# Patient Record
Sex: Male | Born: 1950 | Race: Black or African American | Hispanic: No | Marital: Married | Smoking: Never smoker
Health system: Southern US, Community
[De-identification: ages and names within clinical notes are randomized; demographics above are authoritative.]

## PROBLEM LIST (undated history)

## (undated) DIAGNOSIS — M7130 Other bursal cyst, unspecified site: Secondary | ICD-10-CM

## (undated) DIAGNOSIS — R61 Generalized hyperhidrosis: Secondary | ICD-10-CM

## (undated) DIAGNOSIS — R972 Elevated prostate specific antigen [PSA]: Secondary | ICD-10-CM

## (undated) DIAGNOSIS — N4231 Prostatic intraepithelial neoplasia: Secondary | ICD-10-CM

## (undated) DIAGNOSIS — N400 Enlarged prostate without lower urinary tract symptoms: Secondary | ICD-10-CM

## (undated) HISTORY — DX: Prostatic intraepithelial neoplasia: N42.31

## (undated) HISTORY — DX: Other bursal cyst, unspecified site: M71.30

## (undated) HISTORY — DX: Generalized hyperhidrosis: R61

## (undated) HISTORY — DX: Benign prostatic hyperplasia without lower urinary tract symptoms: N40.0

## (undated) HISTORY — DX: Elevated prostate specific antigen (PSA): R97.20

---

## 2014-06-18 ENCOUNTER — Ambulatory Visit: Payer: Self-pay | Admitting: Family Medicine

## 2014-06-18 IMAGING — CR DG CHEST 2V
1 series · 2 of 2 positions shown · non-contrast
Comparison: None.

CLINICAL DATA: Positive tuberculin test.

EXAM:
CHEST  2 VIEW

[Series 1: w chest pa · 0.14mm/px · 2 of 2 slices shown]
[im 1/2]
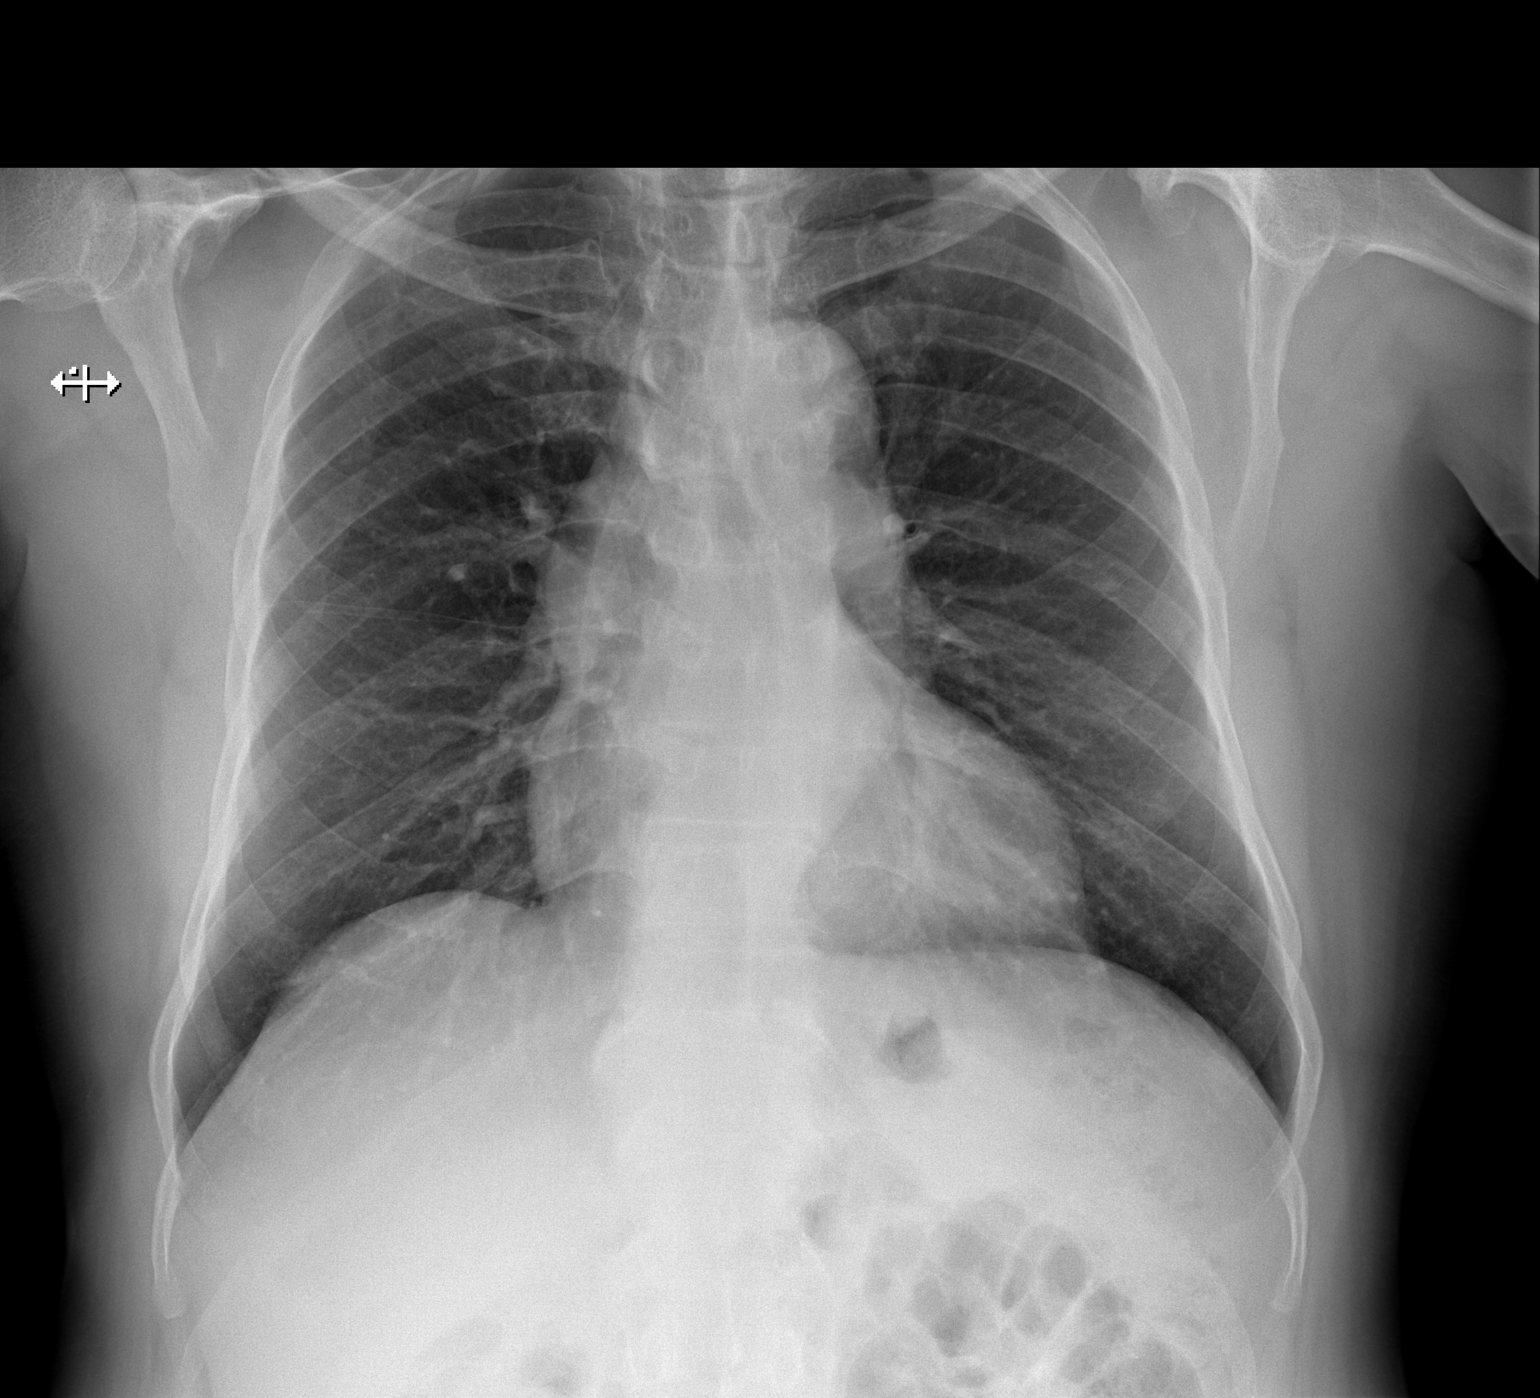
[im 2/2]
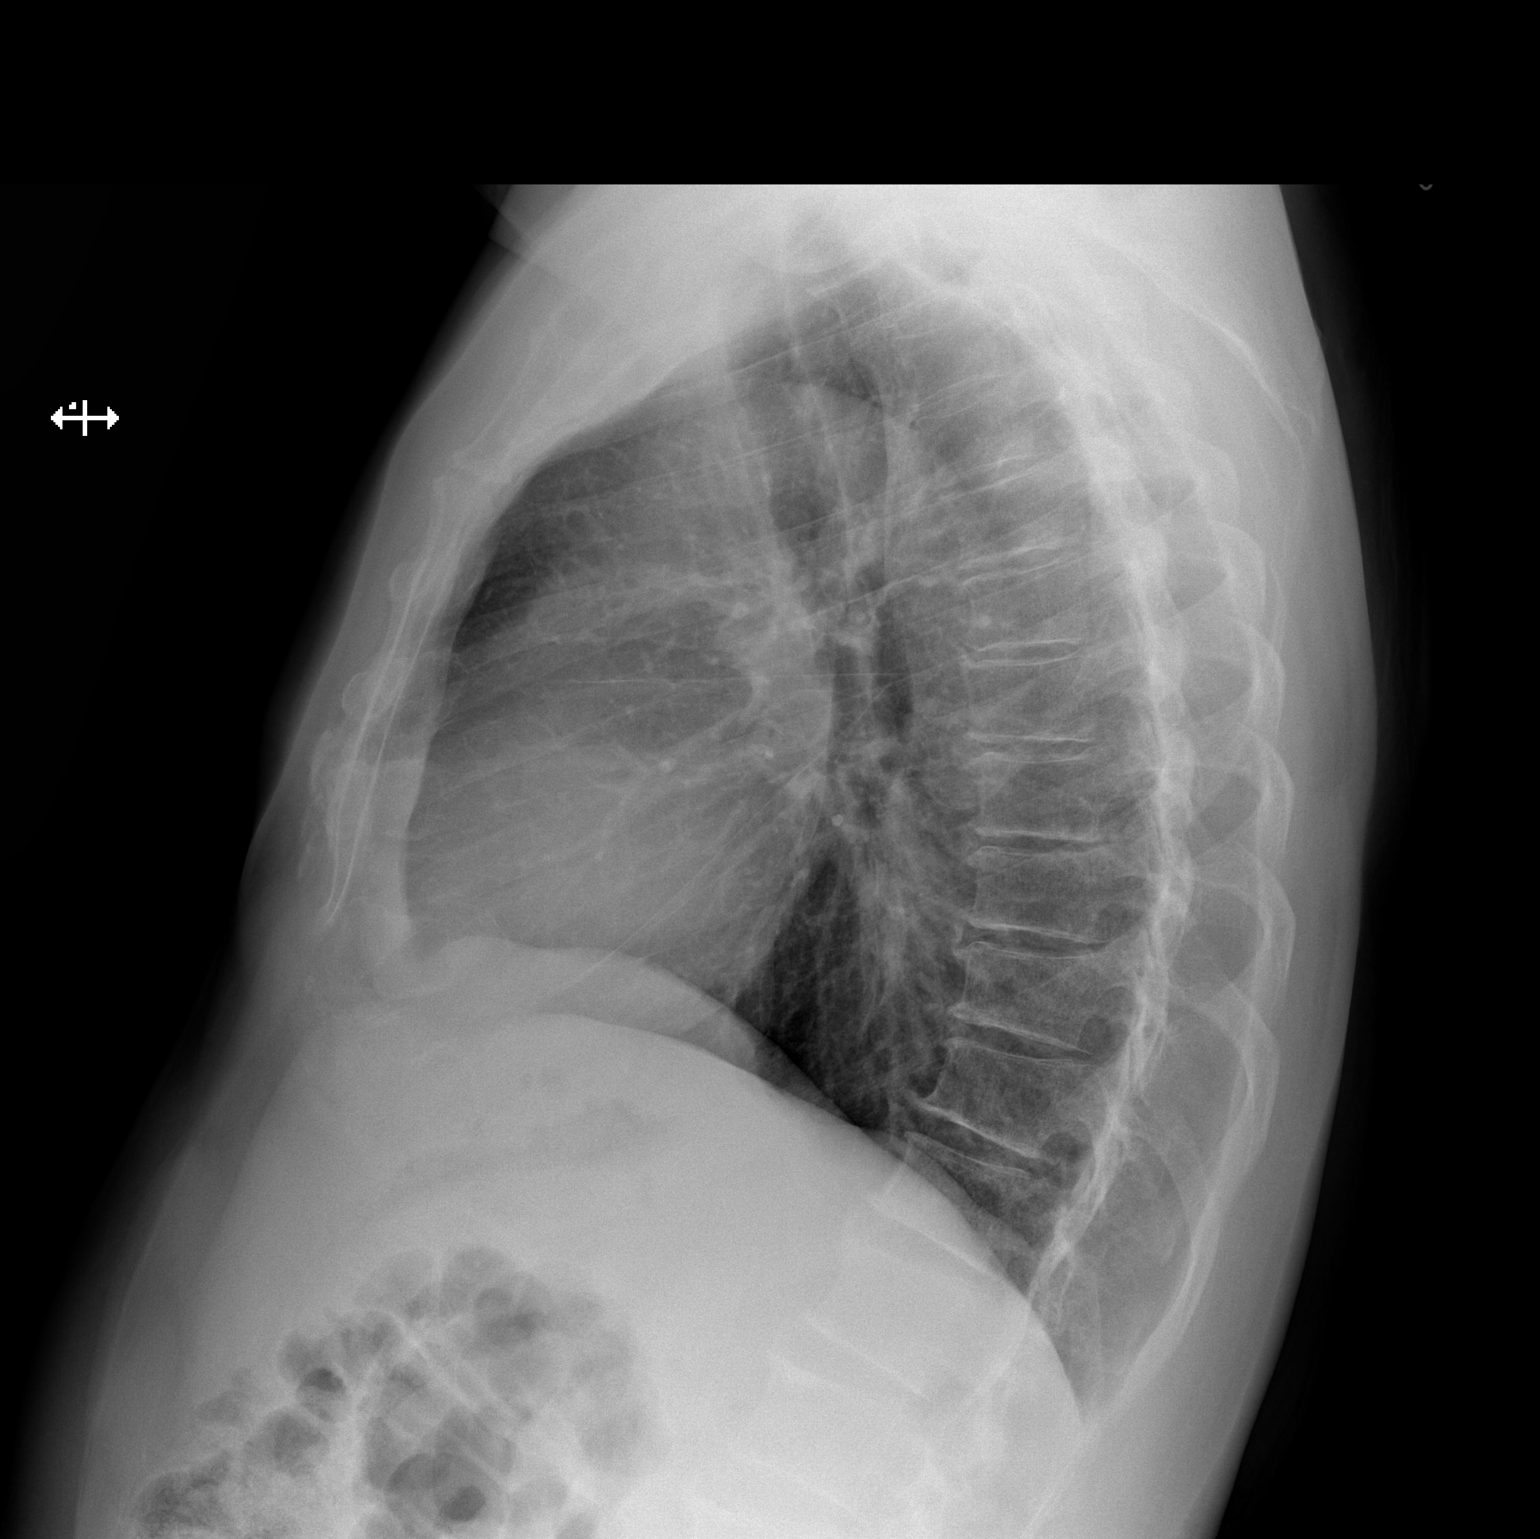

[2 of 2 positions shown; findings below may reference images not displayed]

FINDINGS: The heart size and mediastinal contours are within normal limits.
Both lungs are clear. No pneumothorax or pleural effusion is noted.
The visualized skeletal structures are unremarkable.
IMPRESSION: No acute cardiopulmonary abnormality seen.

## 2015-03-20 DIAGNOSIS — E663 Overweight: Secondary | ICD-10-CM

## 2015-03-20 DIAGNOSIS — N4231 Prostatic intraepithelial neoplasia: Secondary | ICD-10-CM | POA: Insufficient documentation

## 2015-03-20 DIAGNOSIS — R972 Elevated prostate specific antigen [PSA]: Secondary | ICD-10-CM | POA: Insufficient documentation

## 2015-05-03 ENCOUNTER — Encounter: Payer: Self-pay | Admitting: Urology

## 2015-05-03 DIAGNOSIS — M7130 Other bursal cyst, unspecified site: Secondary | ICD-10-CM | POA: Insufficient documentation

## 2015-05-03 DIAGNOSIS — A159 Respiratory tuberculosis unspecified: Secondary | ICD-10-CM | POA: Insufficient documentation

## 2015-05-03 DIAGNOSIS — E669 Obesity, unspecified: Secondary | ICD-10-CM | POA: Insufficient documentation

## 2015-05-03 DIAGNOSIS — E663 Overweight: Secondary | ICD-10-CM

## 2015-05-18 ENCOUNTER — Ambulatory Visit: Payer: 59 | Admitting: Anesthesiology

## 2015-05-18 ENCOUNTER — Encounter
Admission: RE | Disposition: A | Discharge status: Home / Self Care | Payer: Self-pay | Source: Ambulatory Visit | Attending: Gastroenterology

## 2015-05-18 ENCOUNTER — Encounter: Payer: Self-pay | Admitting: Anesthesiology

## 2015-05-18 ENCOUNTER — Ambulatory Visit
Admission: RE | Admit: 2015-05-18 | Discharge: 2015-05-18 | Disposition: A | Discharge status: Home / Self Care | Payer: 59 | Source: Ambulatory Visit | Attending: Gastroenterology | Admitting: Gastroenterology

## 2015-05-18 DIAGNOSIS — Z8611 Personal history of tuberculosis: Secondary | ICD-10-CM | POA: Insufficient documentation

## 2015-05-18 DIAGNOSIS — R600 Localized edema: Secondary | ICD-10-CM | POA: Insufficient documentation

## 2015-05-18 DIAGNOSIS — D124 Benign neoplasm of descending colon: Secondary | ICD-10-CM | POA: Diagnosis not present

## 2015-05-18 DIAGNOSIS — K640 First degree hemorrhoids: Secondary | ICD-10-CM | POA: Insufficient documentation

## 2015-05-18 DIAGNOSIS — Z1211 Encounter for screening for malignant neoplasm of colon: Secondary | ICD-10-CM | POA: Diagnosis not present

## 2015-05-18 DIAGNOSIS — K635 Polyp of colon: Secondary | ICD-10-CM

## 2015-05-18 HISTORY — PX: COLONOSCOPY: SHX5424

## 2015-05-18 SURGERY — COLONOSCOPY
Anesthesia: General

## 2015-05-18 MED ORDER — SODIUM CHLORIDE 0.9 % IV SOLN
INTRAVENOUS | Status: DC
  Administered 2015-05-18: 1000 mL via INTRAVENOUS

## 2015-05-18 MED ORDER — FENTANYL CITRATE (PF) 100 MCG/2ML IJ SOLN
INTRAMUSCULAR | Status: DC | PRN
  Administered 2015-05-18: 50 ug via INTRAVENOUS

## 2015-05-18 MED ORDER — MIDAZOLAM HCL 2 MG/2ML IJ SOLN
INTRAMUSCULAR | Status: DC | PRN
  Administered 2015-05-18: 2 mg via INTRAVENOUS

## 2015-05-18 MED ORDER — PROPOFOL INFUSION 10 MG/ML OPTIME
INTRAVENOUS | Status: DC | PRN
  Administered 2015-05-18: 140 ug/kg/min via INTRAVENOUS

## 2015-05-18 NOTE — H&P (Signed)
  [facility]  [address] [place], [place] [address] Phone: [phone] Fax : [phone]  Primary Care Physician:  Ashok Norris, MD Primary Gastroenterologist:  Dr. Allen Norris  Pre-Procedure History & Physical: HPI:  [name] Hebert a 64 y.o. male Hebert here for a screening colonoscopy.   Past Medical History  Diagnosis Date  . Tuberculosis     NONACTIVE  . Synovial cyst     History reviewed. No pertinent past surgical history.  Prior to Admission medications   Medication Sig Start Date End Date Taking? Authorizing Provider  Multiple Vitamin (MULTIVITAMIN WITH MINERALS) TABS tablet Take 1 tablet by mouth daily.   Yes Historical Provider, MD  Multiple Vitamin (MULTIVITAMIN) capsule Take 1 capsule by mouth daily.    Historical Provider, MD  Multiple Vitamins-Minerals (MENS 50+ MULTI VITAMIN/MIN) TABS Take 1 tablet by mouth daily.    Historical Provider, MD  Multiple Vitamins-Minerals (OCUVITE ADULT 50+ PO) Take by mouth.    Historical Provider, MD  Multiple Vitamins-Minerals (OCUVITE ADULT 50+) CAPS Take 1 tablet by mouth daily.    Historical Provider, MD    Allergies as of 03/29/2015 - never reviewed  Allergen Reaction Noted  . Hydroxychloroquine  03/20/2015    Family History  Problem Relation Age of Onset  . Hyperlipidemia    . Prostate cancer Brother     DECEASED    History   Social History  . Marital Status: Married    Spouse Name: N/A  . Number of Children: N/A  . Years of Education: N/A   Occupational History  . Not on file.   Social History Main Topics  . Smoking status: Never Smoker   . Smokeless tobacco: Not on file  . Alcohol Use: No  . Drug Use: Not on file  . Sexual Activity: Not on file   Other Topics Concern  . Not on file   Social History Narrative    Review of Systems: See HPI, otherwise negative ROS  Physical Exam: BP 154/72 mmHg  Pulse 51  Temp(Src) 98.3 F (36.8 C) (Tympanic)  Resp 16  Ht 5\' 10"  (1.778 m)   Wt 200 lb (90.719 kg)  BMI 28.70 kg/m2  SpO2 100% General:   Alert,  pleasant and cooperative in NAD Head:  Normocephalic and atraumatic. Neck:  Supple; no masses or thyromegaly. Lungs:  Clear throughout to auscultation.    Heart:  Regular rate and rhythm. Abdomen:  Soft, nontender and nondistended. Normal bowel sounds, without guarding, and without rebound.   Neurologic:  Alert and  oriented x4;  grossly normal neurologically.  Impression/Plan: Walter Hebert Hebert now here to undergo a screening colonoscopy.  Risks, benefits, and alternatives regarding colonoscopy have been reviewed with the patient.  Questions have been answered.  All parties agreeable.

## 2015-05-18 NOTE — Anesthesia Postprocedure Evaluation (Signed)
  Anesthesia Post-op Note  Patient: Walter Hebert  Procedure(s) Performed: Procedure(s): COLONOSCOPY (N/A)  Anesthesia type:General  Patient location: PACU  Post pain: Pain level controlled  Post assessment: Post-op Vital signs reviewed, Patient's Cardiovascular Status Stable, Respiratory Function Stable, Patent Airway and No signs of Nausea or vomiting  Post vital signs: Reviewed and stable  Last Vitals:  Filed Vitals:   05/18/15 0912  BP: 90/61  Pulse:   Temp: 35.9 C  Resp:     Level of consciousness: awake, alert  and patient cooperative  Complications: No apparent anesthesia complications

## 2015-05-18 NOTE — Transfer of Care (Signed)
Immediate Anesthesia Transfer of Care Note  Patient: Walter Hebert  Procedure(s) Performed: Procedure(s): COLONOSCOPY (N/A)  Patient Location: PACU  Anesthesia Type:General  Level of Consciousness: awake and sedated  Airway & Oxygen Therapy: Patient Spontanous Breathing  Post-op Assessment: Report given to RN and Post -op Vital signs reviewed and stable  Post vital signs: Reviewed and stable  Last Vitals:  Filed Vitals:   05/18/15 0805  BP: 154/72  Pulse: 51  Temp: 36.8 C  Resp: 16    Complications: No apparent anesthesia complications

## 2015-05-18 NOTE — Anesthesia Procedure Notes (Signed)
Performed by: Vaughan Sine Pre-anesthesia Checklist: Patient identified, Emergency Drugs available, Suction available, Patient being monitored and Timeout performed Patient Re-evaluated:Patient Re-evaluated prior to inductionOxygen Delivery Method: Nasal cannula Intubation Type: IV induction Airway Equipment and Method: Bite block

## 2015-05-18 NOTE — Anesthesia Preprocedure Evaluation (Signed)
Anesthesia Evaluation  Patient identified by MRN, date of birth, ID band Patient awake    Reviewed: Allergy & Precautions, NPO status , Patient's Chart, lab work & pertinent test results, reviewed documented beta blocker date and time   Airway Mallampati: II  TM Distance: >3 FB     Dental  (+) Partial Upper, Chipped   Pulmonary          Cardiovascular     Neuro/Psych    GI/Hepatic   Endo/Other    Renal/GU      Musculoskeletal   Abdominal   Peds  Hematology   Anesthesia Other Findings   Reproductive/Obstetrics                             Anesthesia Physical Anesthesia Plan  ASA: II  Anesthesia Plan: General   Post-op Pain Management:    Induction: Intravenous  Airway Management Planned: Nasal Cannula  Additional Equipment:   Intra-op Plan:   Post-operative Plan:   Informed Consent: I have reviewed the patients History and Physical, chart, labs and discussed the procedure including the risks, benefits and alternatives for the proposed anesthesia with the patient or authorized representative who has indicated his/her understanding and acceptance.     Plan Discussed with: CRNA  Anesthesia Plan Comments:         Anesthesia Quick Evaluation

## 2015-05-18 NOTE — Op Note (Signed)
[facility] Patient Name: Walter Hebert Procedure Date: 05/18/2015 8:52 AM MRN: [id number] Account #: [id number] Date of Birth: August 13, 1951 Admit Type: Outpatient Age: 64 Room: [facility] ENDO ROOM 4 Gender: Male Note Status: Finalized Procedure:         Colonoscopy Indications:       Screening for colorectal malignant neoplasm Providers:         Lucilla Lame, MD Referring MD:      Ashok Norris, MD (Referring MD) Medicines:         Propofol per Anesthesia Complications:     No immediate complications. Procedure:         Pre-Anesthesia Assessment:                    - Prior to the procedure, a History and Physical was                     performed, and patient medications and allergies were                     reviewed. The patient's tolerance of previous anesthesia                     was also reviewed. The risks and benefits of the procedure                     and the sedation options and risks were discussed with the                     patient. All questions were answered, and informed consent                     was obtained. Prior Anticoagulants: The patient has taken                     no previous anticoagulant or antiplatelet agents. ASA                     Grade Assessment: II - A patient with mild systemic                     disease. After reviewing the risks and benefits, the                     patient was deemed in satisfactory condition to undergo                     the procedure.                    After obtaining informed consent, the colonoscope was                     passed under direct vision. Throughout the procedure, the                     patient's blood pressure, pulse, and oxygen saturations                     were monitored continuously. The Colonoscope was                     introduced through the anus and advanced to the the cecum,  identified by appendiceal orifice and ileocecal valve. The          colonoscopy was performed without difficulty. The patient                     tolerated the procedure well. The quality of the bowel                     preparation was excellent. Findings:      The perianal and digital rectal examinations were normal.      A 4 mm polyp was found in the descending colon. The polyp was sessile.       The polyp was removed with a cold biopsy forceps. Resection and       retrieval were complete.      Non-bleeding internal hemorrhoids were found during retroflexion. The       hemorrhoids were Grade I (internal hemorrhoids that do not prolapse). Impression:        - One 4 mm polyp in the descending colon. Resected and                     retrieved.                    - Non-bleeding internal hemorrhoids. Recommendation:    - Repeat colonoscopy in 5 years if polyp adenoma and 10                     years if hyperplastic Procedure Code(s): --- Professional ---                    [phone], Colonoscopy, flexible; with biopsy, single or                     multiple Diagnosis Code(s): --- Professional ---                    Z12.11, Encounter for screening for malignant neoplasm of                     colon                    D12.4, Benign neoplasm of descending colon CPT copyright 2014 American Medical Association. All rights reserved. The codes documented in this report are preliminary and upon coder review may  be revised to meet current compliance requirements. Lucilla Lame, MD 05/18/2015 9:10:02 AM This report has been signed electronically. Number of Addenda: 0 Note Initiated On: 05/18/2015 8:52 AM Scope Withdrawal Time: 0 hours 7 minutes 16 seconds  Total Procedure Duration: 0 hours 10 minutes 9 seconds       [facility]

## 2015-05-19 ENCOUNTER — Ambulatory Visit: Payer: Self-pay | Admitting: Urology

## 2015-05-20 ENCOUNTER — Encounter: Payer: Self-pay | Admitting: Gastroenterology

## 2015-05-20 LAB — SURGICAL PATHOLOGY

## 2015-05-21 DIAGNOSIS — K635 Polyp of colon: Secondary | ICD-10-CM

## 2015-05-26 ENCOUNTER — Telehealth: Payer: Self-pay

## 2015-05-26 NOTE — Telephone Encounter (Signed)
-----   Message from Lucilla Lame, MD sent at 05/21/2015 11:27 AM EDT ----- The patient of the polyp was not precancerous and he will need a repeat colonoscopy in 10 years or sooner if having any rectal bleeding or any other GI problems.

## 2015-05-26 NOTE — Telephone Encounter (Signed)
Pt notified of results

## 2015-06-23 ENCOUNTER — Encounter: Payer: Self-pay | Admitting: Urology

## 2015-06-23 ENCOUNTER — Ambulatory Visit (INDEPENDENT_AMBULATORY_CARE_PROVIDER_SITE_OTHER): Payer: Self-pay | Admitting: Urology

## 2015-06-23 VITALS — BP 118/74 | HR 62 | Ht 70.0 in | Wt 204.8 lb

## 2015-06-23 DIAGNOSIS — R972 Elevated prostate specific antigen [PSA]: Secondary | ICD-10-CM

## 2015-06-23 DIAGNOSIS — N4231 Prostatic intraepithelial neoplasia: Secondary | ICD-10-CM

## 2015-06-23 DIAGNOSIS — N423 Dysplasia of prostate: Secondary | ICD-10-CM

## 2015-06-23 LAB — BLADDER SCAN AMB NON-IMAGING: SCAN RESULT: 0

## 2015-06-23 NOTE — Progress Notes (Signed)
06/23/2015 9:08 AM   Byrant A Hebert 07/28/1951 [id number]  Referring provider: Ashok Norris, MD [address] [place], [place] [address]  Chief Complaint  Patient presents with  . Elevated PSA    follow up    HPI: Mr. Walter Hebert is a 64 year old African male who has a history of elevated PSA and HGPIN.  He presents today for a 6 month follow up.  Patient underwent prostate biopsy on 04/27/2014 for a PSA of 8.3 ng/mL and was found to have a 39 gram prostate and two cores positive for HGPIN.   Today, his IPSS score is 2/1, which is mild symptomatology. His PVR is 0 mL.  He specifically denies any dysuria, suprapubic pain or gross hematuria. He does not have any recent episodes of fevers, chills, nausea or vomiting.  PSA History:    8.3 ng/mL on 12/25/2013    5.2 ng/mL on 11/02/2014      IPSS      06/23/15 0800       International Prostate Symptom Score   How often have you had the sensation of not emptying your bladder? Not at All     How often have you had to urinate less than every two hours? Not at All     How often have you found you stopped and started again several times when you urinated? Not at All     How often have you found it difficult to postpone urination? Less than 1 in 5 times     How often have you had a weak urinary stream? Not at All     How often have you had to strain to start urination? Not at All     How many times did you typically get up at night to urinate? 1 Time     Total IPSS Score 2     Quality of Life due to urinary symptoms   If you were to spend the rest of your life with your urinary condition just the way it is now how would you feel about that? Pleased        Score:  1-7 Mild 8-19 Moderate 20-35 Severe  PMH: Past Medical History  Diagnosis Date  . Tuberculosis     NONACTIVE  . Synovial cyst     Surgical History: Past Surgical History  Procedure Laterality Date  . Colonoscopy N/A 05/18/2015    Procedure:  COLONOSCOPY;  Surgeon: Lucilla Lame, MD;  Location: [facility] ENDOSCOPY;  Service: Endoscopy;  Laterality: N/A;    Home Medications:    Medication List       This list is accurate as of: 06/23/15  9:08 AM.  Always use your most recent med list.               Biotin 1 MG Caps  Take by mouth.     multivitamin capsule  Take 1 capsule by mouth daily.     multivitamin with minerals Tabs tablet  Take 1 tablet by mouth daily.     OCUVITE ADULT 50+ PO  Take by mouth.     MENS 50+ MULTI VITAMIN/MIN Tabs  Take 1 tablet by mouth daily.     OCUVITE ADULT 50+ Caps  Take 1 tablet by mouth daily.     omeprazole 40 MG capsule  Commonly known as:  PRILOSEC  Take by mouth.     triamcinolone cream 0.1 %  Commonly known as:  KENALOG  Apply topically.  Allergies:  Allergies  Allergen Reactions  . Chloroquine Itching  . Hydroxychloroquine Sulfate Hives    Family History: Family History  Problem Relation Age of Onset  . Hyperlipidemia    . Prostate cancer Brother     DECEASED  . Kidney disease Neg Hx     Social History:  reports that he has quit smoking. He does not have any smokeless tobacco history on file. He reports that he does not drink alcohol or use illicit drugs.  ROS: Urological Symptom Review  Patient is experiencing the following symptoms: Hard to postpone urination Get up at night to urinate   Review of Systems  Gastrointestinal (upper)  : Negative for upper GI symptoms  Gastrointestinal (lower) : Negative for lower GI symptoms  Constitutional : Negative for symptoms  Skin: Negative for skin symptoms  Eyes: Negative for eye symptoms  Ear/Nose/Throat : Sinus problems  Hematologic/Lymphatic: Negative for Hematologic/Lymphatic symptoms  Cardiovascular : Negative for cardiovascular symptoms  Respiratory : Negative for respiratory symptoms  Endocrine: Negative for endocrine symptoms  Musculoskeletal: Negative for musculoskeletal  symptoms  Neurological: Negative for neurological symptoms  Psychologic: Negative for psychiatric symptoms   Physical Exam: BP 118/74 mmHg  Pulse 62  Ht 5\' 10"  (1.778 m)  Wt 204 lb 12.8 oz (92.897 kg)  BMI 29.39 kg/m2  GU: Patient with circumcised phallus.  Urethral meatus is patent.  No penile discharge. No penile lesions or rashes. Scrotum without lesions, cysts, rashes and/or edema.  Testicles are located scrotally bilaterally. No masses are appreciated in the testicles. Left and right epididymis are normal. Rectal: Patient with  normal sphincter tone. Perineum without scarring or rashes. No rectal masses are appreciated. Prostate is approximately 60 grams, no nodules are appreciated. Seminal vesicles are normal.   Laboratory Data: Results for orders placed or performed in visit on 06/23/15  BLADDER SCAN AMB NON-IMAGING  Result Value Ref Range   Scan Result 0    No results found for: WBC, HGB, HCT, MCV, PLT  No results found for: CREATININE  No results found for: PSA  No results found for: TESTOSTERONE  No results found for: HGBA1C  Urinalysis No results found for: COLORURINE, APPEARANCEUR, LABSPEC, PHURINE, GLUCOSEU, HGBUR, BILIRUBINUR, KETONESUR, PROTEINUR, UROBILINOGEN, NITRITE, LEUKOCYTESUR  Pertinent Imaging:   Assessment & Plan:    1. Elevated PSA:   Patient with elevated PSA's.  We will continue to monitor closely. He'll return in 6 months for IPS S score, DRE, PVR and PSA.  - PSA   - BLADDER SCAN AMB NON-IMAGING   2. 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  PSA at time of biopsy was 8.3 ng/mL. We will continue to monitor closely. He'll return in 6 months for PSA and DRE.  No Follow-up on file.  Zara Council, [facility] [address] [place], [place] [address] [phone]

## 2015-06-24 ENCOUNTER — Telehealth: Payer: Self-pay

## 2015-06-24 DIAGNOSIS — R972 Elevated prostate specific antigen [PSA]: Secondary | ICD-10-CM

## 2015-06-24 LAB — PSA: PROSTATE SPECIFIC AG, SERUM: 6.3 ng/mL — AB (ref 0.0–4.0)

## 2015-06-24 NOTE — Telephone Encounter (Signed)
Spoke with pt in reference to PSA. Pt voiced understanding. Pt is coming 09/22/15 @8 :30 for blood draw. Cw,lpn

## 2015-06-24 NOTE — Telephone Encounter (Signed)
-----   Message from Nori Riis, PA-C sent at 06/24/2015  8:29 AM EDT ----- [name] is elevated.  I would like to repeat it again in 3 months.

## 2015-09-22 ENCOUNTER — Other Ambulatory Visit: Payer: 59

## 2015-09-22 DIAGNOSIS — R972 Elevated prostate specific antigen [PSA]: Secondary | ICD-10-CM

## 2015-09-23 ENCOUNTER — Telehealth: Payer: Self-pay

## 2015-09-23 LAB — PSA: PROSTATE SPECIFIC AG, SERUM: 4.5 ng/mL — AB (ref 0.0–4.0)

## 2015-09-23 NOTE — Telephone Encounter (Signed)
-----   Message from Nori Riis, PA-C sent at 09/23/2015  8:22 AM EDT ----- [name] has decreased.  He has an appointment in January and will recheck again at that time.

## 2015-09-23 NOTE — Telephone Encounter (Signed)
Spoke with pt in reference to PSA results. Pt voiced understanding.  

## 2015-12-17 ENCOUNTER — Other Ambulatory Visit: Payer: 59

## 2015-12-17 DIAGNOSIS — R972 Elevated prostate specific antigen [PSA]: Secondary | ICD-10-CM

## 2015-12-18 LAB — PSA: Prostate Specific Ag, Serum: 4.5 ng/mL — ABNORMAL HIGH (ref 0.0–4.0)

## 2015-12-24 ENCOUNTER — Encounter: Payer: Self-pay | Admitting: Urology

## 2015-12-24 ENCOUNTER — Ambulatory Visit (INDEPENDENT_AMBULATORY_CARE_PROVIDER_SITE_OTHER): Payer: BLUE CROSS/BLUE SHIELD | Admitting: Urology

## 2015-12-24 VITALS — BP 138/81 | HR 62 | Ht 70.0 in | Wt 203.3 lb

## 2015-12-24 DIAGNOSIS — N4231 Prostatic intraepithelial neoplasia: Secondary | ICD-10-CM

## 2015-12-24 DIAGNOSIS — N138 Other obstructive and reflux uropathy: Secondary | ICD-10-CM | POA: Insufficient documentation

## 2015-12-24 DIAGNOSIS — N401 Enlarged prostate with lower urinary tract symptoms: Secondary | ICD-10-CM

## 2015-12-24 DIAGNOSIS — R972 Elevated prostate specific antigen [PSA]: Secondary | ICD-10-CM

## 2015-12-24 MED ORDER — FINASTERIDE 5 MG PO TABS: 5.0000 mg | ORAL_TABLET | Freq: Every day | ORAL | Status: DC

## 2015-12-24 MED ORDER — TAMSULOSIN HCL 0.4 MG PO CAPS: 0.4000 mg | ORAL_CAPSULE | Freq: Every day | ORAL | Status: DC

## 2015-12-24 NOTE — Progress Notes (Signed)
8:51 AM   Walter Hebert 04/25/1951 [id number]  Referring provider: Ashok Norris, MD [address] [place], [place] [address]  Chief Complaint  Patient presents with  . Elevated PSA    6 month follow up    HPI: Patient is a 65 year old [place] male  with a history of elevated PSA, HGPIN and BPH with LUTS who presents today for his 6 month follow up.    Elevated PSA [name] was found to be 8.3 on 12/25/2013.  His most current PSA is 4.5 on 12/17/2015.  His PSA has reduced without intervention or medications.  His brother was diagnosed with fatal prostate cancer.    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   BPH WITH LUTS His IPSS score today is 5, which is mild lower urinary tract symptomatology. He is mixed with his quality life due to his urinary symptoms.   His previous IPSS score was 2/1 in 06/2015.   His major complaint today urgency of urination.  He has had this symptom for one year.  He denies any dysuria, hematuria or suprapubic pain.   He also denies any recent fevers, chills, nausea or vomiting.  He has a family history of PCa, with his brother with fatal prostate cancer.         IPSS      12/24/15 0800       International Prostate Symptom Score   How often have you had the sensation of not emptying your bladder? Not at All     How often have you had to urinate less than every two hours? Less than 1 in 5 times     How often have you found you stopped and started again several times when you urinated? Not at All     How often have you found it difficult to postpone urination? About half the time     How often have you had a weak urinary stream? Not at All     How often have you had to strain to start urination? Not at All     How many times did you typically get up at night to urinate? 1 Time     Total IPSS Score 5     Quality of Life  due to urinary symptoms   If you were to spend the rest of your life with your urinary condition just the way it is now how would you feel about that? Mixed        Score:  1-7 Mild 8-19 Moderate 20-35 Severe       PMH: Past Medical History  Diagnosis Date  . Tuberculosis     NONACTIVE  . Synovial cyst   . High grade prostatic intraepithelial neoplasia     found in two cores on biopsy may 2015  . Overweight   . Elevated PSA   . Enlarged prostate     Surgical History: Past Surgical History  Procedure Laterality Date  . Colonoscopy N/A 05/18/2015    Procedure: COLONOSCOPY;  Surgeon: Lucilla Lame, MD;  Location: [facility] ENDOSCOPY;  Service: Endoscopy;  Laterality: N/A;    Home Medications:    Medication List       This list is accurate as of: 12/24/15  8:51 AM.  Always use your most recent med list.  Biotin 1 MG Caps  Take by mouth. Reported on 12/24/2015     finasteride 5 MG tablet  Commonly known as:  PROSCAR  Take 1 tablet (5 mg total) by mouth daily.     multivitamin capsule  Take 1 capsule by mouth daily. Reported on 12/24/2015     omeprazole 40 MG capsule  Commonly known as:  PRILOSEC  Take by mouth. Reported on 12/24/2015     tamsulosin 0.4 MG Caps capsule  Commonly known as:  FLOMAX  Take 1 capsule (0.4 mg total) by mouth daily.     triamcinolone cream 0.1 %  Commonly known as:  KENALOG  Apply topically. Reported on 12/24/2015     VITAMIN C PO  Take by mouth.     VITAMIN E PO  Take by mouth.        Allergies:  Allergies  Allergen Reactions  . Chloroquine Itching  . Hydroxychloroquine Sulfate Hives    Family History: Family History  Problem Relation Age of Onset  . Hyperlipidemia    . Prostate cancer Brother     DECEASED  . Kidney disease Neg Hx     Social History:  reports that he has quit smoking. He does not have any smokeless tobacco history on file. He reports that he does not drink alcohol or use illicit  drugs.  ROS: Urological Symptom Review  Patient is experiencing the following symptoms: Hard to postpone urination Get up at night to urinate   Review of Systems  Gastrointestinal (upper)  : Negative for upper GI symptoms  Gastrointestinal (lower) : Negative for lower GI symptoms  Constitutional : Negative for symptoms  Skin: Negative for skin symptoms  Eyes: Negative for eye symptoms  Ear/Nose/Throat : Sinus problems  Hematologic/Lymphatic: Negative for Hematologic/Lymphatic symptoms  Cardiovascular : Negative for cardiovascular symptoms  Respiratory : Negative for respiratory symptoms  Endocrine: Negative for endocrine symptoms  Musculoskeletal: Negative for musculoskeletal symptoms  Neurological: Negative for neurological symptoms  Psychologic: Negative for psychiatric symptoms   Physical Exam: BP 138/81 mmHg  Pulse 62  Ht 5\' 10"  (1.778 m)  Wt 203 lb 4.8 oz (92.216 kg)  BMI 29.17 kg/m2  GU: Patient with circumcised phallus.  Urethral meatus is patent.  No penile discharge. No penile lesions or rashes. Scrotum without lesions, cysts, rashes and/or edema.  Testicles are located scrotally bilaterally. No masses are appreciated in the testicles. Left and right epididymis are normal. Rectal: Patient with  normal sphincter tone. Perineum without scarring or rashes. No rectal masses are appreciated. Prostate is approximately 60 grams, no nodules are appreciated. Seminal vesicles are normal.   Laboratory Data: PSA History:    8.3 ng/mL on 12/25/2013    5.2 ng/mL on 11/02/2014   Results for orders placed or performed in visit on 12/17/15  PSA  Result Value Ref Range   Prostate Specific Ag, Serum 4.5 (H) 0.0 - 4.0 ng/mL   Lab Results  Component Value Date   PSA 4.5* 12/17/2015   PSA 4.5* 09/22/2015   PSA 6.3* 06/23/2015    Assessment & Plan:    1. Elevated PSA:   Patient with elevated PSA's.  We will continue to monitor closely. He'll return  in 3 months for a PSA.  2. 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  PSA at time of biopsy was 8.3 ng/mL. We will continue to monitor closely. He'll return in 3 months for PSA.  3. BPH with LUTS:  Patient's IPSS score is 5/3.   I discussed the treatment options for BPH, such as: observation over time with prn avoidance of alcohol/caffeine; medical treatment or TURP.  Patient like to pursue medical treatment.  I will start tamsulosin and finasteride.  He is advised to take tamsulosin  30 minutes after a meal.  I advised him of the side effects, such as: retrograde ejaculation, sinus congestion, nasal congestion, rhinorrhea, rhinitis, dizziness, and seasonal allergic rhinitis.  The side effects of finasteride are also discussed with the patient, such as: impotence, loss of interest in sex, or trouble having an orgasm; abnormal ejaculation; swelling in his hands and/or feet; swelling and/or tenderness in his breasts.  He will follow up in 3 months for a PSA, PVR and an IPSS.    Return in about 3 months (around 03/23/2016) for IPSS and PVR.  Zara Council, [facility] [address] [place], [place] [address] [phone]

## 2016-01-12 ENCOUNTER — Ambulatory Visit (INDEPENDENT_AMBULATORY_CARE_PROVIDER_SITE_OTHER): Payer: BLUE CROSS/BLUE SHIELD | Admitting: Urology

## 2016-01-12 ENCOUNTER — Encounter: Payer: Self-pay | Admitting: Urology

## 2016-01-12 VITALS — BP 111/72 | HR 64 | Ht 70.0 in | Wt 204.5 lb

## 2016-01-12 DIAGNOSIS — G479 Sleep disorder, unspecified: Secondary | ICD-10-CM | POA: Diagnosis not present

## 2016-01-12 LAB — URINALYSIS, COMPLETE
Bilirubin, UA: NEGATIVE
Glucose, UA: NEGATIVE
Ketones, UA: NEGATIVE
Nitrite, UA: NEGATIVE
PH UA: 5.5 (ref 5.0–7.5)
Specific Gravity, UA: 1.025 (ref 1.005–1.030)
UUROB: 0.2 mg/dL (ref 0.2–1.0)

## 2016-01-12 LAB — MICROSCOPIC EXAMINATION: Epithelial Cells (non renal): NONE SEEN /hpf (ref 0–10)

## 2016-01-15 DIAGNOSIS — G4700 Insomnia, unspecified: Secondary | ICD-10-CM | POA: Insufficient documentation

## 2016-01-15 NOTE — Progress Notes (Signed)
10:22 PM   Walter Hebert Mar 08, 1951 [id number]  Referring provider: Ashok Norris, MD [address] [place], [place] [address]  Chief Complaint  Patient presents with  . Medication Reaction    Patient states can't sleep thinks its the Finasteride    HPI: Patient is a 65 year old [place] male  who was started on finasteride at his last visit approximately 2 weeks ago and is finding it difficult to sleep. He is wondering if it could be the finasteride.  Patient states that initially when he started the finasteride he was being awakened by the urge to urinate. That has since passed and now he is finding it difficult to fall asleep.  He lays in bed for 2 hours before drifting off to sleep. He then sleeps for 2-3 hours and wakes up.  The urge to urinate is no longer causing him to wake up.  His UA is unremarkable on today's exam.  Elevated PSA [name] was found to be 8.3 on 12/25/2013.  His most current PSA is 4.5 on 12/17/2015.  His PSA has reduced without intervention or medications.  His brother was diagnosed with fatal prostate cancer.    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   BPH WITH LUTS His IPSS score today is 5, which is mild lower urinary tract symptomatology. He is mixed with his quality life due to his urinary symptoms.   His previous IPSS score was 2/1 in 06/2015.   His major complaint today urgency of urination.  He has had this symptom for one year.  He denies any dysuria, hematuria or suprapubic pain.   He also denies any recent fevers, chills, nausea or vomiting.  He has a family history of PCa, with his brother with fatal prostate cancer.         IPSS      12/24/15 0800       International Prostate Symptom Score   How often have you had the sensation of not emptying your bladder? Not at All     How often have you had to urinate  less than every two hours? Less than 1 in 5 times     How often have you found you stopped and started again several times when you urinated? Not at All     How often have you found it difficult to postpone urination? About half the time     How often have you had a weak urinary stream? Not at All     How often have you had to strain to start urination? Not at All     How many times did you typically get up at night to urinate? 1 Time     Total IPSS Score 5     Quality of Life due to urinary symptoms   If you were to spend the rest of your life with your urinary condition just the way it is now how would you feel about that? Mixed        Score:  1-7 Mild 8-19 Moderate 20-35 Severe       PMH: Past Medical History  Diagnosis Date  . Tuberculosis     NONACTIVE  . Synovial cyst   . High grade prostatic intraepithelial neoplasia     found in two cores on biopsy may 2015  . Overweight   .  Elevated PSA   . Enlarged prostate     Surgical History: Past Surgical History  Procedure Laterality Date  . Colonoscopy N/A 05/18/2015    Procedure: COLONOSCOPY;  Surgeon: Lucilla Lame, MD;  Location: [facility] ENDOSCOPY;  Service: Endoscopy;  Laterality: N/A;    Home Medications:    Medication List       This list is accurate as of: 01/12/16 11:59 PM.  Always use your most recent med list.               Biotin 1 MG Caps  Take by mouth. Reported on 01/12/2016     finasteride 5 MG tablet  Commonly known as:  PROSCAR  Take 1 tablet (5 mg total) by mouth daily.     multivitamin capsule  Take 1 capsule by mouth daily. Reported on 12/24/2015     omeprazole 40 MG capsule  Commonly known as:  PRILOSEC  Take by mouth. Reported on 01/12/2016     tamsulosin 0.4 MG Caps capsule  Commonly known as:  FLOMAX  Take 1 capsule (0.4 mg total) by mouth daily.     triamcinolone cream 0.1 %  Commonly known as:  KENALOG  Apply topically. Reported on 01/12/2016     VITAMIN C PO  Take by mouth.       VITAMIN E PO  Take by mouth.        Allergies:  Allergies  Allergen Reactions  . Chloroquine Itching  . Hydroxychloroquine Sulfate Hives    Family History: Family History  Problem Relation Age of Onset  . Hyperlipidemia    . Prostate cancer Brother     DECEASED  . Kidney disease Neg Hx     Social History:  reports that he has quit smoking. He does not have any smokeless tobacco history on file. He reports that he does not drink alcohol or use illicit drugs.  ROS: Urological Symptom Review  Patient is experiencing the following symptoms: Hard to postpone urination Get up at night to urinate   Review of Systems  Gastrointestinal (upper)  : Negative for upper GI symptoms  Gastrointestinal (lower) : Negative for lower GI symptoms  Constitutional : Negative for symptoms  Skin: Negative for skin symptoms  Eyes: Negative for eye symptoms  Ear/Nose/Throat : Sinus problems  Hematologic/Lymphatic: Negative for Hematologic/Lymphatic symptoms  Cardiovascular : Negative for cardiovascular symptoms  Respiratory : Negative for respiratory symptoms  Endocrine: Negative for endocrine symptoms  Musculoskeletal: Negative for musculoskeletal symptoms  Neurological: Negative for neurological symptoms  Psychologic: Negative for psychiatric symptoms   Physical Exam: BP 111/72 mmHg  Pulse 64  Ht 5\' 10"  (1.778 m)  Wt 204 lb 8 oz (92.761 kg)  BMI 29.34 kg/m2  GU: Patient with circumcised phallus.  Urethral meatus is patent.  No penile discharge. No penile lesions or rashes. Scrotum without lesions, cysts, rashes and/or edema.  Testicles are located scrotally bilaterally. No masses are appreciated in the testicles. Left and right epididymis are normal. Rectal: Patient with  normal sphincter tone. Perineum without scarring or rashes. No rectal masses are appreciated. Prostate is approximately 60 grams, no nodules are appreciated. Seminal vesicles are  normal.   Laboratory Data: PSA History:    8.3 ng/mL on 12/25/2013    5.2 ng/mL on 11/02/2014    6.3 ng/mL on 06/23/2015    4.5 ng/mL on 09/22/2015    4.5 ng/mL on 12/17/2015   Results for orders placed or performed in visit on 01/12/16  Microscopic Examination  Result  Value Ref Range   WBC, UA 6-10 (A) 0 -  5 /hpf   RBC, UA 0-2 0 -  2 /hpf   Epithelial Cells (non renal) None seen 0 - 10 /hpf   Mucus, UA Present (A) Not Estab.   Bacteria, UA Few (A) None seen/Few  Urinalysis, Complete  Result Value Ref Range   Specific Gravity, UA 1.025 1.005 - 1.030   pH, UA 5.5 5.0 - 7.5   Color, UA Yellow Yellow   Appearance Ur Clear Clear   Leukocytes, UA Trace (A) Negative   Protein, UA 1+ (A) Negative/Trace   Glucose, UA Negative Negative   Ketones, UA Negative Negative   RBC, UA Trace (A) Negative   Bilirubin, UA Negative Negative   Urobilinogen, Ur 0.2 0.2 - 1.0 mg/dL   Nitrite, UA Negative Negative   Microscopic Examination See below:      Assessment & Plan:    1. Sleep difficulty:   Patient is having trouble initiating sleep and staying asleep. He is taking his finasteride in the evening. He will have a trial taking the finasteride in the morning and he will contact me in one week with the results.  2. Elevated PSA:   Patient with elevated PSA's.  We will continue to monitor closely. He'll return in 3 months for a PSA.  3. 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  PSA at time of biopsy was 8.3 ng/mL. We will continue to monitor closely. He'll return in 3 months for PSA.  4. BPH with LUTS:    Patient's IPSS score is 5/3.   I discussed the treatment options for BPH, such as: observation over time with prn avoidance of alcohol/caffeine; medical treatment or TURP.  Patient like to pursue medical treatment.  I will start tamsulosin and finasteride.  He is advised to take tamsulosin  30 minutes after a meal.  I advised him of the side effects, such as: retrograde  ejaculation, sinus congestion, nasal congestion, rhinorrhea, rhinitis, dizziness, and seasonal allergic rhinitis.  The side effects of finasteride are also discussed with the patient, such as: impotence, loss of interest in sex, or trouble having an orgasm; abnormal ejaculation; swelling in his hands and/or feet; swelling and/or tenderness in his breasts.  He will follow up in 3 months for a PSA, PVR and an IPSS.    Return for patient to call in one week .  Zara Council, [facility] [address] [place],  [address] [phone]

## 2016-01-20 ENCOUNTER — Telehealth: Payer: Self-pay | Admitting: Urology

## 2016-01-20 NOTE — Telephone Encounter (Signed)
Patient wanted you to know that he is able to sleep now  michelle

## 2016-01-20 NOTE — Telephone Encounter (Signed)
That is great news

## 2016-03-24 ENCOUNTER — Ambulatory Visit: Payer: BLUE CROSS/BLUE SHIELD | Admitting: Urology

## 2016-04-20 ENCOUNTER — Encounter: Payer: Self-pay | Admitting: Urology

## 2016-04-20 ENCOUNTER — Ambulatory Visit (INDEPENDENT_AMBULATORY_CARE_PROVIDER_SITE_OTHER): Payer: BLUE CROSS/BLUE SHIELD | Admitting: Urology

## 2016-04-20 VITALS — BP 149/77 | HR 51 | Ht 70.0 in | Wt 211.1 lb

## 2016-04-20 DIAGNOSIS — R972 Elevated prostate specific antigen [PSA]: Secondary | ICD-10-CM | POA: Diagnosis not present

## 2016-04-20 DIAGNOSIS — N4231 Prostatic intraepithelial neoplasia: Secondary | ICD-10-CM

## 2016-04-20 DIAGNOSIS — N401 Enlarged prostate with lower urinary tract symptoms: Secondary | ICD-10-CM | POA: Diagnosis not present

## 2016-04-20 DIAGNOSIS — N138 Other obstructive and reflux uropathy: Secondary | ICD-10-CM

## 2016-04-20 MED ORDER — FINASTERIDE 5 MG PO TABS: 5.0000 mg | ORAL_TABLET | Freq: Every day | ORAL | Status: DC

## 2016-04-20 MED ORDER — TAMSULOSIN HCL 0.4 MG PO CAPS: 0.4000 mg | ORAL_CAPSULE | Freq: Every day | ORAL | Status: DC

## 2016-04-20 NOTE — Progress Notes (Signed)
8:51 AM   Walter Hebert [birth year] [id number]  Referring provider: Ashok Norris, MD [address] [place], [place] [address]  Chief Complaint  Patient presents with  . Elevated PSA    3 month follow up  . Benign Prostatic Hypertrophy    HPI: Patient is a 65 year old [place] male  who presents today for a three month follow up after starting finasteride in addition to his tamsulosin.        Elevated PSA [name] was found to be 8.3 on 12/25/2013.  His most current PSA is 4.5 on 12/17/2015.  His PSA has reduced without intervention or medications.  His brother was diagnosed with fatal prostate cancer.  A PSA is repeated today.  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 He is being monitored with biannual exams and PSA's.  BPH WITH LUTS His IPSS score today is 3, which is mild lower urinary tract symptomatology. He is pleased with his quality life due to his urinary symptoms.   His previous IPSS score was 5/3 on 12/2015.   His major complaint today urgency of urination.  He has had this symptom for one year.  He is currently on tamsulosin and finasteride.   He takes the finasteride in the morning so that it does not disrupt his sleep. He denies any dysuria, hematuria or suprapubic pain.   He also denies any recent fevers, chills, nausea or vomiting.  He has a family history of PCa, with his brother with fatal prostate cancer.         IPSS      04/20/16 0800       International Prostate Symptom Score   How often have you had the sensation of not emptying your bladder? Not at All     How often have you had to urinate less than every two hours? Less than 1 in 5 times     How often have you found you stopped and started again several times when you urinated? Not at All     How often have you found it difficult to postpone urination? Less than 1 in 5  times     How often have you had a weak urinary stream? Not at All     How often have you had to strain to start urination? Not at All     How many times did you typically get up at night to urinate? 1 Time     Total IPSS Score 3     Quality of Life due to urinary symptoms   If you were to spend the rest of your life with your urinary condition just the way it is now how would you feel about that? Pleased        Score:  1-7 Mild 8-19 Moderate 20-35 Severe    PMH: Past Medical History  Diagnosis Date  . Tuberculosis     NONACTIVE  . Synovial cyst   . High grade prostatic intraepithelial neoplasia     found in two cores on biopsy may 2015  . Overweight   . Elevated PSA   . Enlarged prostate     Surgical History: Past Surgical History  Procedure Laterality Date  . Colonoscopy N/A 05/18/2015    Procedure: COLONOSCOPY;  Surgeon: Lucilla Lame, MD;  Location: [facility] ENDOSCOPY;  Service: Endoscopy;  Laterality:  N/A;    Home Medications:    Medication List       This list is accurate as of: 04/20/16  8:51 AM.  Always use your most recent med list.               Biotin 1 MG Caps  Take by mouth. Reported on 04/20/2016     finasteride 5 MG tablet  Commonly known as:  PROSCAR  Take 1 tablet (5 mg total) by mouth daily.     multivitamin capsule  Take 1 capsule by mouth daily. Reported on 12/24/2015     omeprazole 40 MG capsule  Commonly known as:  PRILOSEC  Take by mouth. Reported on 04/20/2016     tamsulosin 0.4 MG Caps capsule  Commonly known as:  FLOMAX  Take 1 capsule (0.4 mg total) by mouth daily.     triamcinolone cream 0.1 %  Commonly known as:  KENALOG  Apply topically. Reported on 04/20/2016     VITAMIN C PO  Take by mouth.     VITAMIN E PO  Take by mouth.        Allergies:  Allergies  Allergen Reactions  . Chloroquine Itching  . Hydroxychloroquine Sulfate Hives    Family History: Family History  Problem Relation Age of Onset  . Hyperlipidemia      . Prostate cancer Brother     DECEASED  . Kidney disease Neg Hx     Social History:  reports that he has quit smoking. He does not have any smokeless tobacco history on file. He reports that he does not drink alcohol or use illicit drugs.  ROS: Urological Symptom Review  Patient is experiencing the following symptoms: Hard to postpone urination Get up at night to urinate   Review of Systems  Gastrointestinal (upper)  : Negative for upper GI symptoms  Gastrointestinal (lower) : Negative for lower GI symptoms  Constitutional : Negative for symptoms  Skin: Negative for skin symptoms  Eyes: Negative for eye symptoms  Ear/Nose/Throat : Sinus problems  Hematologic/Lymphatic: Negative for Hematologic/Lymphatic symptoms  Cardiovascular : Negative for cardiovascular symptoms  Respiratory : Negative for respiratory symptoms  Endocrine: Negative for endocrine symptoms  Musculoskeletal: Negative for musculoskeletal symptoms  Neurological: Negative for neurological symptoms  Psychologic: Negative for psychiatric symptoms   Physical Exam: BP 149/77 mmHg  Pulse 51  Ht 5\' 10"  (1.778 m)  Wt 211 lb 1.6 oz (95.754 kg)  BMI 30.29 kg/m2  Rectal: Patient with  normal sphincter tone. Perineum without scarring or rashes. No rectal masses are appreciated. Prostate is approximately 60 grams, no nodules are appreciated. Seminal vesicles are normal.   Laboratory Data: PSA History:    8.3 ng/mL on 12/25/2013    5.2 ng/mL on 11/02/2014    6.3 ng/mL on 06/23/2015    4.5 ng/mL on 09/22/2015    4.5 ng/mL on 12/17/2015   Assessment & Plan:    1. Elevated PSA:   Patient with elevated PSA's.  We will continue to monitor closely.  PSA is drawn today. Follow-up will be pending PSA results.    2. 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  PSA at time of biopsy was 8.3 ng/mL. We will continue to monitor closely.  Follow-up will be pending PSA results.    3. BPH  with LUTS:    Patient's IPSS score is 3/1.  He will continue the tamsulosin and finasteride.  He will follow up in 6 months  for a PSA and an IPSS if PSA returns stable or lower.  If it returns elevated, we may need to consider a biopsy or MRI of the prostate.   Return in about 6 months (around 10/21/2016) for IPSS and exam.  Zara Council, [facility] [address], [place], [place] [address] [phone]

## 2016-04-21 ENCOUNTER — Telehealth: Payer: Self-pay

## 2016-04-21 LAB — PSA: Prostate Specific Ag, Serum: 3.5 ng/mL (ref 0.0–4.0)

## 2016-04-21 NOTE — Telephone Encounter (Signed)
Spoke with pt in reference to PSA results. Pt voiced understanding.  

## 2016-04-21 NOTE — Telephone Encounter (Signed)
-----   Message from Nori Riis, PA-C sent at 04/21/2016  8:07 AM EDT ----- PSA is stable.  We will see him in October.

## 2016-09-19 ENCOUNTER — Encounter: Payer: Self-pay | Admitting: Family Medicine

## 2016-09-19 ENCOUNTER — Ambulatory Visit (INDEPENDENT_AMBULATORY_CARE_PROVIDER_SITE_OTHER): Payer: BLUE CROSS/BLUE SHIELD | Admitting: Family Medicine

## 2016-09-19 VITALS — BP 140/78 | HR 64 | Temp 97.9°F | Wt 209.1 lb

## 2016-09-19 DIAGNOSIS — K5900 Constipation, unspecified: Secondary | ICD-10-CM | POA: Insufficient documentation

## 2016-09-19 DIAGNOSIS — K5909 Other constipation: Secondary | ICD-10-CM | POA: Diagnosis not present

## 2016-09-19 DIAGNOSIS — Z23 Encounter for immunization: Secondary | ICD-10-CM

## 2016-09-19 LAB — CBC WITH DIFFERENTIAL/PLATELET
Basophils Absolute: 38 cells/uL (ref 0–200)
Basophils Relative: 1 %
EOS ABS: 152 {cells}/uL (ref 15–500)
Eosinophils Relative: 4 %
HEMATOCRIT: 41.9 % (ref 38.5–50.0)
Hemoglobin: 14.1 g/dL (ref 13.2–17.1)
Lymphocytes Relative: 52 %
Lymphs Abs: 1976 cells/uL (ref 850–3900)
MCH: 24.7 pg — ABNORMAL LOW (ref 27.0–33.0)
MCHC: 33.7 g/dL (ref 32.0–36.0)
MCV: 73.5 fL — AB (ref 80.0–100.0)
MONO ABS: 266 {cells}/uL (ref 200–950)
MPV: 10.8 fL (ref 7.5–12.5)
Monocytes Relative: 7 %
NEUTROS ABS: 1368 {cells}/uL — AB (ref 1500–7800)
Neutrophils Relative %: 36 %
Platelets: 183 10*3/uL (ref 140–400)
RBC: 5.7 MIL/uL (ref 4.20–5.80)
RDW: 14.5 % (ref 11.0–15.0)
WBC: 3.8 10*3/uL (ref 3.8–10.8)

## 2016-09-19 MED ORDER — DOCUSATE SODIUM 100 MG PO CAPS: 100.0000 mg | ORAL_CAPSULE | Freq: Two times a day (BID) | ORAL | 0 refills | Status: DC

## 2016-09-19 NOTE — Progress Notes (Signed)
Name: Walter Hebert   MRN: [id number]    DOB: 22-Jan-1951   Date:09/19/2016       Progress Note  Subjective  Chief Complaint  Chief Complaint  Patient presents with  . Constipation   This patient is usually followed by Dr. Rutherford Nail, new to be Constipation  This is a new problem. The current episode started in the past 7 days. Stool frequency: In the last week, he has had 5 BMs, but each BM is small in size as compared to his usual. He does not exercise regularly. There has been adequate water intake. Pertinent negatives include no abdominal pain, anorexia, back pain, fever, flatus, hemorrhoids, rectal pain or vomiting. He has tried nothing for the symptoms. There is no history of abdominal surgery, inflammatory bowel disease or neurologic disease.     Past Medical History:  Diagnosis Date  . Elevated PSA   . Enlarged prostate   . 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 Overweight   . Synovial cyst   . Tuberculosis    NONACTIVE    Past Surgical History:  Procedure Laterality Date  . COLONOSCOPY N/A 05/18/2015   Procedure: COLONOSCOPY;  Surgeon: Lucilla Lame, MD;  Location: [facility] ENDOSCOPY;  Service: Endoscopy;  Laterality: N/A;    Family History  Problem Relation Age of Onset  . Hyperlipidemia    . Prostate cancer Brother     DECEASED  . Kidney disease Neg Hx     Social History   Social History  . Marital status: Married    Spouse name: N/A  . Number of children: N/A  . Years of education: N/A   Occupational History  . Not on file.   Social History Main Topics  . Smoking status: Former Research scientist (life sciences)  . Smokeless tobacco: Not on file     Comment: was a light smoker quit 26 years ago  . Alcohol use No     Comment: quit 26 years ago  . Drug use: No  . Sexual activity: Not on file   Other Topics Concern  . Not on file   Social History Narrative  . No narrative on file     Current Outpatient Prescriptions:  .   Ascorbic Acid (VITAMIN C PO), Take by mouth., Disp: , Rfl:  .  Multiple Vitamin (MULTIVITAMIN) capsule, Take 1 capsule by mouth daily. Reported on 12/24/2015, Disp: , Rfl:  .  VITAMIN E PO, Take by mouth., Disp: , Rfl:  .  Biotin 1 MG CAPS, Take by mouth. Reported on 04/20/2016, Disp: , Rfl:  .  finasteride (PROSCAR) 5 MG tablet, Take 1 tablet (5 mg total) by mouth daily. (Patient not taking: Reported on 09/19/2016), Disp: 90 tablet, Rfl: 3 .  omeprazole (PRILOSEC) 40 MG capsule, Take by mouth. Reported on 04/20/2016, Disp: , Rfl:  .  tamsulosin (FLOMAX) 0.4 MG CAPS capsule, Take 1 capsule (0.4 mg total) by mouth daily. (Patient not taking: Reported on 09/19/2016), Disp: 90 capsule, Rfl: 3 .  triamcinolone cream (KENALOG) 0.1 %, Apply topically. Reported on 04/20/2016, Disp: , Rfl:   Allergies  Allergen Reactions  . Chloroquine Itching  . Hydroxychloroquine Sulfate Hives     Review of Systems  Constitutional: Negative for fever.  Gastrointestinal: Positive for constipation. Negative for abdominal pain, anorexia, flatus, hemorrhoids, rectal pain and vomiting.  Musculoskeletal: Negative for back pain.    Objective  Vitals:   09/19/16 1451  BP: 140/78  Pulse:  64  Temp: 97.9 F (36.6 C)  SpO2: 98%  Weight: 209 lb 1.6 oz (94.8 kg)    Physical Exam  Constitutional: He is oriented to person, place, and time and well-developed, well-nourished, and in no distress.  HENT:  Head: Normocephalic and atraumatic.  Cardiovascular: Normal rate, regular rhythm, S1 normal, S2 normal and normal heart sounds.   No murmur heard. Pulmonary/Chest: Effort normal and breath sounds normal. He has no wheezes. He has no rhonchi.  Abdominal: Soft. Bowel sounds are normal. There is no tenderness.  Genitourinary: Rectum normal. Rectal exam shows no tenderness and guaiac negative stool. Prostate is enlarged.  Neurological: He is alert and oriented to person, place, and time.  Psychiatric: Mood, memory, affect and  judgment normal.  Nursing note and vitals reviewed.    Assessment & Plan  1. 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 Hemoccult negative - docusate sodium (COLACE) 100 MG capsule; Take 1 capsule (100 mg total) by mouth 2 (two) times daily.  Dispense: 60 capsule; Refill: 0 - CBC with Differential - COMPLETE METABOLIC PANEL WITH GFR - POC Hemoccult Bld/Stl (1-Cd Office Dx)  2. Need for influenza vaccination  - Flu Vaccine QUAD 36+ mos PF IM (Fluarix & Fluzone Quad PF)   Kala Gassmann [facility] 09/19/2016 3:18 PM

## 2016-09-20 LAB — COMPLETE METABOLIC PANEL WITH GFR
ALT: 23 U/L (ref 9–46)
AST: 26 U/L (ref 10–35)
Albumin: 4 g/dL (ref 3.6–5.1)
Alkaline Phosphatase: 67 U/L (ref 40–115)
BILIRUBIN TOTAL: 0.4 mg/dL (ref 0.2–1.2)
BUN: 14 mg/dL (ref 7–25)
CO2: 28 mmol/L (ref 20–31)
CREATININE: 1.26 mg/dL — AB (ref 0.70–1.25)
Calcium: 9.5 mg/dL (ref 8.6–10.3)
Chloride: 107 mmol/L (ref 98–110)
GFR, EST AFRICAN AMERICAN: 70 mL/min (ref >=60)
GFR, Est Non African American: 60 mL/min (ref >=60)
GLUCOSE: 78 mg/dL (ref 65–99)
Potassium: 4.1 mmol/L (ref 3.5–5.3)
Sodium: 142 mmol/L (ref 135–146)
TOTAL PROTEIN: 6.9 g/dL (ref 6.1–8.1)

## 2016-09-20 LAB — POC HEMOCCULT BLD/STL (OFFICE/1-CARD/DIAGNOSTIC): FECAL OCCULT BLD: NEGATIVE

## 2016-10-20 ENCOUNTER — Other Ambulatory Visit: Payer: BLUE CROSS/BLUE SHIELD

## 2016-10-20 DIAGNOSIS — R972 Elevated prostate specific antigen [PSA]: Secondary | ICD-10-CM

## 2016-10-21 LAB — PSA: Prostate Specific Ag, Serum: 4.2 ng/mL — ABNORMAL HIGH (ref 0.0–4.0)

## 2016-10-23 ENCOUNTER — Encounter: Payer: Self-pay | Admitting: Urology

## 2016-10-23 ENCOUNTER — Ambulatory Visit (INDEPENDENT_AMBULATORY_CARE_PROVIDER_SITE_OTHER): Payer: BLUE CROSS/BLUE SHIELD | Admitting: Urology

## 2016-10-23 VITALS — BP 146/74 | HR 52 | Ht 70.0 in | Wt 202.8 lb

## 2016-10-23 DIAGNOSIS — Z87898 Personal history of other specified conditions: Secondary | ICD-10-CM | POA: Diagnosis not present

## 2016-10-23 DIAGNOSIS — N138 Other obstructive and reflux uropathy: Secondary | ICD-10-CM

## 2016-10-23 DIAGNOSIS — N4231 Prostatic intraepithelial neoplasia: Secondary | ICD-10-CM

## 2016-10-23 DIAGNOSIS — Z227 Latent tuberculosis: Secondary | ICD-10-CM | POA: Insufficient documentation

## 2016-10-23 DIAGNOSIS — N401 Enlarged prostate with lower urinary tract symptoms: Secondary | ICD-10-CM | POA: Diagnosis not present

## 2016-10-23 DIAGNOSIS — B001 Herpesviral vesicular dermatitis: Secondary | ICD-10-CM | POA: Insufficient documentation

## 2016-10-23 DIAGNOSIS — Z8613 Personal history of malaria: Secondary | ICD-10-CM | POA: Insufficient documentation

## 2016-10-23 NOTE — Progress Notes (Signed)
9:11 AM   Walter Hebert 08/22/1951 [id number]  Referring provider: Ashok Norris, MD [address] [place], [place] [address]  Chief Complaint  Patient presents with  . Benign Prostatic Hypertrophy    HPI: Patient is a 65 year old [place] male  who presents today for a six month follow up for history of elevated PSA, HGPIN and BPH with LUTS.          Elevated PSA [name] was found to be 8.3 on 12/25/2013.  His most current PSA is 4.2 on 10/20/2016.  His brother was diagnosed with fatal prostate cancer.    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 His current PSA is 4.2.  He is being monitored with biannual exams and PSA's.  BPH WITH LUTS His IPSS score today is 4, which is mild lower urinary tract symptomatology. He is pleased with his quality life due to his urinary symptoms.   His previous IPSS score was 3/1.  His major complaints today are frequency and nocturia x 1.   He has had these symptoms for one year.  He is no longer taking tamsulosin and finasteride.   He denies any dysuria, hematuria or suprapubic pain.   He also denies any recent fevers, chills, nausea or vomiting.  He has a family history of PCa, with his brother with fatal prostate cancer.         IPSS    Row Name 10/23/16 0800         International Prostate Symptom Score   How often have you had the sensation of not emptying your bladder? Not at All     How often have you had to urinate less than every two hours? Less than half the time     How often have you found you stopped and started again several times when you urinated? Not at All     How often have you found it difficult to postpone urination? Less than 1 in 5 times     How often have you had a weak urinary stream? Not at All     How often have you had to strain to start urination? Not at All     How many times did you  typically get up at night to urinate? 1 Time     Total IPSS Score 4       Quality of Life due to urinary symptoms   If you were to spend the rest of your life with your urinary condition just the way it is now how would you feel about that? Pleased        Score:  1-7 Mild 8-19 Moderate 20-35 Severe    PMH: Past Medical History:  Diagnosis Date  . Elevated PSA   . Enlarged prostate   . 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 Overweight   . Synovial cyst   . Tuberculosis    NONACTIVE    Surgical History: Past Surgical History:  Procedure Laterality Date  . COLONOSCOPY N/A 05/18/2015   Procedure: COLONOSCOPY;  Surgeon: Lucilla Lame, MD;  Location: [facility];  Service: Endoscopy;  Laterality: N/A;    Home Medications:    Medication List       Accurate as of 10/23/16  9:11 AM. Always use your  most recent med list.          Biotin 1 MG Caps Take by mouth. Reported on 04/20/2016   docusate sodium 100 MG capsule Commonly known as:  COLACE Take 1 capsule (100 mg total) by mouth 2 (two) times daily.   finasteride 5 MG tablet Commonly known as:  PROSCAR Take 1 tablet (5 mg total) by mouth daily.   multivitamin capsule Take 1 capsule by mouth daily. Reported on 12/24/2015   omeprazole 40 MG capsule Commonly known as:  PRILOSEC Take by mouth. Reported on 04/20/2016   tamsulosin 0.4 MG Caps capsule Commonly known as:  FLOMAX Take 1 capsule (0.4 mg total) by mouth daily.   triamcinolone cream 0.1 % Commonly known as:  KENALOG Apply topically. Reported on 04/20/2016   VITAMIN C PO Take by mouth.   VITAMIN E PO Take by mouth.       Allergies:  Allergies  Allergen Reactions  . Chloroquine Itching  . Hydroxychloroquine Sulfate Hives    Family History: Family History  Problem Relation Age of Onset  . Hyperlipidemia    . Prostate cancer Brother     DECEASED  . Kidney disease Neg Hx     Social  History:  reports that he has quit smoking. He has never used smokeless tobacco. He reports that he does not drink alcohol or use drugs.  ROS: UROLOGY Frequent Urination?: Yes Hard to postpone urination?: No Burning/pain with urination?: No Get up at night to urinate?: Yes Leakage of urine?: No Urine stream starts and stops?: No Trouble starting stream?: No Do you have to strain to urinate?: No Blood in urine?: No Urinary tract infection?: No Sexually transmitted disease?: No Injury to kidneys or bladder?: No Painful intercourse?: No Weak stream?: No Erection problems?: No Penile pain?: No Gastrointestinal Nausea?: No Vomiting?: No Indigestion/heartburn?: No Diarrhea?: No Constipation?: No Constitutional Fever: No Night sweats?: No Weight loss?: No Fatigue?: No Skin Skin rash/lesions?: No Itching?: No Eyes Blurred vision?: No Double vision?: No Ears/Nose/Throat Sore throat?: No Sinus problems?: No Hematologic/Lymphatic Swollen glands?: No Easy bruising?: No Cardiovascular Leg swelling?: No Chest pain?: No Respiratory Cough?: No Shortness of breath?: No Endocrine Excessive thirst?: No Musculoskeletal Back pain?: No Joint pain?: No Neurological Headaches?: No Dizziness?: No Psychologic Depression?: No Anxiety?: No  Physical Exam: BP (!) 146/74 (BP Location: Left Arm, Patient Position: Sitting, Cuff Size: Large)   Pulse (!) 52   Ht 5\' 10"  (1.778 m)   Wt 202 lb 12.8 oz (92 kg)   BMI 29.10 kg/m   Constitutional: Well nourished. Alert and oriented, No acute distress. HEENT: [place] AT, moist mucus membranes. Trachea midline, no masses. Cardiovascular: No clubbing, cyanosis, or edema. Respiratory: Normal respiratory effort, no increased work of breathing. GI: Abdomen is soft, non tender, non distended, no abdominal masses. Liver and spleen not palpable.  No hernias appreciated.  Stool sample for occult testing is not indicated.   GU: No CVA tenderness.   No bladder fullness or masses.  Patient with circumcised phallus.  Urethral meatus is patent.  No penile discharge. No penile lesions or rashes. Scrotum without lesions, cysts, rashes and/or edema.  Testicles are located scrotally bilaterally. No masses are appreciated in the testicles. Left and right epididymis are normal. Rectal: Patient with  normal sphincter tone. Anus and perineum without scarring or rashes. No rectal masses are appreciated. Prostate is approximately 60 grams, no nodules are appreciated. Seminal vesicles are normal. Skin: No rashes, bruises or suspicious lesions. Lymph: No  cervical or inguinal adenopathy. Neurologic: Grossly intact, no focal deficits, moving all 4 extremities. Psychiatric: Normal mood and affect.  Laboratory Data: PSA History:    8.3 ng/mL on 12/25/2013    5.2 ng/mL on 11/02/2014    6.3 ng/mL on 06/23/2015    4.5 ng/mL on 09/22/2015    4.5 ng/mL on 12/17/2015    4.2 ng/mL on 10/20/2016   Assessment & Plan:    1. History of elevated PSA  - PSA is stable  - Patient no longer taking the finasteride  - RTC in 6 months for PSA and exam  2. HGPIN  - Found in two cores on the May 2015 biopsy.  PSA at time of biopsy was 8.3 ng/mL  - We will continue to monitor closely, RTC in 6 months for PSA and exam  3. BPH with LUTS  - IPSS score is 4/1, it is worsening  - Continue conservative management, avoiding bladder irritants and timed voiding's  - RTC in 6 months for IPSS, PSA and exam    Return in about 6 months (around 04/22/2017) for IPSS, PSA and exam.  Zara Council, [facility]  [place] [address] [place], [place] [address] [phone]

## 2016-11-28 ENCOUNTER — Telehealth: Payer: Self-pay

## 2016-11-28 NOTE — Telephone Encounter (Signed)
Pt returned your call.  Please call him at your earliest convenience.

## 2016-11-28 NOTE — Telephone Encounter (Signed)
Pt called requesting a phone call from [facility]. Pt absolutely refused to tell nursing staff any information.

## 2016-11-28 NOTE — Telephone Encounter (Signed)
I have left a message to return my phone call on his voice mail.

## 2016-11-29 ENCOUNTER — Other Ambulatory Visit: Payer: Self-pay | Admitting: Urology

## 2016-11-29 MED ORDER — AVANAFIL 200 MG PO TABS: 1.0000 | ORAL_TABLET | ORAL | 12 refills | Status: DC | PRN

## 2016-11-29 NOTE — Telephone Encounter (Signed)
rx sent to [facility].

## 2016-11-29 NOTE — Telephone Encounter (Signed)
Patient's wife has finally received her [place] and [place] in [place].  He would like a script for [employer].  I will have a prescription sent to the specialty pharmacy for him.

## 2017-04-18 ENCOUNTER — Other Ambulatory Visit: Payer: Self-pay

## 2017-04-18 DIAGNOSIS — Z87898 Personal history of other specified conditions: Secondary | ICD-10-CM

## 2017-04-20 ENCOUNTER — Other Ambulatory Visit: Payer: BLUE CROSS/BLUE SHIELD

## 2017-04-20 ENCOUNTER — Ambulatory Visit: Payer: BLUE CROSS/BLUE SHIELD | Admitting: Family Medicine

## 2017-04-20 NOTE — Progress Notes (Signed)
8:48 AM   Walter Hebert 1951-05-28 [id number]  Referring provider: Ashok Norris, MD No address on file  Chief Complaint  Patient presents with  . Elevated PSA    66month follow up    HPI: Patient is a 66 year old [place] male  who presents today for a six month follow up for history of elevated PSA, HGPIN and BPH with LUTS.          Elevated PSA [name] was found to be 8.3 on 12/25/2013.  His most current PSA is 4.2 on 10/20/2016.  His brother was diagnosed with fatal prostate cancer.    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 His current PSA is 4.2.  He is being monitored with biannual exams and PSA's.  BPH WITH LUTS His IPSS score today is 3, which is mild lower urinary tract symptomatology.  He is pleased with his quality life due to his urinary symptoms.   His previous IPSS score was 4/1.  His major complaints today are nocturia.  He has had these symptoms for two years.  He is no longer taking tamsulosin and finasteride.   He denies any dysuria, hematuria or suprapubic pain.   He also denies any recent fevers, chills, nausea or vomiting.  He has a family history of PCa, with his brother with fatal prostate cancer.         IPSS    Row Name 04/23/17 0800         International Prostate Symptom Score   How often have you had the sensation of not emptying your bladder? Not at All     How often have you had to urinate less than every two hours? Less than 1 in 5 times     How often have you found you stopped and started again several times when you urinated? Not at All     How often have you found it difficult to postpone urination? Less than 1 in 5 times     How often have you had a weak urinary stream? Not at All     How often have you had to strain to start urination? Not at All     How many times did you typically get up at night to urinate? 1  Time     Total IPSS Score 3       Quality of Life due to urinary symptoms   If you were to spend the rest of your life with your urinary condition just the way it is now how would you feel about that? Pleased        Score:  1-7 Mild 8-19 Moderate 20-35 Severe  Erectile dysfunction His SHIM score is 18, which is mild ED.   He has been having difficulty with erections for several years.   His major complaint is maintaining.  His libido is preserved.   His risk factors for ED are age and BPH.   He denies any painful erections or curvatures with his erections.   He is still having spontaneous erections.  He has tried PDE5-inhibitors in the past with success.       SHIM    Row Name 04/23/17 [phone]         SHIM: Over the last 6 months:   How do you rate your confidence that you could get and  keep an erection? Moderate     When you had erections with sexual stimulation, how often were your erections hard enough for penetration (entering your partner)? A Few Times (much less than half the time)     During sexual intercourse, how often were you able to maintain your erection after you had penetrated (entered) your partner? Sometimes (about half the time)     During sexual intercourse, how difficult was it to maintain your erection to completion of intercourse? Not Difficult     When you attempted sexual intercourse, how often was it satisfactory for you? Almost Always or Always       SHIM Total Score   SHIM 18        Score: 1-7 Severe ED 8-11 Moderate ED 12-16 Mild-Moderate ED 17-21 Mild ED 22-25 No ED      PMH: Past Medical History:  Diagnosis Date  . Elevated PSA   . Enlarged prostate   . 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 Overweight   . Synovial cyst   . Tuberculosis    NONACTIVE    Surgical History: Past Surgical History:  Procedure Laterality Date  . COLONOSCOPY N/A 05/18/2015   Procedure: COLONOSCOPY;   Surgeon: Lucilla Lame, MD;  Location: [facility] END[facility]COPY;  Service: Endoscopy;  Laterality: N/A;    Home Medications:  Allergies as of 04/23/2017      Reactions   Chloroquine Itching   Hydroxychloroquine Sulfate Hives      Medication List       Accurate as of 04/23/17  8:48 AM. Always use your most recent med list.          multivitamin capsule Take 1 capsule by mouth daily. Reported on 12/24/2015   VITAMIN C PO Take by mouth.   VITAMIN E PO Take by mouth.       Allergies:  Allergies  Allergen Reactions  . Chloroquine Itching  . Hydroxychloroquine Sulfate Hives    Family History: Family History  Problem Relation Age of Onset  . Hyperlipidemia    . Prostate cancer Brother     DECEASED  . Kidney disease Neg Hx     Social History:  reports that he has quit smoking. He has never used smokeless tobacco. He reports that he does not drink alcohol or use drugs.  ROS: UROLOGY Frequent Urination?: No Hard to postpone urination?: No Burning/pain with urination?: No Get up at night to urinate?: Yes Leakage of urine?: No Urine stream starts and stops?: No Trouble starting stream?: No Do you have to strain to urinate?: No Blood in urine?: No Urinary tract infection?: No Sexually transmitted disease?: No Injury to kidneys or bladder?: No Painful intercourse?: No Weak stream?: No Erection problems?: No Penile pain?: No Gastrointestinal Nausea?: No Vomiting?: No Indigestion/heartburn?: No Diarrhea?: No Constipation?: No Constitutional Fever: No Night sweats?: Yes Weight loss?: No Fatigue?: No Skin Skin rash/lesions?: No Itching?: No Eyes Blurred vision?: No Double vision?: No Ears/Nose/Throat Sore throat?: No Sinus problems?: No Hematologic/Lymphatic Swollen glands?: No Easy bruising?: No Cardiovascular Leg swelling?: No Chest pain?: No Respiratory Cough?: No Shortness of breath?: No Endocrine Excessive thirst?: No Musculoskeletal Back pain?:  No Joint pain?: No Neurological Headaches?: No Dizziness?: No Psychologic Depression?: No Anxiety?: No  Physical Exam: BP (!) 152/76   Pulse 63   Ht 5\' 10"  (1.778 m)   Wt 200 lb (90.7 kg)   BMI 28.70 kg/m   Constitutional: Well nourished.  Alert and oriented, No acute distress. HEENT: [place] AT, moist mucus membranes. Trachea midline, no masses. Cardiovascular: No clubbing, cyanosis, or edema. Respiratory: Normal respiratory effort, no increased work of breathing. GI: Abdomen is soft, non tender, non distended, no abdominal masses. Liver and spleen not palpable.  No hernias appreciated.  Stool sample for occult testing is not indicated.   GU: No CVA tenderness.  No bladder fullness or masses.  Patient with circumcised phallus.  Urethral meatus is patent.  No penile discharge. No penile lesions or rashes. Scrotum without lesions, cysts, rashes and/or edema.  Testicles are located scrotally bilaterally. No masses are appreciated in the testicles. Left and right epididymis are normal. Rectal: Patient with  normal sphincter tone. Anus and perineum without scarring or rashes. No rectal masses are appreciated. Prostate is approximately 60 grams, no nodules are appreciated. Seminal vesicles are normal. Skin: No rashes, bruises or suspicious lesions. Lymph: No cervical or inguinal adenopathy. Neurologic: Grossly intact, no focal deficits, moving all 4 extremities. Psychiatric: Normal mood and affect.  Laboratory Data: PSA History:    8.3 ng/mL on 12/25/2013    5.2 ng/mL on 11/02/2014    6.3 ng/mL on 06/23/2015    4.5 ng/mL on 09/22/2015    4.5 ng/mL on 12/17/2015    4.2 ng/mL on 10/20/2016   Assessment & Plan:    1. History of elevated PSA  - PSA drawn today  - Patient no longer taking the finasteride  - RTC in 6 months for PSA and exam  2. HGPIN  - Found in two cores on the May 2015 biopsy.  PSA at time of biopsy was 8.3 ng/mL  - PSA drawn today - if stable RTC 6 months  - We  will continue to monitor closely, RTC in 6 months for PSA and exam  3. BPH with LUTS  - IPSS score is 3/1, it is improving  - Continue conservative management, avoiding bladder irritants and timed voiding's  - RTC in 6 months for IPSS, PSA and exam   4. Erectile dysfunction  - SHIM score is 18  - I explained to the patient that in order to achieve an erection it takes good functioning of the nervous system (parasympathetic, sympathetic, sensory and motor), good blood flow into the erectile tissue of the penis and a desire to have sex  - I explained that conditions like diabetes, hypertension, coronary artery disease, peripheral vascular disease, smoking, alcohol consumption, age, sleep apnea and BPH can diminish the ability to have an erection  - A recent study published in Sex Med 2018 Apr 13 revealed moderate to vigorous aerobic exercise for 40 minutes 4 times per week can decrease erectile problems caused by physical inactivity, obesity, hypertension, metabolic syndrome and/or cardiovascular diseases  - Continue Stendra  - RTC in 6 months for repeat SHIM score and exam   Return in about 6 months (around 10/24/2017) for IPSS, SHIM, PSA and exam.  Zara Council, [facility]  [facility] [address], [place] [address] [place], [place] [address] [phone]

## 2017-04-23 ENCOUNTER — Ambulatory Visit (INDEPENDENT_AMBULATORY_CARE_PROVIDER_SITE_OTHER): Payer: BLUE CROSS/BLUE SHIELD | Admitting: Urology

## 2017-04-23 ENCOUNTER — Encounter: Payer: Self-pay | Admitting: Urology

## 2017-04-23 VITALS — BP 152/76 | HR 63 | Ht 70.0 in | Wt 200.0 lb

## 2017-04-23 DIAGNOSIS — Z87898 Personal history of other specified conditions: Secondary | ICD-10-CM | POA: Diagnosis not present

## 2017-04-23 DIAGNOSIS — N138 Other obstructive and reflux uropathy: Secondary | ICD-10-CM

## 2017-04-23 DIAGNOSIS — N401 Enlarged prostate with lower urinary tract symptoms: Secondary | ICD-10-CM | POA: Diagnosis not present

## 2017-04-23 DIAGNOSIS — N529 Male erectile dysfunction, unspecified: Secondary | ICD-10-CM | POA: Diagnosis not present

## 2017-04-23 DIAGNOSIS — N4231 Prostatic intraepithelial neoplasia: Secondary | ICD-10-CM | POA: Diagnosis not present

## 2017-04-24 ENCOUNTER — Ambulatory Visit (INDEPENDENT_AMBULATORY_CARE_PROVIDER_SITE_OTHER): Payer: BLUE CROSS/BLUE SHIELD | Admitting: Family Medicine

## 2017-04-24 ENCOUNTER — Telehealth: Payer: Self-pay

## 2017-04-24 ENCOUNTER — Encounter: Payer: Self-pay | Admitting: Family Medicine

## 2017-04-24 VITALS — BP 129/68 | HR 64 | Temp 98.0°F | Resp 16 | Ht 70.0 in | Wt 211.4 lb

## 2017-04-24 DIAGNOSIS — R030 Elevated blood-pressure reading, without diagnosis of hypertension: Secondary | ICD-10-CM

## 2017-04-24 DIAGNOSIS — Z7689 Persons encountering health services in other specified circumstances: Secondary | ICD-10-CM | POA: Diagnosis not present

## 2017-04-24 DIAGNOSIS — R7611 Nonspecific reaction to tuberculin skin test without active tuberculosis: Secondary | ICD-10-CM

## 2017-04-24 DIAGNOSIS — Z227 Latent tuberculosis: Secondary | ICD-10-CM

## 2017-04-24 DIAGNOSIS — M62838 Other muscle spasm: Secondary | ICD-10-CM | POA: Diagnosis not present

## 2017-04-24 DIAGNOSIS — R61 Generalized hyperhidrosis: Secondary | ICD-10-CM | POA: Diagnosis not present

## 2017-04-24 DIAGNOSIS — Z87898 Personal history of other specified conditions: Secondary | ICD-10-CM

## 2017-04-24 LAB — PSA: Prostate Specific Ag, Serum: 4.1 ng/mL — ABNORMAL HIGH (ref 0.0–4.0)

## 2017-04-24 MED ORDER — BACLOFEN 10 MG PO TABS: 5.0000 mg | ORAL_TABLET | Freq: Three times a day (TID) | ORAL | 1 refills | Status: DC | PRN

## 2017-04-24 NOTE — Patient Instructions (Signed)
Thank you for coming to the clinic today.  1.  Neck pain is likely from muscle spasm, probably mild injury, could have some arthritis, worse with leaning forward while working - Try to take some breaks 30 min to 1 hour to do neck range of motion exercises to avoid stiffness and worsening - This may take several weeks to improve  Recommend to start taking Tylenol Extra Strength 500mg  tabs - take 1 to 2 tabs per dose (max 1000mg ) every 6-8 hours for pain, max 24 hour daily dose is 6 tablets or 3000mg . In the future you can repeat the same everyday Tylenol course for 1-2 weeks at a time.   Do not take Advil, Ibuprofen, Aleve  Start taking Baclofen (Lioresal) 10mg  (muscle relaxant) - start with half (cut) to one whole pill at night as needed for next 1-3 nights (may make you drowsy, caution with driving) see how it affects you, then if tolerated increase to one pill 2 to 3 times a day or (every 8 hours as needed)  Try heating pad or muscle rub.   2. For the Night sweats and fever - I do not know exact cause, but I do want to see your Health Department records from few years ago, we may need to do other testing blood test or x-ray, OR send you back to Health Department for testing and evaluation - Also we may refer you to Infectious disease doctor with your history of malaria  Please schedule a follow-up appointment with Dr. Parks Ranger in 4-6 weeks for follow-up Fever/Night Sweats, Neck Pain  If you have any other questions or concerns, please feel free to call the clinic or send a message through [facility]. You may also schedule an earlier appointment if necessary.  Walter Putnam, DO [place]

## 2017-04-24 NOTE — Progress Notes (Signed)
Subjective:    Patient ID: Walter Hebert, male    DOB: May 31, 1951, 66 y.o.   MRN: [id number]  Walter Hebert is a 66 y.o. male presenting on 04/24/2017 for Establish Care (pt is concerned about neck pain with ROM turning Left and Right effects on work)  Previous PCP Dr Ashok Norris through [facility], but has not been in several years and they were retiring. Now here to establish as new patient, he lives locally in [place].  HPI   History of Latent TB / Recent Fever / Night Sweats - few weeks only, not every night - History of Latent TB, he came to [place] >19 years ago from [place], he does return for visits occasionally, last in 2016. He states he had been to [place] about 4 years ago for possible TB symptoms, concern about possible exposure in public place in [place] but no known distinct TB exposure or contact. He explains had extensive testing and management, was told did not have TB. He has diagnosis of Latent TB, unable to clearly explain this, he does not have records available from HD but will request them. - Now new complaint with past few weeks only, not every night. Having some mild fevers and night sweats only at night, not during day. Associated with some nausea without vomiting and loss of appetite upset stomach. - Does admit to history of malaria in past >20 years ago, treated, did have allergy to medications - No unintentional wt loss, has had wt gain in 6 months, +10 lbs - Admits occasional non productive cough - Denies any hemoptysis, dyspnea, chest pain, productive cough, myalgias  Neck Pain / Muscle Spasm: - Reports recent problem about 2 weeks ago woke up with some neck pain and stiffness then it has gradually improved over past few weeks, now seems better has no pain with normal activity or not turning head, but still has residual R sided neck pain and tightness in neck muscles with rotation to Left, more mild sharp pains - Has not taken any OTC meds NSAIDs or Tylenol,  no topical muscle rub  - flex/ext normal without pain and normal range of motion - No known injury or change - He works in Careers adviser, Garment/textile technologist, vinyl, hardwood, 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 He has had some elevated readings occasionally at doctors office, but usually improves on re-check. Does not check BP regularly but has done before. 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 He was on meds for BPH, but then about 1 year ago stopped Finasteride and Flomax and doing well   Past Medical History:  Diagnosis Date  . Elevated PSA   . Enlarged prostate   . 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 Night sweats   . Overweight   . Synovial cyst   . Tuberculosis  NONACTIVE   Past Surgical History:  Procedure Laterality Date  . COLONOSCOPY N/A 05/18/2015   Procedure: COLONOSCOPY;  Surgeon: Lucilla Lame, MD;  Location: [facility] ENDOSCOPY;  Service: Endoscopy;  Laterality: N/A;   Social History   Social History  . Marital status: Married    Spouse name: N/A  . Number of children: N/A  . Years of education: N/A   Occupational History  . Not on file.   Social History Main Topics  . Smoking status: Former Research scientist (life sciences)  . Smokeless tobacco: Never Used     Comment: was a light smoker quit 26 years ago  . Alcohol use No     Comment:  quit 26 years ago  . Drug use: No  . Sexual activity: Not on file   Other Topics Concern  . Not on file   Social History Narrative  . No narrative on file   Family History  Problem Relation Age of Onset  . Hyperlipidemia    . Prostate cancer Brother     DECEASED  . Kidney disease Neg Hx    Current Outpatient Prescriptions on File Prior to Visit  Medication Sig  . Ascorbic Acid (VITAMIN C PO) Take by mouth.  . Multiple Vitamin (MULTIVITAMIN) capsule Take 1 capsule by mouth daily. Reported on 12/24/2015  . VITAMIN E PO Take by mouth.   No current facility-administered medications on file prior to visit.     Review of Systems  Constitutional: Positive for fever. Negative for activity change, appetite change, chills, diaphoresis, fatigue and unexpected weight change.       Night sweats, intermittent  HENT: Negative for congestion, hearing loss and sinus pressure.   Eyes: Negative for visual disturbance.  Respiratory: Negative for apnea, cough, choking, chest tightness, shortness of breath and wheezing.        No hemoptysis  Cardiovascular: Negative for chest pain, palpitations and leg swelling.  Gastrointestinal: Negative for abdominal pain, anal bleeding, blood in stool, constipation, diarrhea, nausea and vomiting.  Endocrine: Negative for cold intolerance and polyuria.  Genitourinary: Negative for difficulty urinating, dysuria, frequency, hematuria and urgency.  Musculoskeletal: Positive for arthralgias (neck pain, improving). Negative for back pain and neck pain.  Skin: Negative for rash.  Allergic/Immunologic: Negative for environmental allergies.  Neurological: Negative for dizziness, weakness, light-headedness, numbness and headaches.  Hematological: Negative for adenopathy.  Psychiatric/Behavioral: Negative for behavioral problems, dysphoric mood and sleep disturbance. The patient is not nervous/anxious.    Per HPI unless specifically indicated above     Objective:      BP 129/68   Pulse 64   Temp 98 F (36.7 C) (Oral)   Resp 16   Ht 5\' 10"  (1.778 m)   Wt 211 lb 6.4 oz (95.9 kg)   BMI 30.33 kg/m   Wt Readings from Last 3 Encounters:  04/24/17 211 lb 6.4 oz (95.9 kg)  04/23/17 200 lb (90.7 kg)  10/23/16 202 lb 12.8 oz (92 kg)    Physical Exam  Constitutional: He is oriented to person, place, and time. He appears well-developed and well-nourished. No distress.  Well-appearing, comfortable, cooperative  HENT:  Head: Normocephalic and atraumatic.  Mouth/Throat: Oropharynx is clear and moist.  Oropharynx clear without erythema, exudates, edema or asymmetry.  Eyes: Conjunctivae and EOM are normal. Pupils are equal, round, and reactive to light.  Neck: No thyromegaly present.  Neck Inspection /  Palpation: no obvious deformity, symmetrical appearance, mild upper paraspinal c-spine with hypertonicity and spasm, mild tender to palpation near capitus  muscles not lower neck and trapezius. Non focal, no bony tenderness over spinous process ROM: Mild reduced Left rotation, mostly preserved R rotation. Flex/Ext neck FROM Special Testing: Spurling's maneuver negative for radiculopathy upper ext Strength: 5/5 grip bilateral upper extremities Neurovascular: distally intact  Cardiovascular: Normal rate, regular rhythm, normal heart sounds and intact distal pulses.   No murmur heard. Pulmonary/Chest: Effort normal and breath sounds normal. No respiratory distress. He has no wheezes. He has no rales.  Good air movement. Speaks full sentences  Abdominal: Soft. Bowel sounds are normal. He exhibits no distension and no mass. There is no tenderness.  Musculoskeletal: He exhibits no edema.  Lymphadenopathy:    He has no cervical adenopathy.  Neurological: He is alert and oriented to person, place, and time.  Skin: Skin is warm and dry. No rash noted. He is not diaphoretic. No erythema.  Psychiatric: He has a normal mood and affect. His behavior is normal.  Nursing  note and vitals reviewed.  Results for orders placed or performed in visit on 04/23/17  PSA  Result Value Ref Range   Prostate Specific Ag, Serum 4.1 (H) 0.0 - 4.0 ng/mL      Assessment & Plan:   Problem List Items Addressed This Visit    TB lung, latent    Concern for possible latent TB and now with some active symptoms of night sweats and fever, however clinically does not make sense based on timing of these symptoms, without dyspnea, cough, hemoptysis, weight loss. No focal findings on lung exam, he is well appearing overall. - Requested records ASAP from [facility], awaiting response  Plan: 1. Discussed that will need to review HD records first before proceeding with other testing, most likely best option will be for him to return to local HD for evaluation and diagnostics, likely need serum and CXR. 2. Pending records still, will contact [facility] this week [phone], in past have discussed other potential screening TB cases with RN 3. Advised prompt follow-up if any acute worsening      Night sweats - Primary    Unclear exact etiology, confusing with acute onset few weeks without known trigger, seems recent intermittent problem, very well could be hormonal, cannot rule out potential TB given history but clinically seems less likely, see A&P - Some low grade fevers by report - Similar to Malaria history but no recent exposures, last out of country in [place] 2016 - Follow-up closely after receive records from [facility], likely needs to return there to rule out TB first, then if persistent symptoms may need referral to ID given history of Malaria or for other evaluation, will notify Urology as well if TB ruled out, to consider prostatic etiology, despite still normal PSA       Elevated BP without diagnosis of hypertension    Moderately elevated SBP initially, improved on manual re-check No known prior dx HTN, some other elevations on chart review LIkely with  acute neck pain and discomfort now Monitor outside office more regularly Improve lifestyle Follow-up as needed       Other Visit Diagnoses    Encounter to establish care with new doctor       Muscle spasms of neck      Gradually improving bilateral upper cervical paraspinal vs capitus muscle spasm, likely secondary to 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 No evidence of radicular symptoms or neurological deficits or weakness. - No recent  imaging, likely underlying arthritis, has in hands - Inadequate conservative treatments at home  Plan: 1. May take Tylenol PRN 2. Start new rx Baclofen 5-10mg  TID PRN or just nightly for now 3. Use topical heat or muscle rub 4. Take breaks to avoid fixed neck position >67min to 1 hour  5. Follow-up if not improving - future consider PT referral    Relevant Medications   baclofen (LIORESAL) 10 MG tablet      Meds ordered this encounter  Medications  . baclofen (LIORESAL) 10 MG tablet    Sig: Take 0.5-1 tablets (5-10 mg total) by mouth 3 (three) times daily as needed for muscle spasms. Or only at bedtime if needed    Dispense:  30 each    Refill:  1    Follow up plan: Return in about 4 weeks (around 05/22/2017) for Fever/Night Sweats, Neck Pain.  Nobie Putnam, [facility] 04/25/2017, 1:13 AM

## 2017-04-24 NOTE — Telephone Encounter (Signed)
-----   Message from Nori Riis, PA-C sent at 04/24/2017  7:48 AM EDT ----- [name] is stable at 4.1.   We will see him in 6 months.  PSA to be drawn before his next appointment.

## 2017-04-24 NOTE — Telephone Encounter (Signed)
LMOM

## 2017-04-25 ENCOUNTER — Telehealth: Payer: Self-pay | Admitting: Family Medicine

## 2017-04-25 DIAGNOSIS — Z227 Latent tuberculosis: Secondary | ICD-10-CM

## 2017-04-25 DIAGNOSIS — R61 Generalized hyperhidrosis: Secondary | ICD-10-CM | POA: Insufficient documentation

## 2017-04-25 DIAGNOSIS — Z8613 Personal history of malaria: Secondary | ICD-10-CM

## 2017-04-25 NOTE — Telephone Encounter (Signed)
Spoke with pt in reference to PSA results and f/u appt. Pt voiced understanding.  

## 2017-04-25 NOTE — Assessment & Plan Note (Signed)
Concern for possible latent TB and now with some active symptoms of night sweats and fever, however clinically does not make sense based on timing of these symptoms, without dyspnea, cough, hemoptysis, weight loss. No focal findings on lung exam, he is well appearing overall. - Requested records ASAP from [facility], awaiting response  Plan: 1. Discussed that will need to review HD records first before proceeding with other testing, most likely best option will be for him to return to local HD for evaluation and diagnostics, likely need serum and CXR. 2. Pending records still, will contact [facility] this week [phone], in past have discussed other potential screening TB cases with RN 3. Advised prompt follow-up if any acute worsening

## 2017-04-25 NOTE — Telephone Encounter (Signed)
I saw this new patient yesterday 04/24/17, and there was a concern with history of Latent TB. We faxed a records request to [facility], but they cannot confirm if he was there patient years ago, we are awaiting a response on his records.  Could you call the [facility] Dept TB nurse Aileen Fass RN at [phone]? I would like to check if this patient was seen there and if they can review their prior evaluation and any other information they have on them, also I would like to speak with her to review this case to determine what to do next for this patient.  Nobie Putnam, [facility] 04/25/2017, 8:15 AM

## 2017-04-25 NOTE — Telephone Encounter (Signed)
Called health Dept either a nurse or physician will call us back.

## 2017-04-25 NOTE — Assessment & Plan Note (Signed)
Moderately elevated SBP initially, improved on manual re-check No known prior dx HTN, some other elevations on chart review LIkely with acute neck pain and discomfort now Monitor outside office more regularly Improve lifestyle Follow-up as needed

## 2017-04-25 NOTE — Assessment & Plan Note (Signed)
Unclear exact etiology, confusing with acute onset few weeks without known trigger, seems recent intermittent problem, very well could be hormonal, cannot rule out potential TB given history but clinically seems less likely, see A&P - Some low grade fevers by report - Similar to Malaria history but no recent exposures, last out of country in [place] 2016 - Follow-up closely after receive records from [facility], likely needs to return there to rule out TB first, then if persistent symptoms may need referral to ID given history of Malaria or for other evaluation, will notify Urology as well if TB ruled out, to consider prostatic etiology, despite still normal PSA

## 2017-04-26 NOTE — Telephone Encounter (Signed)
Today 04/26/17 I have spoken with several different parties regarding this patient.  1. Approx 0830 today spoke with Jacqlyn Larsen [facility]) discussed patient's prior records with [facility] Dept via phone, but do not have physical copies of record for review at this time. She reported that he completed Latent TB treatment with Rifampin in 2015 (August to December), and he was seen in consult with Dr Ola Spurr [facility] Infectious Disease). She recommend speaking with Aileen Fass regarding potential TB vs Latent TB case.  2. Approx 0900 today spoke with Aileen Fass (TB Nurse through [facility]), reviewed the patient's case, and his prior history, agree to proceed with urgent referral to Infectious Disease [facility], Dr Ola Spurr) for further evaluation, work-up and potential treatment.  In interval I have placed Urgent Referral to ID (Dr Ola Spurr) and faxed note to [phone].  3. Approx 1130am spoke with Theadora Rama (ID office, nurse, Dr Ola Spurr) - They agree with this referral and plan. They will review case and schedule patient as soon as needed. If any significant concerns they will notify Health Department for further management.  Approx 1215pm, called patient to review this current course and plan, he agrees and will stay tuned for phone call from ID to schedule apt. No further concerns and no new symptoms.  Nobie Putnam, DO [facility] 04/26/2017, 12:35 PM

## 2017-04-27 ENCOUNTER — Telehealth: Payer: Self-pay | Admitting: Family Medicine

## 2017-04-27 NOTE — Telephone Encounter (Signed)
Dr.   Ola Spurr office called states that pt will have appt . Monday 5-14@ 11:15 if pt can make it

## 2017-04-27 NOTE — Telephone Encounter (Signed)
Called patient to confirm since it is after 5pm now on Friday 5/11  Patient states that he will be out of town Monday, and has already rescheduled his apt with Dr Ola Spurr from Monday to Friday 5/18 at 8:30am.  He is appreciative of the phone call to check.  Nobie Putnam, DO [facility] 04/27/2017, 5:08 PM

## 2017-05-04 LAB — HIV ANTIBODY (ROUTINE TESTING W REFLEX): HIV: NONREACTIVE

## 2017-05-22 ENCOUNTER — Ambulatory Visit (INDEPENDENT_AMBULATORY_CARE_PROVIDER_SITE_OTHER): Payer: BLUE CROSS/BLUE SHIELD | Admitting: Family Medicine

## 2017-05-22 ENCOUNTER — Encounter: Payer: Self-pay | Admitting: Family Medicine

## 2017-05-22 VITALS — BP 120/61 | HR 64 | Temp 98.1°F | Resp 16 | Ht 70.0 in | Wt 210.0 lb

## 2017-05-22 DIAGNOSIS — M9901 Segmental and somatic dysfunction of cervical region: Secondary | ICD-10-CM | POA: Diagnosis not present

## 2017-05-22 DIAGNOSIS — R030 Elevated blood-pressure reading, without diagnosis of hypertension: Secondary | ICD-10-CM | POA: Diagnosis not present

## 2017-05-22 DIAGNOSIS — R61 Generalized hyperhidrosis: Secondary | ICD-10-CM

## 2017-05-22 DIAGNOSIS — Z227 Latent tuberculosis: Secondary | ICD-10-CM

## 2017-05-22 DIAGNOSIS — Z8613 Personal history of malaria: Secondary | ICD-10-CM | POA: Diagnosis not present

## 2017-05-22 DIAGNOSIS — R7611 Nonspecific reaction to tuberculin skin test without active tuberculosis: Secondary | ICD-10-CM | POA: Diagnosis not present

## 2017-05-22 DIAGNOSIS — M9900 Segmental and somatic dysfunction of head region: Secondary | ICD-10-CM

## 2017-05-22 DIAGNOSIS — M62838 Other muscle spasm: Secondary | ICD-10-CM

## 2017-05-22 NOTE — Progress Notes (Signed)
Subjective:    Patient ID: Walter Hebert, male    DOB: Mar 17, 1951, 66 y.o.   MRN: [id number]  Walter Hebert is a 66 y.o. male presenting on 05/22/2017 for Night Sweats (improved)  HPI   FOLLOW-UP Fever/Night Sweats / History of Latent TB - RESOLVED - Last visit with me 04/24/17, established care as new PCP, see note for background information with his recent concern on fevers and some night sweats. He was referred to ID Dr Ola Spurr due to concerns with history of latent TB and potential other exposure with malaria with return to [place] recently without prophylaxis. Saw Dr Ola Spurr on 05/04/17, based on exam, history, and recent labs unremarkable he was determined to have very low likelihood of TB, and testing negative for malaria. - Today returns in follow-up about 4 weeks later, states his symptoms have resolved, no further fevers/night sweats. No other related concerns - No unintentional wt loss, has had wt gain in 6 months, +10 lbs - Denies any hemoptysis, dyspnea, chest pain, productive cough, myalgias  FOLLOW-UP Neck Pain / Muscle Spasm: - Last visit 04/24/17, for same problem, see note for background information - Today reports overall much improved with regards to neck pain, feels he is about 50% improved, overall has less pain with movement and rotation, had been worse to the Left - Taking Baclofen rarely, only nightly so far with some relief - Has not taken any OTC meds NSAIDs or Tylenol, no topical muscle rub - He works in Careers adviser, Garment/textile technologist, vinyl, hardwood, 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 He has had some elevated readings occasionally at doctors office, but usually improves on re-check. - Today BP normal. He still has not checked BP outside office Current Meds - None,  never on   Denies CP, dyspnea, HA, edema, dizziness / lightheadedness  Review of Systems  Constitutional: Negative for activity change, appetite change, chills, diaphoresis, fatigue, fever (Resolved) and unexpected weight change.       Resolved night sweats  Respiratory: Negative for cough, choking, chest tightness and shortness of breath.        No hemoptysis  Musculoskeletal: Positive for arthralgias (neck pain, improving) and neck pain.  Hematological: Negative for adenopathy.   Per HPI unless specifically indicated above     Objective:    BP 120/61   Pulse 64   Temp 98.1 F (36.7 C) (Oral)   Resp 16   Ht 5\' 10"  (1.778 m)   Wt 210 lb (95.3 kg)   BMI 30.13 kg/m   Wt Readings from Last 3 Encounters:  05/22/17 210 lb (95.3 kg)  04/24/17 211 lb 6.4 oz (95.9 kg)  04/23/17 200 lb (90.7 kg)    Physical Exam  Constitutional: He is oriented to person, place, and time. He appears well-developed and well-nourished. No distress.  Well-appearing, comfortable, cooperative  HENT:  Head: Normocephalic and atraumatic.  Mouth/Throat: Oropharynx is clear and moist.  Oropharynx clear without erythema, exudates, edema or asymmetry.  Eyes: Conjunctivae and EOM are normal. Pupils are equal, round, and reactive to light.  Neck: No thyromegaly present.  Neck Inspection /  Palpation: no obvious deformity, symmetrical appearance, improved mild upper paraspinal c-spine with hypertonicity and spasm, improved mild tender to palpation near capitus muscles. Non focal, no bony tenderness over spinous  process ROM: Stable to mild improved reduced Left rotation, mostly preserved R rotation. Flex/Ext neck FROM Special Testing: Did not repeat Spurling's last visit negative Strength: 5/5 grip bilateral upper extremities Neurovascular: distally intact  Osteopathic Structural Exam: C2-C4 flexed rotated/sidebent left  Cardiovascular: Normal rate, regular rhythm, normal heart sounds and intact distal pulses.    No murmur heard. Pulmonary/Chest: Effort normal and breath sounds normal. No respiratory distress. He has no wheezes. He has no rales.  Good air movement. No focal abnormality  Lymphadenopathy:    He has no cervical adenopathy.  Neurological: He is alert and oriented to person, place, and time.  Skin: Skin is warm and dry. No rash noted. He is not diaphoretic. No erythema.  Psychiatric: He has a normal mood and affect. His behavior is normal.  Nursing note and vitals reviewed.      Assessment & Plan:   Problem List Items Addressed This Visit    TB lung, latent - Primary    Stable without evidence of activated TB. Resolved symptoms. History of LTBI treatment Rifampin 2015 Followed by ID Dr Ola Spurr previously, and with recent consult reassuring without any evidence clinically of TB      RESOLVED: Night sweats    Resolved Evaluated by ID already, no other concerns or clear dx      History of malaria    No evidence of active malaria infection, recent labs per ID negative Symptoms resolved Prior history dx malaria with travel to [place]      Elevated BP without diagnosis of hypertension    Normal BP today Elevated Cr on prior labs Encouraged to check BP outside office Follow-up as planned for monitoring BP, labs       Other Visit Diagnoses    Muscle spasms of neck      Still improving (>50% better now in 4 weeks) bilateral upper cervical paraspinal vs capitus muscle spasm, likely secondary to 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 No evidence of radicular symptoms or neurological deficits or weakness. - No recent imaging, likely underlying arthritis, has in hands - Still limited conservative treatments at home  Plan: 1. Encouraged to increase use of Baclofen 5-10mg  TID PRN for better symptom control 2. Again emphasized to take Tylenol PRN breakthrough pain - he does not like taking any  medications 3. Use topical heat or muscle rub 4. Relative rest with over-use at work with constant neck flexed. Take breaks to avoid fixed neck position >67min to 1 hour  5. Performed OMT today ([facility] Positional Release FPR Cervical Spine C2-4, Suboccipital Myofascial Release, Soft tissue trapezius treatment) discussed, advised of potential rebound pain, consented, tolerated techniques well with mild significant improvement in ROM 6. Follow-up if not improving - future consider PT referral    Somatic dysfunction of head region       Somatic dysfunction of cervical region          Follow up plan: Return in about 4 months (around 09/21/2017) for Annual Physical.  Future labs ordered.  Nobie Putnam, [facility] 05/23/2017, 5:57 AM

## 2017-05-22 NOTE — Patient Instructions (Signed)
Thank you for coming to the clinic today.  1.  I think your Neck Pain will continue to gradually improve, it is due to muscle spasms it appears, likely related to work and other stress  Try to take Baclofen more often up to 2-3 times daily, caution sedatoin  Use [employer] or [employer], also use moist heat or heating pad on shoulders / neck, and have family member help with soft tissue massage as demonstrated  Recommend to start taking Tylenol Extra Strength 500mg  tabs - take 1 to 2 tabs per dose (max 1000mg ) every 6-8 hours for pain, max 24 hour daily dose is 6 tablets or 3000mg . In the future you can repeat the same everyday Tylenol course for 1-2 weeks at a time.  We can consider Physical Therapy referral if not improving after another 4-6 weeks, notify office and we can order this  You will be due for FASTING BLOOD WORK (no food or drink after midnight before, only water or coffee without cream/sugar on the morning of)  - Please go ahead and schedule a "Lab Only" visit in the morning at the clinic for lab draw in 4 months   If develop severe headaches, nausea / vomiting, loss of vision, numbness, weakness or tingling - call 911 or go immediately to ED.  Please schedule a follow-up appointment with Dr. Parks Ranger in 4 months for Annual Physical  If you have any other questions or concerns, please feel free to call the clinic or send a message through [place]. You may also schedule an earlier appointment if necessary.  Nobie Putnam, DO [place]

## 2017-05-23 ENCOUNTER — Other Ambulatory Visit: Payer: Self-pay | Admitting: Family Medicine

## 2017-05-23 ENCOUNTER — Encounter: Payer: Self-pay | Admitting: Family Medicine

## 2017-05-23 DIAGNOSIS — R799 Abnormal finding of blood chemistry, unspecified: Secondary | ICD-10-CM

## 2017-05-23 DIAGNOSIS — Z0000 Encounter for general adult medical examination without abnormal findings: Secondary | ICD-10-CM

## 2017-05-23 DIAGNOSIS — Z13220 Encounter for screening for lipoid disorders: Secondary | ICD-10-CM

## 2017-05-23 DIAGNOSIS — R7989 Other specified abnormal findings of blood chemistry: Secondary | ICD-10-CM | POA: Insufficient documentation

## 2017-05-23 DIAGNOSIS — Z1159 Encounter for screening for other viral diseases: Secondary | ICD-10-CM

## 2017-05-23 DIAGNOSIS — E669 Obesity, unspecified: Secondary | ICD-10-CM

## 2017-05-23 DIAGNOSIS — Z862 Personal history of diseases of the blood and blood-forming organs and certain disorders involving the immune mechanism: Secondary | ICD-10-CM

## 2017-05-23 DIAGNOSIS — R030 Elevated blood-pressure reading, without diagnosis of hypertension: Secondary | ICD-10-CM

## 2017-05-23 NOTE — Assessment & Plan Note (Addendum)
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 Resolved symptoms. History of LTBI treatment Rifampin 2015 Followed by ID Dr Walter Hebert previously, and with recent consult reassuring without any evidence clinically of TB

## 2017-05-23 NOTE — Assessment & Plan Note (Signed)
Resolved Evaluated by ID already, no other concerns or clear dx

## 2017-05-23 NOTE — Assessment & Plan Note (Signed)
Normal BP today Elevated Cr on prior labs Encouraged to check BP outside office Follow-up as planned for monitoring BP, labs

## 2017-05-23 NOTE — Assessment & Plan Note (Signed)
No evidence of active malaria infection, recent labs per ID negative Symptoms resolved Prior history dx malaria with travel to [place]

## 2017-09-18 ENCOUNTER — Other Ambulatory Visit: Payer: BLUE CROSS/BLUE SHIELD

## 2017-09-18 DIAGNOSIS — R030 Elevated blood-pressure reading, without diagnosis of hypertension: Secondary | ICD-10-CM

## 2017-09-18 DIAGNOSIS — E669 Obesity, unspecified: Secondary | ICD-10-CM

## 2017-09-18 DIAGNOSIS — Z0000 Encounter for general adult medical examination without abnormal findings: Secondary | ICD-10-CM

## 2017-09-18 DIAGNOSIS — R799 Abnormal finding of blood chemistry, unspecified: Secondary | ICD-10-CM

## 2017-09-18 DIAGNOSIS — Z862 Personal history of diseases of the blood and blood-forming organs and certain disorders involving the immune mechanism: Secondary | ICD-10-CM

## 2017-09-18 DIAGNOSIS — Z87898 Personal history of other specified conditions: Secondary | ICD-10-CM

## 2017-09-18 DIAGNOSIS — Z13220 Encounter for screening for lipoid disorders: Secondary | ICD-10-CM

## 2017-09-18 DIAGNOSIS — Z1159 Encounter for screening for other viral diseases: Secondary | ICD-10-CM

## 2017-09-18 DIAGNOSIS — R7989 Other specified abnormal findings of blood chemistry: Secondary | ICD-10-CM

## 2017-09-19 LAB — CBC WITH DIFFERENTIAL/PLATELET
Basophils Absolute: 30 cells/uL (ref 0–200)
Basophils Relative: 0.8 %
EOS PCT: 3.2 %
Eosinophils Absolute: 118 cells/uL (ref 15–500)
HCT: 46.7 % (ref 38.5–50.0)
Hemoglobin: 15.3 g/dL (ref 13.2–17.1)
Lymphs Abs: 2239 cells/uL (ref 850–3900)
MCH: 24.6 pg — AB (ref 27.0–33.0)
MCHC: 32.8 g/dL (ref 32.0–36.0)
MCV: 75.2 fL — ABNORMAL LOW (ref 80.0–100.0)
MONOS PCT: 10.5 %
MPV: 11.2 fL (ref 7.5–12.5)
NEUTROS ABS: 925 {cells}/uL — AB (ref 1500–7800)
Neutrophils Relative %: 25 %
Platelets: 186 10*3/uL (ref 140–400)
RBC: 6.21 10*6/uL — ABNORMAL HIGH (ref 4.20–5.80)
RDW: 14.4 % (ref 11.0–15.0)
Total Lymphocyte: 60.5 %
WBC mixed population: 389 cells/uL (ref 200–950)
WBC: 3.7 10*3/uL — ABNORMAL LOW (ref 3.8–10.8)

## 2017-09-19 LAB — HEMOGLOBIN A1C
EAG (MMOL/L): 6.3 (calc)
HEMOGLOBIN A1C: 5.6 %{Hb} (ref <5.7)
MEAN PLASMA GLUCOSE: 114 (calc)

## 2017-09-19 LAB — LIPID PANEL
CHOL/HDL RATIO: 3.7 (calc) (ref <5.0)
CHOLESTEROL: 254 mg/dL — AB (ref <200)
HDL: 68 mg/dL (ref >40)
LDL Cholesterol (Calc): 163 mg/dL (calc) — ABNORMAL HIGH
NON-HDL CHOLESTEROL (CALC): 186 mg/dL — AB (ref <130)
Triglycerides: 109 mg/dL (ref <150)

## 2017-09-19 LAB — COMPLETE METABOLIC PANEL WITH GFR
AG Ratio: 1.4 (calc) (ref 1.0–2.5)
ALKALINE PHOSPHATASE (APISO): 72 U/L (ref 40–115)
ALT: 27 U/L (ref 9–46)
AST: 26 U/L (ref 10–35)
Albumin: 4.5 g/dL (ref 3.6–5.1)
BUN: 19 mg/dL (ref 7–25)
CO2: 26 mmol/L (ref 20–32)
CREATININE: 0.99 mg/dL (ref 0.70–1.25)
Calcium: 9.7 mg/dL (ref 8.6–10.3)
Chloride: 103 mmol/L (ref 98–110)
GFR, Est African American: 93 mL/min/{1.73_m2} (ref >=60)
GFR, Est Non African American: 80 mL/min/{1.73_m2} (ref >=60)
Globulin: 3.2 g/dL (calc) (ref 1.9–3.7)
Glucose, Bld: 89 mg/dL (ref 65–99)
Potassium: 3.9 mmol/L (ref 3.5–5.3)
Sodium: 138 mmol/L (ref 135–146)
Total Bilirubin: 0.4 mg/dL (ref 0.2–1.2)
Total Protein: 7.7 g/dL (ref 6.1–8.1)

## 2017-09-19 LAB — HEPATITIS C ANTIBODY
Hepatitis C Ab: NONREACTIVE
SIGNAL TO CUT-OFF: 0.02 (ref <1.00)

## 2017-09-21 ENCOUNTER — Encounter: Payer: Self-pay | Admitting: Family Medicine

## 2017-09-21 ENCOUNTER — Ambulatory Visit (INDEPENDENT_AMBULATORY_CARE_PROVIDER_SITE_OTHER): Payer: BLUE CROSS/BLUE SHIELD | Admitting: Family Medicine

## 2017-09-21 VITALS — BP 116/76 | HR 56 | Temp 97.8°F | Resp 16 | Ht 70.0 in | Wt 211.0 lb

## 2017-09-21 DIAGNOSIS — Z23 Encounter for immunization: Secondary | ICD-10-CM | POA: Diagnosis not present

## 2017-09-21 DIAGNOSIS — R7989 Other specified abnormal findings of blood chemistry: Secondary | ICD-10-CM

## 2017-09-21 DIAGNOSIS — N138 Other obstructive and reflux uropathy: Secondary | ICD-10-CM

## 2017-09-21 DIAGNOSIS — N401 Enlarged prostate with lower urinary tract symptoms: Secondary | ICD-10-CM

## 2017-09-21 DIAGNOSIS — R030 Elevated blood-pressure reading, without diagnosis of hypertension: Secondary | ICD-10-CM | POA: Diagnosis not present

## 2017-09-21 DIAGNOSIS — R7309 Other abnormal glucose: Secondary | ICD-10-CM | POA: Diagnosis not present

## 2017-09-21 DIAGNOSIS — E781 Pure hyperglyceridemia: Secondary | ICD-10-CM

## 2017-09-21 DIAGNOSIS — Z0000 Encounter for general adult medical examination without abnormal findings: Secondary | ICD-10-CM

## 2017-09-21 DIAGNOSIS — E785 Hyperlipidemia, unspecified: Secondary | ICD-10-CM | POA: Insufficient documentation

## 2017-09-21 DIAGNOSIS — E669 Obesity, unspecified: Secondary | ICD-10-CM

## 2017-09-21 MED ORDER — ASPIRIN EC 81 MG PO TBEC: 81.0000 mg | DELAYED_RELEASE_TABLET | Freq: Every day | ORAL | Status: DC

## 2017-09-21 NOTE — Assessment & Plan Note (Addendum)
Concern elevated A1c, near Pre-DM at 5.6 Concern with obesity, HLD  Plan:  1. No medications at this time 2. Encourage improved lifestyle - low carb, low sugar diet, reduce portion size - goal to eat higher protein snack/small meal while traveling or earlier and avoid large late night meal after work, limited time exercise - Handout given on carb preferences 3. Follow-up 6 months - consider repeat A1c vs yearly

## 2017-09-21 NOTE — Assessment & Plan Note (Signed)
Uncontrolled cholesterol elevated LDL but good HDL, poor lifestyle limited exercise Last lipid panel 09/2017 Calculated ASCVD 10 yr risk score 8.4%  Plan: 1. Discussion on ASCVD risk reduction strategies - may be candidate for statin, hold on this for now, re-consider 2. Start new ASA 81mg  for primary ASCVD risk reduction 3. Encourage improved lifestyle - low carb/cholesterol, reduce portion size,recommend exercise 4. Follow-up 6 months - likely lipids q 12 mo

## 2017-09-21 NOTE — Progress Notes (Signed)
Subjective:    Patient ID: Walter Hebert, male    DOB: 06/11/1951, 66 y.o.   MRN: [id number]  DWYANE Hebert is a 66 y.o. male presenting on 09/21/2017 for Annual Exam   HPI   Here for Annual Physical and Lab Review.  HYPERLIPIDEMIA / OBESITY BMI >30 - Reports no concerns he knows that he needs to lose some weight by report today - Last lipid panel 09/2017, elevated LDL and total chol - Not on any cholesterol medication at this time Lifestyle - Diet: Tries to eat healthy, but not limiting carbs or any specific foods. Problem with time due to long work hours, often eats larger meal for dinner late at night when arrives home.  - Exercise: None regular. He is active most days laying flooring, lifting and physical work. Limited time for exercise, does work on weekends as well - He is not taking ASA 81, but interested to start this  Elevated BP without HTN Reports no known prior dx HTN. He has checked BP occasionally outside office, normal readings, similar to today. - Today BP normal Current Meds - None, never on  History of Elevated Creatinine: - History of mild elevated Cr 1.26, last 09/2016. He had no known history of CKD - Re-check chemistry with labs now shows normal Cr 0.99 - Does not take NSAIDs  FOLLOW-UP Neck Pain / Muscle Spasm: - Last visit 05/22/17, for same problem, see note for background information - Today now states pain and neck stiffness has resolved. He has good mobility. He improved on Baclofen. Now he is no longer taking this medicine. Has if he needs it. - Not taking Tylenol or NSAIDs anymore  PMH - Elevated PSA with BPH, prior abnormal prostate biopsy - followed by [facility]  Health Maintenance: -Due for Flu Shot, will receive today  Depression screen [facility] 2/9 09/21/2017 09/19/2016  Decreased Interest 0 0  Down, Depressed, Hopeless 0 0  PHQ - 2 Score 0 0    Past Medical History:  Diagnosis Date  . Elevated PSA   . Enlarged prostate   . 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 Night sweats   . Synovial cyst    Past Surgical History:  Procedure Laterality Date  . COLONOSCOPY N/A 05/18/2015   Procedure: COLONOSCOPY;  Surgeon: Lucilla Lame, MD;  Location: [facility] ENDOSCOPY;  Service: Endoscopy;  Laterality: N/A;   Social History   Social History  . Marital status: Married    Spouse name: N/A  . Number of children: N/A  . Years of education: N/A   Occupational History  . Not on file.   Social History Main Topics  . Smoking status: Former Research scientist (life sciences)  . Smokeless tobacco: Never Used     Comment: was a light smoker quit 26 years ago  . Alcohol use No     Comment: quit 26 years ago  . Drug use: No  . Sexual activity: Not on file   Other Topics Concern  . Not on file   Social History Narrative  . No narrative on file   Family History  Problem Relation Age of Onset  . Hyperlipidemia Unknown   . Prostate cancer Brother        DECEASED  . Kidney disease Neg Hx    Current Outpatient Prescriptions on File Prior to Visit  Medication Sig  . Ascorbic Acid (VITAMIN C PO) Take by mouth.  . Multiple Vitamin (  MULTIVITAMIN) capsule Take 1 capsule by mouth daily. Reported on 12/24/2015  . VITAMIN E PO Take by mouth.  . AVANAFIL PO Take 1 tablet by mouth as needed.  . baclofen (LIORESAL) 10 MG tablet Take 0.5-1 tablets (5-10 mg total) by mouth 3 (three) times daily as needed for muscle spasms. Or only at bedtime if needed (Patient not taking: Reported on 09/21/2017)   No current facility-administered medications on file prior to visit.     Review of Systems  Constitutional: Negative for activity change, appetite change, chills, diaphoresis, fatigue, fever and unexpected weight change.  HENT: Negative for congestion, hearing loss and sinus pressure.   Eyes: Negative for visual disturbance.  Respiratory: Negative for apnea, cough, chest tightness, shortness of breath and wheezing.   Cardiovascular:  Negative for chest pain, palpitations and leg swelling.  Gastrointestinal: Negative for abdominal pain, anal bleeding, blood in stool, constipation, diarrhea, nausea and vomiting.  Endocrine: Negative for cold intolerance and polyuria.  Genitourinary: Negative for decreased urine volume, difficulty urinating, dysuria, frequency, hematuria and testicular pain.  Musculoskeletal: Negative for arthralgias, back pain, neck pain (Resolved) and neck stiffness (Resolved).  Skin: Negative for rash.  Allergic/Immunologic: Negative for environmental allergies.  Neurological: Negative for dizziness, weakness, light-headedness, numbness and headaches.  Hematological: Negative for adenopathy.  Psychiatric/Behavioral: Negative for behavioral problems, dysphoric mood and sleep disturbance. The patient is not nervous/anxious.    Per HPI unless specifically indicated above     Objective:    BP 116/76   Pulse (!) 56   Temp 97.8 F (36.6 C) (Oral)   Resp 16   Ht 5\' 10"  (1.778 m)   Wt 211 lb (95.7 kg)   BMI 30.28 kg/m   Wt Readings from Last 3 Encounters:  09/21/17 211 lb (95.7 kg)  05/22/17 210 lb (95.3 kg)  04/24/17 211 lb 6.4 oz (95.9 kg)    Physical Exam  Constitutional: He is oriented to person, place, and time. He appears well-developed and well-nourished. No distress.  Well-appearing, comfortable, cooperative, central obesity  HENT:  Head: Normocephalic and atraumatic.  Mouth/Throat: Oropharynx is clear and moist.  Frontal / maxillary sinuses non-tender. Nares patent without purulence or edema. R TMs clear without erythema, effusion or bulging L TM obscured by soft cerumen. Oropharynx clear without erythema, exudates, edema or asymmetry.  Eyes: Pupils are equal, round, and reactive to light. Conjunctivae and EOM are normal. Right eye exhibits no discharge. Left eye exhibits no discharge.  Neck: Normal range of motion. Neck supple. No thyromegaly present.  Cardiovascular: Normal rate, regular  rhythm, normal heart sounds and intact distal pulses.   No murmur heard. Pulmonary/Chest: Effort normal and breath sounds normal. No respiratory distress. He has no wheezes. He has no rales.  Abdominal: Soft. Bowel sounds are normal. He exhibits no distension and no mass. There is no tenderness.  Genitourinary:  Genitourinary Comments: Deferred DRE, followed by Urology  Musculoskeletal: Normal range of motion. He exhibits no edema or tenderness.  Upper / Lower Extremities: - Normal muscle tone, strength bilateral upper extremities 5/5, lower extremities 5/5  Lymphadenopathy:    He has no cervical adenopathy.  Neurological: He is alert and oriented to person, place, and time.  Distal sensation intact to light touch all extremities  Skin: Skin is warm and dry. No rash noted. He is not diaphoretic. No erythema.  Psychiatric: He has a normal mood and affect. His behavior is normal.  Well groomed, good eye contact, normal speech and thoughts  Nursing note and  vitals reviewed.  Results for orders placed or performed in visit on 09/18/17  COMPLETE METABOLIC PANEL WITH GFR  Result Value Ref Range   Glucose, Bld 89 65 - 99 mg/dL   BUN 19 7 - 25 mg/dL   Creat 0.99 0.70 - 1.25 mg/dL   GFR, Est Non African American 80 > OR = 60 mL/min/1.17m2   GFR, Est African American 93 > OR = 60 mL/min/1.72m2   BUN/Creatinine Ratio NOT APPLICABLE 6 - 22 (calc)   Sodium 138 135 - 146 mmol/L   Potassium 3.9 3.5 - 5.3 mmol/L   Chloride 103 98 - 110 mmol/L   CO2 26 20 - 32 mmol/L   Calcium 9.7 8.6 - 10.3 mg/dL   Total Protein 7.7 6.1 - 8.1 g/dL   Albumin 4.5 3.6 - 5.1 g/dL   Globulin 3.2 1.9 - 3.7 g/dL (calc)   AG Ratio 1.4 1.0 - 2.5 (calc)   Total Bilirubin 0.4 0.2 - 1.2 mg/dL   Alkaline phosphatase (APISO) 72 40 - 115 U/L   AST 26 10 - 35 U/L   ALT 27 9 - 46 U/L  Lipid panel  Result Value Ref Range   Cholesterol 254 (H) <200 mg/dL   HDL 68 >40 mg/dL   Triglycerides 109 <150 mg/dL   LDL Cholesterol  (Calc) 163 (H) mg/dL (calc)   Total CHOL/HDL Ratio 3.7 <5.0 (calc)   Non-HDL Cholesterol (Calc) 186 (H) <130 mg/dL (calc)  CBC with Differential/Platelet  Result Value Ref Range   WBC 3.7 (L) 3.8 - 10.8 Thousand/uL   RBC 6.21 (H) 4.20 - 5.80 Million/uL   Hemoglobin 15.3 13.2 - 17.1 g/dL   HCT 46.7 38.5 - 50.0 %   MCV 75.2 (L) 80.0 - 100.0 fL   MCH 24.6 (L) 27.0 - 33.0 pg   MCHC 32.8 32.0 - 36.0 g/dL   RDW 14.4 11.0 - 15.0 %   Platelets 186 140 - 400 Thousand/uL   MPV 11.2 7.5 - 12.5 fL   Neutro Abs 925 (L) 1,500 - 7,800 cells/uL   Lymphs Abs 2,239 850 - 3,900 cells/uL   WBC mixed population 389 200 - 950 cells/uL   Eosinophils Absolute 118 15 - 500 cells/uL   Basophils Absolute 30 0 - 200 cells/uL   Neutrophils Relative % 25 %   Total Lymphocyte 60.5 %   Monocytes Relative 10.5 %   Eosinophils Relative 3.2 %   Basophils Relative 0.8 %  Hemoglobin A1c  Result Value Ref Range   Hgb A1c MFr Bld 5.6 <5.7 % of total Hgb   Mean Plasma Glucose 114 (calc)   eAG (mmol/L) 6.3 (calc)  Hepatitis C antibody  Result Value Ref Range   Hepatitis C Ab NON-REACTIVE NON-REACTI   SIGNAL TO CUT-OFF 0.02 <1.00      Assessment & Plan:   Problem List Items Addressed This Visit    BPH with obstruction/lower urinary tract symptoms    Followed by [facility] for BPH along with elevated PSA and abnormal prostate biopsy      Elevated BP without diagnosis of hypertension    Again remains stable with normal range BP Home readings infrequent but appropriate Normalized Cr now 0.99 No medication Improve lifestyle with diet, hydration Follow-up as planned monitor BP      Elevated hemoglobin A1c    Concern elevated A1c, near Pre-DM at 5.6 Concern with obesity, HLD  Plan:  1. No medications at this time 2. Encourage improved lifestyle - low carb, low sugar diet,  reduce portion size - goal to eat higher protein snack/small meal while traveling or earlier and avoid large late night meal after work,  limited time exercise - Handout given on carb preferences 3. Follow-up 6 months - consider repeat A1c vs yearly      RESOLVED: Elevated serum creatinine    Resolved. Prior mild elevated Cr 1.26, now repeat 0.99 1 year later Unlikely significant CKD. No other factors, not taking NSAID Advised improve hydration, may take NSAID PRN but limit in future Follow-up yearly CMET Cr trend      Hyperlipidemia    Uncontrolled cholesterol elevated LDL but good HDL, poor lifestyle limited exercise Last lipid panel 09/2017 Calculated ASCVD 10 yr risk score 8.4%  Plan: 1. Discussion on ASCVD risk reduction strategies - may be candidate for statin, hold on this for now, re-consider 2. Start new ASA 81mg  for primary ASCVD risk reduction 3. Encourage improved lifestyle - low carb/cholesterol, reduce portion size,recommend exercise 4. Follow-up 6 months - likely lipids q 12 mo      Relevant Medications   aspirin EC 81 MG tablet   Obesity (BMI 30.0-34.9)    Weight stable, limited regular exercise due to time Counseling on improving diet, lower carb/sm portion, try eat protein snack before arriving home in evening, smaller portion dinner late night Discussed exercise, but limited time due to work hours and travel Follow-up       Other Visit Diagnoses    Annual physical exam    -  Primary   Needs flu shot       Relevant Orders   Flu Vaccine QUAD 36+ mos IM (Completed)      #Neck / Back muscle spasm/pain- Resolved - Has baclofen PRN, avoid strain at work  Meds ordered this encounter  Medications  . aspirin EC 81 MG tablet    Sig: Take 1 tablet (81 mg total) by mouth daily.    Follow up plan: Return in about 6 months (around 03/22/2018) for Diet/Weight, A1c.  Nobie Putnam, [facility] 09/21/2017, 12:49 PM

## 2017-09-21 NOTE — Assessment & Plan Note (Addendum)
Followed by [facility] for BPH along with elevated PSA and abnormal prostate biopsy 

## 2017-09-21 NOTE — Assessment & Plan Note (Signed)
Again remains stable with normal range BP Home readings infrequent but appropriate Normalized Cr now 0.99 No medication Improve lifestyle with diet, hydration Follow-up as planned monitor BP

## 2017-09-21 NOTE — Assessment & Plan Note (Signed)
Resolved. Prior mild elevated Cr 1.26, now repeat 0.99 1 year later Unlikely significant CKD. No other factors, not taking NSAID Advised improve hydration, may take NSAID PRN but limit in future Follow-up yearly CMET Cr trend

## 2017-09-21 NOTE — Patient Instructions (Addendum)
Thank you for coming to the clinic today.  1. Keep up the great work overall!  Try to improve diet since exercise time is limited.  Eat at least 3 meals and 1-2 snacks per day (don't skip breakfast).  Aim for no more than 5 hours between eating. - Tip: If you go >5 hours without eating and become very hungry, your body will supply it's own resources temporarily and you can gain extra weight when you eat.  Diet Recommendations for Preventing Diabetes   Reduce Starchy (carb) foods include: Bread, rice, pasta, potatoes, corn, crackers, bagels, muffins, all baked goods.   Keep eating Protein foods include: Meat, fish, poultry, eggs, dairy foods, and beans such as pinto and kidney beans (beans also provide carbohydrate).   1. Eat at least 3 meals and 1-2 snacks per day. Never go more than 4-5 hours while awake without eating.   2. Limit starchy foods to TWO per meal and ONE per snack. ONE portion of a starchy  food is equal to the following:   - ONE slice of bread (or its equivalent, such as half of a hamburger bun).   - 1/2 cup of a "scoopable" starchy food such as potatoes or rice.   - 1 OUNCE (28 grams) of starchy snacks (crackers or pretzels, look on label).   - 15 grams of carbohydrate as shown on food label.   3. Both lunch and dinner should include a protein food, a carb food, and vegetables.   - Obtain twice as many veg's as protein or carbohydrate foods for both lunch and dinner.   - Try to keep frozen veg's on hand for a quick vegetable serving.     - Fresh or frozen veg's are best.   4. Breakfast should always include protein.    Please schedule a Follow-up Appointment to: Return in about 6 months (around 03/22/2018) for Diet/Weight, A1c.  If you have any other questions or concerns, please feel free to call the clinic or send a message through [place]. You may also schedule an earlier appointment if necessary.  Additionally, you may be receiving a survey about your experience  at our clinic within a few days to 1 week by e-mail or mail. We value your feedback.  Nobie Putnam, DO [place]

## 2017-09-21 NOTE — Assessment & Plan Note (Signed)
Weight stable, limited regular exercise due to time Counseling on improving diet, lower carb/sm portion, try eat protein snack before arriving home in evening, smaller portion dinner late night Discussed exercise, but limited time due to work hours and travel Follow-up

## 2017-10-22 ENCOUNTER — Other Ambulatory Visit: Payer: BLUE CROSS/BLUE SHIELD

## 2017-10-22 DIAGNOSIS — Z87898 Personal history of other specified conditions: Secondary | ICD-10-CM

## 2017-10-23 LAB — PSA: PROSTATE SPECIFIC AG, SERUM: 4.3 ng/mL — AB (ref 0.0–4.0)

## 2017-10-24 NOTE — Progress Notes (Signed)
8:58 AM   Walter Hebert 1951-09-21 [id number]  Referring provider: Ashok Norris, MD No address on file  Chief Complaint  Patient presents with  . Follow-up    Elevated PSA, BPH, and ED    HPI: Patient is a 66 year old [place] male  who presents today for a six month follow up for history of elevated PSA, HGPIN and BPH with LUTS.          Elevated PSA [name] was found to be 8.3 on 12/25/2013.  His most current PSA is 4.5 on 10/22/2017  His brother was diagnosed with fatal prostate cancer.    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 His current PSA is 4.5.  He is being monitored with biannual exams and PSA's.  BPH WITH LUTS His IPSS score today is 3, which is mild lower urinary tract symptomatology.  He is pleased with his quality life due to his urinary symptoms.   His previous IPSS score was 3/1.  His major complaints today are nocturia.  He has had these symptoms for two years.  He is no longer taking tamsulosin and finasteride.   He denies any dysuria, hematuria or suprapubic pain.   He also denies any recent fevers, chills, nausea or vomiting.  He has a family history of PCa, with his brother with fatal prostate cancer.     IPSS    Row Walter Hebert 10/25/17 0800         International Prostate Symptom Score   How often have you had the sensation of not emptying your bladder?  Not at All     How often have you had to urinate less than every two hours?  Less than 1 in 5 times     How often have you found you stopped and started again several times when you urinated?  Not at All     How often have you found it difficult to postpone urination?  Less than 1 in 5 times     How often have you had a weak urinary stream?  Not at All     How often have you had to strain to start urination?  Not at All     How many times did you typically get up at night to urinate?   1 Time     Total IPSS Score  3       Quality of Life due to urinary symptoms   If you were to spend the rest of your life with your urinary condition just the way it is now how would you feel about that?  Pleased        Score:  1-7 Mild 8-19 Moderate 20-35 Severe  Erectile dysfunction His SHIM score is 21, which is mild ED.   His previous SHIM score was 18.  He has been having difficulty with erections for several years.   His major complaint is maintaining.  His libido is preserved.   His risk factors for ED are age and BPH.   He denies any painful erections or curvatures with his erections.   He is still having spontaneous erections.  He has tried PDE5-inhibitors in the past with success.   SHIM    Row Walter Hebert 10/25/17 0848         SHIM: Over the last 6 months:   How do  you rate your confidence that you could get and keep an erection?  Moderate     When you had erections with sexual stimulation, how often were your erections hard enough for penetration (entering your partner)?  Sometimes (about half the time)     During sexual intercourse, how often were you able to maintain your erection after you had penetrated (entered) your partner?  Almost Always or Always     During sexual intercourse, how difficult was it to maintain your erection to completion of intercourse?  Not Difficult     When you attempted sexual intercourse, how often was it satisfactory for you?  Almost Always or Always       SHIM Total Score   SHIM  21        Score: 1-7 Severe ED 8-11 Moderate ED 12-16 Mild-Moderate ED 17-21 Mild ED 22-25 No ED  PMH: Past Medical History:  Diagnosis Date  . Elevated PSA   . Enlarged prostate   . 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 Night sweats   . Synovial cyst     Surgical History: History reviewed. No pertinent surgical history.  Home Medications:  Allergies as of 10/25/2017      Reactions   Chloroquine  Itching   Hydroxychloroquine Sulfate Hives      Medication List        Accurate as of 10/25/17  8:58 AM. Always use your most recent med list.          AVANAFIL PO Take 1 tablet by mouth as needed.   multivitamin capsule Take 1 capsule by mouth daily. Reported on 12/24/2015   VITAMIN C PO Take 1 capsule daily by mouth.   VITAMIN E PO Take by mouth.       Allergies:  Allergies  Allergen Reactions  . Chloroquine Itching  . Hydroxychloroquine Sulfate Hives    Family History: Family History  Problem Relation Age of Onset  . Hyperlipidemia Unknown   . Prostate cancer Brother        DECEASED  . Healthy Father   . Healthy Mother   . Kidney disease Neg Hx     Social History:  reports that  has never smoked. he has never used smokeless tobacco. He reports that he does not drink alcohol or use drugs.  ROS: UROLOGY Frequent Urination?: No Hard to postpone urination?: No Burning/pain with urination?: No Get up at night to urinate?: Yes Leakage of urine?: No Urine stream starts and stops?: No Trouble starting stream?: No Do you have to strain to urinate?: No Blood in urine?: No Urinary tract infection?: No Sexually transmitted disease?: No Injury to kidneys or bladder?: No Painful intercourse?: No Weak stream?: No Erection problems?: No Penile pain?: No Gastrointestinal Nausea?: No Vomiting?: No Indigestion/heartburn?: No Diarrhea?: No Constipation?: No Constitutional Fever: No Night sweats?: No Weight loss?: No Fatigue?: No Skin Skin rash/lesions?: No Itching?: No Eyes Blurred vision?: No Double vision?: No Ears/Nose/Throat Sore throat?: No Sinus problems?: No Hematologic/Lymphatic Swollen glands?: No Easy bruising?: No Cardiovascular Leg swelling?: No Chest pain?: No Respiratory Cough?: No Shortness of breath?: No Endocrine Excessive thirst?: No Musculoskeletal Back pain?: No Joint pain?: No Neurological Headaches?: No Dizziness?:  No Psychologic Depression?: No Anxiety?: No  Physical Exam: BP (!) 158/91   Pulse (!) 54   Ht 5\' 10"  (1.778 m)   Wt 212 lb 6.4 oz (96.3 kg)   BMI 30.48 kg/m  Constitutional: Well nourished. Alert and oriented, No acute distress. HEENT: [place] AT, moist mucus membranes. Trachea midline, no masses. Cardiovascular: No clubbing, cyanosis, or edema. Respiratory: Normal respiratory effort, no increased work of breathing. GI: Abdomen is soft, non tender, non distended, no abdominal masses. Liver and spleen not palpable.  No hernias appreciated.  Stool sample for occult testing is not indicated.   GU: No CVA tenderness.  No bladder fullness or masses.  Patient with circumcised phallus.  Urethral meatus is patent.  No penile discharge. No penile lesions or rashes. Scrotum without lesions, cysts, rashes and/or edema.  Testicles are located scrotally bilaterally. No masses are appreciated in the testicles. Left and right epididymis are normal. Rectal: Patient with  normal sphincter tone. Anus and perineum without scarring or rashes. No rectal masses are appreciated. Prostate is approximately 60 grams, no nodules are appreciated. Seminal vesicles are normal. Skin: No rashes, bruises or suspicious lesions. Lymph: No cervical or inguinal adenopathy. Neurologic: Grossly intact, no focal deficits, moving all 4 extremities. Psychiatric: Normal mood and affect.  Laboratory Data: PSA History:    8.3 ng/mL on 12/25/2013    5.2 ng/mL on 11/02/2014    6.3 ng/mL on 06/23/2015    4.5 ng/mL on 09/22/2015    4.5 ng/mL on 12/17/2015    4.2 ng/mL on 10/20/2016    4.3 ng/mL on 10/22/2017  Assessment & Plan:    1. History of elevated PSA  - RTC in 6 months for PSA and exam  2. HGPIN  - Found in two cores on the May 2015 biopsy.  PSA at time of biopsy was 8.3 ng/mL  - We will continue to monitor closely, RTC in 6 months for PSA and exam  3. BPH with LUTS  - IPSS score is 3/1, it is stable  - Continue  conservative management, avoiding bladder irritants and timed voiding's  - RTC in 6 months for IPSS, PSA and exam   4. Erectile dysfunction  - SHIM score is 21, it is improving  - Continue Stendra; refill not needed at this time  - RTC in 6 months for repeat SHIM score and exam   Return in about 6 months (around 04/24/2018) for IPSS, SHIM, PSA and exam.  Zara Council, [facility]  [place] [address], [place], [place] [address] [phone]

## 2017-10-25 ENCOUNTER — Ambulatory Visit: Payer: BLUE CROSS/BLUE SHIELD | Admitting: Urology

## 2017-10-25 ENCOUNTER — Encounter: Payer: Self-pay | Admitting: Urology

## 2017-10-25 VITALS — BP 158/91 | HR 54 | Ht 70.0 in | Wt 212.4 lb

## 2017-10-25 DIAGNOSIS — N401 Enlarged prostate with lower urinary tract symptoms: Secondary | ICD-10-CM | POA: Diagnosis not present

## 2017-10-25 DIAGNOSIS — Z87898 Personal history of other specified conditions: Secondary | ICD-10-CM

## 2017-10-25 DIAGNOSIS — N138 Other obstructive and reflux uropathy: Secondary | ICD-10-CM | POA: Diagnosis not present

## 2017-10-25 DIAGNOSIS — N529 Male erectile dysfunction, unspecified: Secondary | ICD-10-CM | POA: Diagnosis not present

## 2017-10-25 DIAGNOSIS — N4231 Prostatic intraepithelial neoplasia: Secondary | ICD-10-CM | POA: Diagnosis not present

## 2018-02-19 ENCOUNTER — Other Ambulatory Visit: Payer: Self-pay

## 2018-02-19 ENCOUNTER — Ambulatory Visit: Payer: BLUE CROSS/BLUE SHIELD | Admitting: Family Medicine

## 2018-02-19 ENCOUNTER — Encounter: Payer: Self-pay | Admitting: Family Medicine

## 2018-02-19 VITALS — BP 143/77 | HR 57 | Temp 98.1°F | Ht 70.0 in | Wt 217.0 lb

## 2018-02-19 DIAGNOSIS — F5101 Primary insomnia: Secondary | ICD-10-CM

## 2018-02-19 NOTE — Patient Instructions (Addendum)
Thank you for coming to the office today.  1.   Start Melatonin OTC 1mg  nightly 30 min before bed, after 1 week may increase to 2 pills, and eventually keep increasing up to 5mg  dose nightly if needed, can take every night until sleeping well then may reduce or stop in future  Sleep Hygiene Recommendations to promote healthy sleep in all patients, especially if symptoms of insomnia are worsening. Due to the nature of sleep rhythms, if your body gets "out of rhythm", it may take some time before your sleep cycle can be "reset".  Please try to follow as many of the following tips as you can, usually there are only a few of these are the primary cause of the problem.  ?To reset your sleep rhythm, go to bed and get up at the same time every day ?Sleep only long enough to feel rested and then get out of bed ?Do not try to force yourself to sleep. If you can't sleep, get out of bed and try again later. ?Avoid naps during the day, unless excessively tired. The more sleeping during the day, then the less sleep your body needs at night.  ?Have coffee, tea, and other foods that have caffeine only in the morning ?Exercise several days a week, but not right before bed ?If you drink alcohol, prefer to have appropriate drink with one meal, but prefer to avoid alcohol in the evening, and bedtime ?If you smoke, avoid smoking, especially in the evening  ?Avoid watching TV or looking at phones, computers, or reading devices ("e-books") that give off light at least 30 minutes before bed. This artificial light sends "awake signals" to your brain and can make it harder to fall asleep. ?Make your bedroom a comfortable place where it is easy to fall asleep: ? Put up shades or special blackout curtains to block light from outside. ? Use a white noise machine to block noise. ? Keep the temperature cool. ?Try your best to solve or at least address your problems before you go to bed ?Use relaxation techniques to  manage stress. Ask your health care provider to suggest some techniques that may work well for you. These may include: ? Breathing exercises. ? Routines to release muscle tension. ? Visualizing peaceful scenes.   IF melatonin is not helping and we need to try rx medicines here are the list of meds we would consider:  Call office to request one of the following -   1. Hydroxyzine - take one pill 30 min before bedtime, may repeat dose later in night within 30 min to 4 hours if wake up, only use if cannot sleep, as needed only (this is cousin to Benadryl, may have mild side effects of dry mouth among others)  2. Trazodone - take one 50mg  pill about 30 min before bedtime, may take a few doses before it starts to work better, you can take it every night for up to 1 week until you are sleeping better, then may reduce or stop and use as needed only, in future if need we can increase dose from 1 pill to (one and half pill or even two pills if need) - caution risk of long lasting erection on this medicine (unusual side effect, may last >4 hours then it is considered an emergency need to go to hospital to get this to go down)  3. Ambien (Zolpidem) - this is strongest type of sleeping pill, will cause you to fall asleep fairly quickly within  30 min of use, only use if not sleeping for few nights and it is not improving, and nothing else is working. Caution it may make you drowsy and sleepy next day, be careful with sedation after using this medicine. Do not take if it is too late in evening after 3 or 4 am as you will likely be unable to work that day.  Please schedule a Follow-up Appointment to: Return in about 4 weeks (around 03/19/2018) for keep apt in 4 weeks as scheduled, review insomnia in addition to already scheduled apt.  If you have any other questions or concerns, please feel free to call the office or send a message through [place]. You may also schedule an earlier appointment if  necessary.  Additionally, you may be receiving a survey about your experience at our office within a few days to 1 week by e-mail or mail. We value your feedback.  Nobie Putnam, DO [place]

## 2018-02-19 NOTE — Assessment & Plan Note (Signed)
Clinically consistent with a chronic intermittent insomnia, with both sleep onset and sleep maintenance problems In past was more related to BPH and nocturia, but seems not be primary issue anymore Not consistent with depression or anxiety No other obvious physical etiology Sleep hygiene is fair Seems primary insomnia  Plan Reviewed insomnia dx and treatment Reviewed Sleep Hygiene handout printed, advised avoid TV 30 min before bed, may get out of bed if cant sleep read bible briefly Start OTC Melatonin 1mg  then gradual titrate up to 5mg  take nightly for now may adjust dose as needed Reviewed other meds as backup plan if not improving may contact office can try hydroxyzine, then trazodone, and lastly ambien if need - reviewed potential side effects and benefits counseled per AVS  Follow-up 4 weeks to see progress on melatonin

## 2018-02-19 NOTE — Progress Notes (Signed)
Subjective:    Patient ID: Walter Hebert, male    DOB: 1951/01/07, 67 y.o.   MRN: [id number]  Walter Hebert is a 67 y.o. male presenting on 02/19/2018 for Insomnia (Trouble falling asleep and staying asleep for the past week.)   HPI   INSOMNIA Reports symptoms with difficulty sleeping for several years intermittently, last reviewed by prior PCP in 2017, he states was treated with OTC Melatonin for short while with good results, he then did well for long time without medicine - Now he complains of recent worsening insomnia with x 5 days with poor sleep and he was able to sleep last night due to being "so tired", he usually goes to bed around 10pm, and will lay there awake for about 1 hour until he falls asleep, then usually he will stay asleep for about 2-3 hours until wakes up and will go to bathroom to urinate, then will lay back in bed with poor sleep for another few hours until it is already time in AM to wake up. 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 - He is no longer taking any sleep medicine, in past has tried tylenol PM briefly, no longer taking melatonin, never on SSRI trazodone or other similar anti depressant meds, never on Ambien - He is somewhat familiar with sleep hygiene, he has tried before going to read his bible after waking up before and he will start to doze off after 20 min but he does not always get ou to of bed, he will usually watch news on TV for 30 to 45 min before going to bed, no other artificial light, he is active during day, no caffeine or alcohol - History of BPH with LUTS and nocturia, currently followed by [facility] has been treated with Flomax and Finasteride for up to 2 years with improvement, then stopped both medicines and has done well off of these, last seen within past 4-6 months - Denies depression,  anxiety, see PHQ score below - he does admit to some occasional daily stress anxieties with [place], but this is not all the time, sometimes this will keep him awake - Denies pain, restless leg, apnea or snoring or breathing difficulty coughing or other cause keeping him awake  Depression screen [facility] 2/9 02/19/2018 09/21/2017 09/19/2016  Decreased Interest 0 0 0  Down, Depressed, Hopeless 0 0 0  PHQ - 2 Score 0 0 0  Altered sleeping 0 - -  Tired, decreased energy 0 - -  Change in appetite 0 - -  Feeling bad or failure about yourself  0 - -  Trouble concentrating 0 - -  Moving slowly or fidgety/restless 0 - -  Suicidal thoughts 0 - -  PHQ-9 Score 0 - -  Difficult doing work/chores Not difficult at all - -   No flowsheet data found.   Social History   Tobacco Use  . Smoking status: Never Smoker  . Smokeless tobacco: Never Used  Substance Use Topics  . Alcohol use: No    Comment: Quit- 1990  . Drug use: No    Review of Systems Per HPI unless specifically indicated above     Objective:    BP (!) 143/77 (BP Location: Left Arm, Patient Position: Sitting, Cuff Size: Normal)   Pulse (!) 57  Temp 98.1 F (36.7 C)   Ht 5\' 10"  (1.778 m)   Wt 217 lb (98.4 kg)   BMI 31.14 kg/m   Wt Readings from Last 3 Encounters:  02/19/18 217 lb (98.4 kg)  10/25/17 212 lb 6.4 oz (96.3 kg)  09/21/17 211 lb (95.7 kg)    Physical Exam  Constitutional: He is oriented to person, place, and time. He appears well-developed and well-nourished. No distress.  Slightly tired appearing, comfortable, cooperative  HENT:  Head: Normocephalic and atraumatic.  Mouth/Throat: Oropharynx is clear and moist.  Eyes: Conjunctivae are normal. Right eye exhibits no discharge. Left eye exhibits no discharge.  Neck: Normal range of motion. Neck supple. No thyromegaly present.  Cardiovascular: Normal rate, regular rhythm, normal heart sounds and intact distal pulses.  No murmur  heard. Pulmonary/Chest: Effort normal and breath sounds normal. No respiratory distress. He has no wheezes. He has no rales.  Musculoskeletal: Normal range of motion. He exhibits no edema.  Lymphadenopathy:    He has no cervical adenopathy.  Neurological: He is alert and oriented to person, place, and time.  Skin: Skin is warm and dry. No rash noted. He is not diaphoretic. No erythema.  Psychiatric: He has a normal mood and affect. His behavior is normal.  Well groomed, good eye contact, normal speech and thoughts  Nursing note and vitals reviewed.      Assessment & Plan:   Problem List Items Addressed This Visit    Insomnia - Primary    Clinically consistent with a chronic intermittent insomnia, with both sleep onset and sleep maintenance problems In past was more related to BPH and nocturia, but seems not be primary issue anymore Not consistent with depression or anxiety No other obvious physical etiology Sleep hygiene is fair Seems primary insomnia  Plan Reviewed insomnia dx and treatment Reviewed Sleep Hygiene handout printed, advised avoid TV 30 min before bed, may get out of bed if cant sleep read bible briefly Start OTC Melatonin 1mg  then gradual titrate up to 5mg  take nightly for now may adjust dose as needed Reviewed other meds as backup plan if not improving may contact office can try hydroxyzine, then trazodone, and lastly ambien if need - reviewed potential side effects and benefits counseled per AVS  Follow-up 4 weeks to see progress on melatonin         No orders of the defined types were placed in this encounter.    Follow up plan: Return in about 4 weeks (around 03/19/2018) for keep apt in 4 weeks as scheduled, review insomnia in addition to already scheduled apt.  A total of >25 minutes was spent face-to-face with this patient. Greater than 50% of this time was spent in counseling on dx insomnia and treatment options medications, sleep hygiene.   Nobie Putnam, [facility] 02/19/2018, 9:27 AM

## 2018-03-22 ENCOUNTER — Encounter: Payer: Self-pay | Admitting: Family Medicine

## 2018-03-22 ENCOUNTER — Ambulatory Visit: Payer: BLUE CROSS/BLUE SHIELD | Admitting: Family Medicine

## 2018-03-22 VITALS — BP 138/70 | HR 51 | Temp 97.7°F | Resp 16 | Ht 70.0 in | Wt 217.5 lb

## 2018-03-22 DIAGNOSIS — F5101 Primary insomnia: Secondary | ICD-10-CM | POA: Diagnosis not present

## 2018-03-22 DIAGNOSIS — R030 Elevated blood-pressure reading, without diagnosis of hypertension: Secondary | ICD-10-CM

## 2018-03-22 DIAGNOSIS — R7309 Other abnormal glucose: Secondary | ICD-10-CM | POA: Diagnosis not present

## 2018-03-22 LAB — POCT GLYCOSYLATED HEMOGLOBIN (HGB A1C): Hemoglobin A1C: 6.3 — AB (ref ?–5.7)

## 2018-03-22 NOTE — Patient Instructions (Addendum)
Thank you for coming to the office today.  A1c 6.3  Try to work on improving diet, low carb low starch low sugar, drink more water, less soda  Smaller portion size  Elevated BP, check BP outside office to get some readings, goal < 140 / 90  May take melatonin as needed  Please schedule a Follow-up Appointment to: Return in about 3 months (around 06/21/2018) for PreDM A1c, BP check.  If you have any other questions or concerns, please feel free to call the office or send a message through [place]. You may also schedule an earlier appointment if necessary.  Additionally, you may be receiving a survey about your experience at our office within a few days to 1 week by e-mail or mail. We value your feedback.  Nobie Putnam, DO [place]

## 2018-03-22 NOTE — Assessment & Plan Note (Signed)
Increased elevated A1c from 5.6 to 6.3 now, significant concern for PreDM possible progression to DM Not changed lifestyle Concern with obesity, HLD  Plan:  - Discussion on diagnosis of PreDM vs DM, carbs/starches/sugar, lifestyle, complications of DM, management 1. No medications at this time 2. Encourage improved lifestyle - low carb, low sugar diet, reduce portion size - Handout given on carb preferences, emphasized importance of diet and may add exercise, did not adhere to changes last time 3. Follow-up 3 months PreDM A1c

## 2018-03-22 NOTE — Assessment & Plan Note (Signed)
Improved now on melatonin PRN No other factors for insomnia at this time, not pain or BPH or anxiety or depression Recommend continue sleep hygiene improvements and may restart melatonin if needed Follow-up as needed, reconsider rx sleep meds in future if continued issue

## 2018-03-22 NOTE — Assessment & Plan Note (Signed)
Still mildly elevated initial BP, repeat manual check improved to SBP >135, still a concern, review past readings at risk of new dx HTN. - Home BP readings none available  No known complications    Plan:  1. Discussed Pre-HTN vs HTN and may need medicines, lifestyle management 2. Encourage improved lifestyle - low sodium diet, may add regular exercise in addition to regular work 3. Handout given start monitor BP outside office, bring readings to next visit, if persistently >140/90 or new symptoms notify office sooner 4. Follow-up 3 months repeat BP if still elevated may start initial med possible low dose ACEi given blood sugar

## 2018-03-22 NOTE — Progress Notes (Signed)
Subjective:    Patient ID: Walter Hebert, male    DOB: 02/06/1951, 67 y.o.   MRN: [id number]  Walter Hebert is a 67 y.o. male presenting on 03/22/2018 for Pre-Diabetes; Elevated Blood Pressure; and Insomnia   HPI   Follow-up Insomnia - Last visit with me 02/19/18, for same problem with insomnia, treated with reviewed sleep hygiene and recommend OTC Melatonin 1mg  nightly , see prior notes for background information. - Interval update with seems to have improved on melatonin - Today patient reports doing well, feels more rested. He is no longer taking melatonin regularly, only as needed now Denies tired, worse insomnia, sadness, mood, anxiety  Elevated A1c / OBESITY BMI >31 Last visit for annual 09/2017, he had elevated A1c 5.6, no prior issue before. Today he has not changed much lifestyle. His A1c is higher 6.3 Lifestyle - Weight stable in 1 month, but gain 4-5 lbs in 6 months - Diet: He was not trying to avoid carbs or follow particular diet. Drinks water mostly some soda - Exercise: None regular. He is active most days laying flooring, lifting and physical work. Limited time for exercise  Elevated BP without HTN Reports no known prior dx HTN. He has past elevated readings. No cuff at home, but will get one Current Meds - None, never on - Not eating much salt added. Does not eat out as much - Drinks occasionally has soda, otherwise not much caffeine  Depression screen [facility] 2/9 03/22/2018 02/19/2018 09/21/2017  Decreased Interest 0 0 0  Down, Depressed, Hopeless 0 0 0  PHQ - 2 Score 0 0 0  Altered sleeping - 0 -  Tired, decreased energy - 0 -  Change in appetite - 0 -  Feeling bad or failure about yourself  - 0 -  Trouble concentrating - 0 -  Moving slowly or fidgety/restless - 0 -  Suicidal thoughts - 0 -  PHQ-9 Score - 0 -  Difficult doing work/chores Not difficult at all Not difficult at all -    Social History   Tobacco Use  . Smoking status: Never Smoker  . Smokeless tobacco:  Never Used  Substance Use Topics  . Alcohol use: No    Comment: Quit- 1990  . Drug use: No    Review of Systems Per HPI unless specifically indicated above     Objective:    BP 138/70 (BP Location: Left Arm, Cuff Size: Normal)   Pulse (!) 51   Temp 97.7 F (36.5 C) (Oral)   Resp 16   Ht 5\' 10"  (1.778 m)   Wt 217 lb 8 oz (98.7 kg)   BMI 31.21 kg/m   Wt Readings from Last 3 Encounters:  03/22/18 217 lb 8 oz (98.7 kg)  02/19/18 217 lb (98.4 kg)  10/25/17 212 lb 6.4 oz (96.3 kg)    Physical Exam  Constitutional: He is oriented to person, place, and time. He appears well-developed and well-nourished. No distress.  Well-appearing, comfortable, cooperative  HENT:  Head: Normocephalic and atraumatic.  Mouth/Throat: Oropharynx is clear and moist.  Eyes: Conjunctivae are normal. Right eye exhibits no discharge. Left eye exhibits no discharge.  Neck: Normal range of motion. Neck supple. No thyromegaly present.  Cardiovascular: Normal rate, regular rhythm, normal heart sounds and intact distal pulses.  No murmur heard. Pulmonary/Chest: Effort normal.  Musculoskeletal: Normal range of motion. He exhibits no edema.  Neurological: He is alert and oriented to person, place, and time.  Skin: Skin is warm  and dry. No rash noted. He is not diaphoretic. No erythema.  Psychiatric: He has a normal mood and affect. His behavior is normal.  Well groomed, good eye contact, normal speech and thoughts  Nursing note and vitals reviewed.  Results for orders placed or performed in visit on 03/22/18  POCT HgB A1C  Result Value Ref Range   Hemoglobin A1C 6.3 (A) 5.7   Recent Labs    09/18/17 0802 03/22/18 0859  HGBA1C 5.6 6.3*       Assessment & Plan:   Problem List Items Addressed This Visit    Elevated BP without diagnosis of hypertension    Still mildly elevated initial BP, repeat manual check improved to SBP >135, still a concern, review past readings at risk of new dx HTN. - Home  BP readings none available  No known complications    Plan:  1. Discussed Pre-HTN vs HTN and may need medicines, lifestyle management 2. Encourage improved lifestyle - low sodium diet, may add regular exercise in addition to regular work 3. Handout given start monitor BP outside office, bring readings to next visit, if persistently >140/90 or new symptoms notify office sooner 4. Follow-up 3 months repeat BP if still elevated may start initial med possible low dose ACEi given blood sugar      Elevated hemoglobin A1c - Primary    Increased elevated A1c from 5.6 to 6.3 now, significant concern for PreDM possible progression to DM Not changed lifestyle Concern with obesity, HLD  Plan:  - Discussion on diagnosis of PreDM vs DM, carbs/starches/sugar, lifestyle, complications of DM, management 1. No medications at this time 2. Encourage improved lifestyle - low carb, low sugar diet, reduce portion size - Handout given on carb preferences, emphasized importance of diet and may add exercise, did not adhere to changes last time 3. Follow-up 3 months PreDM A1c      Relevant Orders   POCT HgB A1C (Completed)   Insomnia    Improved now on melatonin PRN No other factors for insomnia at this time, not pain or BPH or anxiety or depression Recommend continue sleep hygiene improvements and may restart melatonin if needed Follow-up as needed, reconsider rx sleep meds in future if continued issue         No orders of the defined types were placed in this encounter.   Follow up plan: Return in about 3 months (around 06/21/2018) for PreDM A1c, BP check.  A total of >25 minutes was spent face-to-face with this patient. Greater than 50% of this time was spent in counseling on Pre-DM and Pre-HTN and future progression to DM and HTN, complications, management options, lifestyle adjustment.  Walter Hebert, [facility] 03/22/2018, 9:05 AM

## 2018-04-01 ENCOUNTER — Ambulatory Visit: Payer: BLUE CROSS/BLUE SHIELD

## 2018-04-01 VITALS — BP 129/71 | HR 55 | Ht 70.0 in | Wt 217.0 lb

## 2018-04-01 DIAGNOSIS — R030 Elevated blood-pressure reading, without diagnosis of hypertension: Secondary | ICD-10-CM

## 2018-04-16 ENCOUNTER — Other Ambulatory Visit: Payer: Self-pay | Admitting: Family Medicine

## 2018-04-16 DIAGNOSIS — Z87898 Personal history of other specified conditions: Secondary | ICD-10-CM

## 2018-04-17 ENCOUNTER — Other Ambulatory Visit: Payer: BLUE CROSS/BLUE SHIELD

## 2018-04-17 DIAGNOSIS — Z87898 Personal history of other specified conditions: Secondary | ICD-10-CM

## 2018-04-18 ENCOUNTER — Telehealth: Payer: Self-pay

## 2018-04-18 LAB — PSA: Prostate Specific Ag, Serum: 6.2 ng/mL — ABNORMAL HIGH (ref 0.0–4.0)

## 2018-04-18 NOTE — Telephone Encounter (Signed)
Orders added w/ [facility], please reschedule appointment per [facility].

## 2018-04-18 NOTE — Telephone Encounter (Signed)
-----   Message from Nori Riis, PA-C sent at 04/18/2018 10:22 AM EDT ----- Please add a free and total PSA to his blood work.  He will also need to be rescheduled as he has an appointment on the 8th to see me.

## 2018-04-19 ENCOUNTER — Telehealth: Payer: Self-pay | Admitting: Urology

## 2018-04-19 NOTE — Telephone Encounter (Signed)
We have been trying to reach him to reschd no response from the patient. We will keep trying.   Sharyn Lull

## 2018-04-22 LAB — PSA, TOTAL AND FREE
PSA, Free Pct: 19.2 %
PSA, Free: 1.15 ng/mL
Prostate Specific Ag, Serum: 6 ng/mL — ABNORMAL HIGH (ref 0.0–4.0)

## 2018-04-22 LAB — SPECIMEN STATUS REPORT

## 2018-04-24 ENCOUNTER — Ambulatory Visit: Payer: BLUE CROSS/BLUE SHIELD | Admitting: Urology

## 2018-05-28 NOTE — Progress Notes (Signed)
9:08 AM   Walter Hebert 1951-11-02 [id number]  Referring provider: Olin Hauser, DO [address] [place],  [id number]  No chief complaint on file.   HPI: Patient is a 67 year old [place] male  who presents today for a six month follow up for history of elevated PSA, HGPIN and BPH with LUTS.   Patient is a 67 year old [place] male with a history of elevated PSA, HGPIN and BPH with LU TS who presents today for follow up.  Elevated PSA [name] was found to be 8.3 on 12/25/2013.  His most current PSA is 4.5 on 10/22/2017  His brother was diagnosed with fatal prostate cancer.  His current PSA is 6.0.  He has not been taking his finasteride in over one year.    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 His current PSA is 6.0.  He is being monitored with biannual exams and PSA's.  BPH WITH LUTS  (prostate and/or bladder) IPSS score: 1/0    Previous score: 3/1     Major complaint(s):  Nocturia x 1 x many years. Denies any dysuria, hematuria or suprapubic pain.   Denies any recent fevers, chills, nausea or vomiting.  His brother was diagnosed with fatal prostate cancer.    IPSS    Row Name 05/29/18 0800         International Prostate Symptom Score   How often have you had to urinate less than every two hours?  Not at All     How often have you found you stopped and started again several times when you urinated?  Not at All     How often have you found it difficult to postpone urination?  Not at All     How often have you had a weak urinary stream?  Not at All     How often have you had to strain to start urination?  Not at All     How many times did you typically get up at night to urinate?  1 Time     Total IPSS Score  1       Quality of Life due to urinary symptoms   If you were to spend the rest of your life with your urinary condition just the  way it is now how would you feel about that?  Delighted        Score:  1-7 Mild 8-19 Moderate 20-35 Severe   Erectile dysfunction His SHIM score is 22, which is no ED.   His previous SHIM score was 21.  He has been having difficulty with erections for many years.   His major complaint is in getting an erection.  His libido is preserved.   His risk factors for ED are age and BPH.  He denies any painful erections or curvatures with his erections.   He is still having an occasional spontaneous erections.  He has tried PDE5i in the past with success.   SHIM    Row Name 05/29/18 0840         SHIM: Over the last 6 months:   How do you rate your confidence that you could get and keep an erection?  Moderate     When you had erections with sexual stimulation, how often were your erections hard enough for penetration (entering your partner)?  Most Times (much more than half the time)     During sexual intercourse, how often were you able to maintain your erection after you had penetrated (entered) your partner?  Almost Always or Always     During sexual intercourse, how difficult was it to maintain your erection to completion of intercourse?  Not Difficult     When you attempted sexual intercourse, how often was it satisfactory for you?  Almost Always or Always       SHIM Total Score   SHIM  22        Score: 1-7 Severe ED 8-11 Moderate ED 12-16 Mild-Moderate ED 17-21 Mild ED 22-25 No ED    PMH: Past Medical History:  Diagnosis Date  . Elevated PSA   . Enlarged prostate   . 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 Night sweats   . Synovial cyst     Surgical History: Past Surgical History:  Procedure Laterality Date  . COLONOSCOPY N/A 05/18/2015   Procedure: COLONOSCOPY;  Surgeon: Lucilla Lame, MD;  Location: [facility] ENDOSCOPY;  Service: Endoscopy;  Laterality: N/A;    Home Medications:  Allergies as of 05/29/2018       Reactions   Chloroquine Itching   Hydroxychloroquine Sulfate Hives      Medication List        Accurate as of 05/29/18  9:08 AM. Always use your most recent med list.          AVANAFIL PO Take 1 tablet by mouth as needed.   diazepam 10 MG tablet Commonly known as:  VALIUM Take tablet 30 minutes prior to the MRI   multivitamin capsule Take 1 capsule by mouth daily. Reported on 12/24/2015   VITAMIN C PO Take 1 capsule daily by mouth.   VITAMIN E PO Take by mouth.       Allergies:  Allergies  Allergen Reactions  . Chloroquine Itching  . Hydroxychloroquine Sulfate Hives    Family History: Family History  Problem Relation Age of Onset  . Hyperlipidemia Unknown   . Prostate cancer Brother        DECEASED  . Healthy Father   . Healthy Mother   . Kidney disease Neg Hx     Social History:  reports that he has never smoked. He has never used smokeless tobacco. He reports that he does not drink alcohol or use drugs.  ROS: UROLOGY Frequent Urination?: No Hard to postpone urination?: No Burning/pain with urination?: No Get up at night to urinate?: Yes Leakage of urine?: No Urine stream starts and stops?: No Trouble starting stream?: No Do you have to strain to urinate?: No Blood in urine?: No Urinary tract infection?: No Sexually transmitted disease?: No Injury to kidneys or bladder?: No Painful intercourse?: No Weak stream?: No Erection problems?: No Penile pain?: No Gastrointestinal Nausea?: No Vomiting?: No Indigestion/heartburn?: No Diarrhea?: No Constipation?: No Constitutional Fever: No Night sweats?: No Weight loss?: No Fatigue?: No Skin Skin rash/lesions?: No Itching?: No Eyes Blurred vision?: No Double vision?: No Ears/Nose/Throat Sore throat?: No Sinus problems?: No Hematologic/Lymphatic Swollen glands?: No Easy bruising?: No Cardiovascular Leg swelling?: No Chest pain?: No Respiratory Cough?: No Shortness of breath?:  No Endocrine Excessive thirst?: No Musculoskeletal Back pain?: No Joint pain?: No Neurological Headaches?: No Dizziness?: No Psychologic Depression?: No Anxiety?: No  Physical Exam: BP 132/73 (BP Location: Left Arm, Patient Position: Sitting, Cuff Size: Large)   Pulse (!) 51  Ht 5\' 10"  (1.778 m)   Wt 206 lb 8 oz (93.7 kg)   BMI 29.63 kg/m   Constitutional: Well nourished. Alert and oriented, No acute distress. HEENT: [place] AT, moist mucus membranes. Trachea midline, no masses. Cardiovascular: No clubbing, cyanosis, or edema. Respiratory: Normal respiratory effort, no increased work of breathing. GI: Abdomen is soft, non tender, non distended, no abdominal masses. Liver and spleen not palpable.  No hernias appreciated.  Stool sample for occult testing is not indicated.   GU: No CVA tenderness.  No bladder fullness or masses.  Patient with circumcised phallus.   Urethral meatus is patent.  No penile discharge. No penile lesions or rashes. Scrotum without lesions, cysts, rashes and/or edema.  Testicles are located scrotally bilaterally. No masses are appreciated in the testicles. Left and right epididymis are normal. Rectal: Patient with  normal sphincter tone. Anus and perineum without scarring or rashes. No rectal masses are appreciated. Prostate is approximately 60 grams, no nodules are appreciated. Seminal vesicles are normal. Skin: No rashes, bruises or suspicious lesions. Lymph: No cervical or inguinal adenopathy. Neurologic: Grossly intact, no focal deficits, moving all 4 extremities. Psychiatric: Normal mood and affect.   Laboratory Data: PSA History:    8.3 ng/mL on 12/25/2013    5.2 ng/mL on 11/02/2014    6.3 ng/mL on 06/23/2015    4.5 ng/mL on 09/22/2015    4.5 ng/mL on 12/17/2015    4.2 ng/mL on 10/20/2016    4.3 ng/mL on 10/22/2017    6.0 ng/mL on 04/17/2018 - 17% probability of prostate cancer I have reviewed the labs.  Assessment & Plan:    1. Elevated  PSA [name] has increased substantially over the last 6 months - this may be due to his stopping the finasteride but it may also be concerning for prostate cancer - I have advised the patient to have an MRI of the prostate or a prostate biopsy at this time After discussion regarding the risks of having a prostate biopsy, patient would like to undergo a MRI, I advised the patient that the MRI would only give [place] more information regarding his prostate and he may still need to have a biopsy for a pathological diagnosis - his questions are answered and he would like to schedule an MRI of the prostate at this time   2. 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  PSA at time of biopsy was 8.3 ng/mL Scheduled for a prostate MRI  3. BPH with LUTS I PSS score is 1/0, it is improved Continue conservative management, timed voidings and avoiding bladder irritants RTC pending MRI results   4. Erectile dysfunction SHIM score is 22, it is improved RTC in 6 months for SHIM score and exam  5. Family history of prostate cancer  Return for MRI report .  Zara Council, PA-C  [facility] [address] [place], [place] [address] [phone]

## 2018-05-29 ENCOUNTER — Other Ambulatory Visit: Payer: Self-pay

## 2018-05-29 ENCOUNTER — Encounter: Payer: Self-pay | Admitting: Urology

## 2018-05-29 ENCOUNTER — Ambulatory Visit (INDEPENDENT_AMBULATORY_CARE_PROVIDER_SITE_OTHER): Payer: BLUE CROSS/BLUE SHIELD | Admitting: Urology

## 2018-05-29 VITALS — BP 132/73 | HR 51 | Ht 70.0 in | Wt 206.5 lb

## 2018-05-29 DIAGNOSIS — N401 Enlarged prostate with lower urinary tract symptoms: Secondary | ICD-10-CM

## 2018-05-29 DIAGNOSIS — N138 Other obstructive and reflux uropathy: Secondary | ICD-10-CM | POA: Diagnosis not present

## 2018-05-29 DIAGNOSIS — N529 Male erectile dysfunction, unspecified: Secondary | ICD-10-CM | POA: Diagnosis not present

## 2018-05-29 DIAGNOSIS — N4231 Prostatic intraepithelial neoplasia: Secondary | ICD-10-CM

## 2018-05-29 DIAGNOSIS — R972 Elevated prostate specific antigen [PSA]: Secondary | ICD-10-CM | POA: Diagnosis not present

## 2018-05-29 MED ORDER — DIAZEPAM 10 MG PO TABS: ORAL_TABLET | ORAL | 0 refills | Status: DC

## 2018-05-31 ENCOUNTER — Telehealth: Payer: Self-pay

## 2018-05-31 NOTE — Telephone Encounter (Signed)
The pt comes in today with his wife for her office visit. Blood pressure checked 108/68, 54 heart rate.

## 2018-06-19 ENCOUNTER — Ambulatory Visit: Payer: BLUE CROSS/BLUE SHIELD | Admitting: Urology

## 2018-06-24 ENCOUNTER — Encounter: Payer: Self-pay | Admitting: Family Medicine

## 2018-06-24 ENCOUNTER — Ambulatory Visit: Payer: BLUE CROSS/BLUE SHIELD | Admitting: Family Medicine

## 2018-06-24 ENCOUNTER — Other Ambulatory Visit: Payer: Self-pay | Admitting: Family Medicine

## 2018-06-24 VITALS — BP 136/74 | HR 46 | Temp 98.2°F | Resp 16 | Ht 70.0 in | Wt 211.6 lb

## 2018-06-24 DIAGNOSIS — R030 Elevated blood-pressure reading, without diagnosis of hypertension: Secondary | ICD-10-CM | POA: Diagnosis not present

## 2018-06-24 DIAGNOSIS — R7309 Other abnormal glucose: Secondary | ICD-10-CM

## 2018-06-24 DIAGNOSIS — F5101 Primary insomnia: Secondary | ICD-10-CM

## 2018-06-24 DIAGNOSIS — E781 Pure hyperglyceridemia: Secondary | ICD-10-CM

## 2018-06-24 DIAGNOSIS — E669 Obesity, unspecified: Secondary | ICD-10-CM

## 2018-06-24 DIAGNOSIS — Z0000 Encounter for general adult medical examination without abnormal findings: Secondary | ICD-10-CM

## 2018-06-24 LAB — POCT GLYCOSYLATED HEMOGLOBIN (HGB A1C): Hemoglobin A1C: 5.7 % — AB (ref 4.0–5.6)

## 2018-06-24 NOTE — Assessment & Plan Note (Signed)
Improved BP overall. Seems better controlled with lifestyle changes and wt loss - Home BP readings normal range, reviewed No known complications     Plan:  1. Discussed Pre-HTN vs HTN and may need medicines, lifestyle management 2. Encourage improved lifestyle - low sodium diet, may add regular exercise in addition to regular work 3. Continue monitor BP outside office, bring readings to next visit, if persistently >140/90 or new symptoms notify office sooner 4. Follow-up 3 months yearly visit and labs

## 2018-06-24 NOTE — Assessment & Plan Note (Addendum)
Significant improved A1c now down to 5.7, from prior 6.3, with lifestyle changes only Now without hyperglycemia. No Hypoglycemia Concern with obesity BMI 30 but some weight loss, HLD  Plan:  1. No medication at this time. Remain off - reviewed metformin for future possibly 2. Encourage improved lifestyle - low carb, low sugar diet, reduce portion size, emphasized importance of diet and may add exercise, did not adhere to changes last time 3. Follow-up 3 months annual phys and lab A1c - if still well controlled likely q 6 month f/u

## 2018-06-24 NOTE — Progress Notes (Signed)
Subjective:    Patient ID: Walter Hebert, male    DOB: June 12, 1951, 67 y.o.   MRN: [id number]  FOUNTAIN DERUSHA is a 67 y.o. male presenting on 06/24/2018 for Pre-Diabetes and Pre-Hypertension   HPI  Elevated A1c / OBESITY BMI >30 Last visit 03/2018, he had further increased A1c up to 6.3 from prior 5.6 Today reports doing well on improved lifestyle Lifestyle - Weight down about 6 lbs in 3 months - Diet: Reduced starches / carbs and portions. Drinks water mostly some soda on occasion less often - Exercise:None regular. He is active most days laying flooring, lifting and physical work. Limited time for exercise Denies hypoglycemia, polyuria, visual changes, numbness or tingling.  Elevated BP without HTN / Pre-Hypertension Home BP readings avg 120-135 / 68-74 Current Meds - None, never on - Not eating much salt added, avoiding high sodium foods since last visit - Drinks occasionally has soda, otherwise not much caffeine - Admits some family stress at times Denies CP, dyspnea, HA, edema, dizziness / lightheadedness   Depression screen [facility] 2/9 06/24/2018 03/22/2018 02/19/2018  Decreased Interest 0 0 0  Down, Depressed, Hopeless 0 0 0  PHQ - 2 Score 0 0 0  Altered sleeping - - 0  Tired, decreased energy - - 0  Change in appetite - - 0  Feeling bad or failure about yourself  - - 0  Trouble concentrating - - 0  Moving slowly or fidgety/restless - - 0  Suicidal thoughts - - 0  PHQ-9 Score - - 0  Difficult doing work/chores - Not difficult at all Not difficult at all    Social History   Tobacco Use  . Smoking status: Never Smoker  . Smokeless tobacco: Never Used  Substance Use Topics  . Alcohol use: No    Comment: Quit- 1990  . Drug use: No    Review of Systems Per HPI unless specifically indicated above     Objective:    BP 136/74   Pulse (!) 46   Temp 98.2 F (36.8 C) (Other (Comment))   Resp 16   Ht 5\' 10"  (1.778 m)   Wt 211 lb 9.6 oz (96 kg)   BMI 30.36 kg/m   Wt  Readings from Last 3 Encounters:  06/24/18 211 lb 9.6 oz (96 kg)  05/29/18 206 lb 8 oz (93.7 kg)  04/01/18 217 lb (98.4 kg)    Physical Exam  Constitutional: He is oriented to person, place, and time. He appears well-developed and well-nourished. No distress.  Well-appearing, comfortable, cooperative  HENT:  Head: Normocephalic and atraumatic.  Mouth/Throat: Oropharynx is clear and moist.  Eyes: Conjunctivae are normal. Right eye exhibits no discharge. Left eye exhibits no discharge.  Neck: Normal range of motion. Neck supple. No thyromegaly present.  Cardiovascular: Normal rate, regular rhythm, normal heart sounds and intact distal pulses.  No murmur heard. Pulmonary/Chest: Effort normal and breath sounds normal. No respiratory distress. He has no wheezes. He has no rales.  Musculoskeletal: Normal range of motion. He exhibits no edema.  Lymphadenopathy:    He has no cervical adenopathy.  Neurological: He is alert and oriented to person, place, and time.  Skin: Skin is warm and dry. No rash noted. He is not diaphoretic. No erythema.  Psychiatric: He has a normal mood and affect. His behavior is normal.  Well groomed, good eye contact, normal speech and thoughts  Nursing note and vitals reviewed.    Recent Labs    09/18/17 0802  03/22/18 0859 06/24/18 0941  HGBA1C 5.6 6.3* 5.7*    Results for orders placed or performed in visit on 06/24/18  POCT HgB A1C  Result Value Ref Range   Hemoglobin A1C 5.7 (A) 4.0 - 5.6 %   HbA1c POC (<> result, manual entry)  4.0 - 5.6 %   HbA1c, POC (prediabetic range)  5.7 - 6.4 %   HbA1c, POC (controlled diabetic range)  0.0 - 7.0 %      Assessment & Plan:   Problem List Items Addressed This Visit    Elevated BP without diagnosis of hypertension    Improved BP overall. Seems better controlled with lifestyle changes and wt loss - Home BP readings normal range, reviewed No known complications     Plan:  1. Discussed Pre-HTN vs HTN and may  need medicines, lifestyle management 2. Encourage improved lifestyle - low sodium diet, may add regular exercise in addition to regular work 3. Continue monitor BP outside office, bring readings to next visit, if persistently >140/90 or new symptoms notify office sooner 4. Follow-up 3 months yearly visit and labs      Elevated hemoglobin A1c - Primary    Significant improved A1c now down to 5.7, from prior 6.3, with lifestyle changes only Now without hyperglycemia. No Hypoglycemia Concern with obesity BMI 30 but some weight loss, HLD  Plan:  1. No medication at this time. Remain off - reviewed metformin for future possibly 2. Encourage improved lifestyle - low carb, low sugar diet, reduce portion size, emphasized importance of diet and may add exercise, did not adhere to changes last time 3. Follow-up 3 months annual phys and lab A1c - if still well controlled likely q 6 month f/u      Relevant Orders   POCT HgB A1C (Completed)      No orders of the defined types were placed in this encounter.   Follow up plan: Return in about 3 months (around 09/24/2018) for Annual Physical.  Future labs ordered for 09/23/18  Nobie Putnam, [facility] 06/24/2018, 9:53 AM

## 2018-06-24 NOTE — Patient Instructions (Addendum)
Thank you for coming to the office today.  A1c 5.7 - much improved from last time 6.3  Keep up the good work overall with low carb low sugar diet  Try to stay well hydrated overall  BP is good as well, no medicine or change  DUE for FASTING BLOOD WORK (no food or drink after midnight before the lab appointment, only water or coffee without cream/sugar on the morning of)  SCHEDULE "Lab Only" visit in the morning at the clinic for lab draw in 3 MONTHS   - Make sure Lab Only appointment is at about 1 week before your next appointment, so that results will be available  For Lab Results, once available within 2-3 days of blood draw, you can can log in to MyChart online to view your results and a brief explanation. Also, we can discuss results at next follow-up visit.   Please schedule a Follow-up Appointment to: Return in about 3 months (around 09/24/2018) for Annual Physical.  If you have any other questions or concerns, please feel free to call the office or send a message through [facility]. You may also schedule an earlier appointment if necessary.  Additionally, you may be receiving a survey about your experience at our office within a few days to 1 week by e-mail or mail. We value your feedback.  Nobie Putnam, DO [place]

## 2018-06-25 ENCOUNTER — Other Ambulatory Visit: Payer: Self-pay

## 2018-07-01 ENCOUNTER — Other Ambulatory Visit: Payer: Self-pay | Admitting: Urology

## 2018-07-01 DIAGNOSIS — R972 Elevated prostate specific antigen [PSA]: Secondary | ICD-10-CM

## 2018-07-08 ENCOUNTER — Ambulatory Visit: Payer: BLUE CROSS/BLUE SHIELD | Admitting: Urology

## 2018-07-11 ENCOUNTER — Ambulatory Visit
Admission: RE | Admit: 2018-07-11 | Discharge: 2018-07-11 | Disposition: A | Discharge status: Home / Self Care | Payer: BLUE CROSS/BLUE SHIELD | Source: Ambulatory Visit | Attending: Urology | Admitting: Urology

## 2018-07-11 DIAGNOSIS — R972 Elevated prostate specific antigen [PSA]: Secondary | ICD-10-CM

## 2018-07-11 MED ORDER — GADOBENATE DIMEGLUMINE 529 MG/ML IV SOLN
20.0000 mL | Freq: Once | INTRAVENOUS | Status: AC | PRN
  Administered 2018-07-11: 20 mL via INTRAVENOUS

## 2018-07-13 NOTE — Progress Notes (Signed)
11:27 AM   Walter Hebert 23-Aug-1951 [id number]  Referring provider: Olin Hauser, DO [address] [place], [place] [address]  Chief Complaint  Patient presents with  . 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  MRI of prostate on 07/11/2018 noted no findings of macroscopic or high-grade prostate carcinoma.  Moderate benign prostatic hyperplasia, resulting in compression of the peripheral zone.  Bladder wall thickening, suggesting a component of outlet obstruction.  Elevated PSA PSA Trend   8.3 in 12/2013 - bx positive for HGPIN  5.2 in 10/2014  6.3 in 06/2015  4.5 in 09/2015  4.5 in 11/2015  4.5 in 10/2017 - started finasteride  3.5 in 04/2016  4.2 in 10/2016 - stopped finasteride   4.1 in 04/2017  4.3 in 10/2017  6.2 in 04/2018   MRI negative for high grade cancer   Restart finasteride 5 mg on 07/15/2018  HGPIN Patient underwent prostate biopsy on 04/27/2014 for a PSA of 8.3 ng/mL and was found to have a 39 gram prostate and two cores positive for HGPIN.   BPH WITH LUTS  (prostate and/or bladder) IPSS score: 1/0    Previous score: 3/1     Major complaint(s):  Nocturia x 1 x many years. Denies any dysuria, hematuria or suprapubic pain.   Denies any recent fevers, chills, nausea or vomiting.  His brother was diagnosed with fatal prostate cancer.    IPSS    Row Name 05/29/18 0800         International Prostate Symptom Score   How often have you had to urinate less than every two hours?  Not at All     How often have you found you stopped and started again several times when you urinated?  Not at All     How often have you found it difficult to postpone urination?  Not at All     How often have you had a weak urinary stream?  Not at All     How often have you had to strain to start urination?  Not at All      How many times did you typically get up at night to urinate?  1 Time     Total IPSS Score  1       Quality of Life due to urinary symptoms   If you were to spend the rest of your life with your urinary condition just the way it is now how would you feel about that?  Delighted        Score:  1-7 Mild 8-19 Moderate 20-35 Severe   Erectile dysfunction His SHIM score is 22, which is no ED.   His previous SHIM score was 21.  He has been having difficulty with erections for many years.   His major complaint is in getting an erection.  His libido is preserved.   His risk factors for ED are age and BPH.  He denies any painful erections or curvatures with his erections.   He is still having an occasional spontaneous erections.  He has tried PDE5i in the past with success.   SHIM    Row Name 05/29/18 0840         SHIM: Over the last 6 months:   How do  you rate your confidence that you could get and keep an erection?  Moderate     When you had erections with sexual stimulation, how often were your erections hard enough for penetration (entering your partner)?  Most Times (much more than half the time)     During sexual intercourse, how often were you able to maintain your erection after you had penetrated (entered) your partner?  Almost Always or Always     During sexual intercourse, how difficult was it to maintain your erection to completion of intercourse?  Not Difficult     When you attempted sexual intercourse, how often was it satisfactory for you?  Almost Always or Always       SHIM Total Score   SHIM  22        Score: 1-7 Severe ED 8-11 Moderate ED 12-16 Mild-Moderate ED 17-21 Mild ED 22-25 No ED    PMH: Past Medical History:  Diagnosis Date  . Elevated PSA   . Enlarged prostate   . 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 Night sweats   . Synovial cyst     Surgical History: Past Surgical History:  Procedure  Laterality Date  . COLONOSCOPY N/A 05/18/2015   Procedure: COLONOSCOPY;  Surgeon: Lucilla Lame, MD;  Location: [facility] ENDOSCOPY;  Service: Endoscopy;  Laterality: N/A;    Home Medications:  Allergies as of 07/15/2018      Reactions   Chloroquine Itching   Hydroxychloroquine Sulfate Hives      Medication List        Accurate as of 07/15/18 11:27 AM. Always use your most recent med list.          AVANAFIL PO Take 1 tablet by mouth as needed.   finasteride 5 MG tablet Commonly known as:  PROSCAR Take 1 tablet (5 mg total) by mouth daily.   multivitamin capsule Take 1 capsule by mouth daily. Reported on 12/24/2015   VITAMIN C PO Take 1 capsule daily by mouth.   VITAMIN E PO Take by mouth.       Allergies:  Allergies  Allergen Reactions  . Chloroquine Itching  . Hydroxychloroquine Sulfate Hives    Family History: Family History  Problem Relation Age of Onset  . Hyperlipidemia Unknown   . Prostate cancer Brother        DECEASED  . Healthy Father   . Healthy Mother   . Kidney disease Neg Hx     Social History:  reports that he has never smoked. He has never used smokeless tobacco. He reports that he does not drink alcohol or use drugs.  ROS: UROLOGY Frequent Urination?: No Hard to postpone urination?: No Burning/pain with urination?: No Get up at night to urinate?: No Leakage of urine?: No Urine stream starts and stops?: No Trouble starting stream?: No Do you have to strain to urinate?: No Blood in urine?: No Urinary tract infection?: No Sexually transmitted disease?: No Injury to kidneys or bladder?: No Painful intercourse?: No Weak stream?: No Erection problems?: No Penile pain?: No Gastrointestinal Nausea?: No Vomiting?: No Indigestion/heartburn?: No Diarrhea?: No Constipation?: No Constitutional Fever: No Night sweats?: No Weight loss?: No Fatigue?: No Skin Skin rash/lesions?: No Itching?: No Eyes Blurred vision?: No Double vision?:  No Ears/Nose/Throat Sore throat?: No Sinus problems?: No Hematologic/Lymphatic Swollen glands?: No Easy bruising?: No Cardiovascular Leg swelling?: No Chest pain?: No Respiratory Cough?: No Shortness of breath?: No Endocrine Excessive thirst?:  No Musculoskeletal Back pain?: No Joint pain?: No Neurological Headaches?: No Dizziness?: No Psychologic Depression?: No Anxiety?: No  Physical Exam: BP (!) 148/71   Pulse 72   Ht 5\' 10"  (1.778 m)   Wt 206 lb (93.4 kg)   BMI 29.56 kg/m   Constitutional: Well nourished. Alert and oriented, No acute distress. HEENT: [place] AT, moist mucus membranes. Trachea midline, no masses. Cardiovascular: No clubbing, cyanosis, or edema. Respiratory: Normal respiratory effort, no increased work of breathing. Skin: No rashes, bruises or suspicious lesions. Lymph: No cervical or inguinal adenopathy. Neurologic: Grossly intact, no focal deficits, moving all 4 extremities. Psychiatric: Normal mood and affect.   Laboratory Data: See HPI  I have reviewed the labs.  Pertinent Imaging CLINICAL DATA:  Elevated PSA.  No primary cancer history.  EXAM: MR PROSTATE WITHOUT AND WITH CONTRAST  TECHNIQUE: Multiplanar multisequence MRI images were obtained of the pelvis centered about the prostate. Pre and post contrast images were obtained.  CONTRAST:  52mL MULTIHANCE GADOBENATE DIMEGLUMINE 529 MG/ML IV SOLN  Creatinine was obtained on site at [facility] at [address]  Results: Creatinine 1.1 mg/dL.  COMPARISON:  None.  FINDINGS: Prostate: Demonstrates moderate central gland enlargement and heterogeneity, consistent with benign prostatic hyperplasia. Central gland enlargement causes mild compression of the peripheral zone. No dominant central gland nodule. No peripheral zone areas of masslike T2 hypointensity, restricted diffusion, or early post-contrast enhancement.  Volume: 5.3 x 4.3 x 5.8 cm (volume = 69  cm^3)  Transcapsular spread:  Absent  Seminal vesicle involvement: Absent  Neurovascular bundle involvement: Absent  Pelvic adenopathy: Absent  Bone metastasis: Absent  Other findings: No significant free fluid. Small bilateral hydroceles are suboptimally evaluated. The bladder wall is mildly thickened, likely accentuated by underdistention.  IMPRESSION: 1. No findings of macroscopic or high-grade prostate carcinoma. Moderate benign prostatic hyperplasia, resulting in compression of the peripheral zone. 2. Bladder wall thickening, suggesting a component of outlet obstruction.   Electronically Signed   By: Abigail Miyamoto M.D.   On: 07/11/2018 13:37  I have independently reviewed the films  Assessment & Plan:    1. Elevated PSA Explained MRI findings to the patient.  Explained that MRI may still miss some prostate cancers as there is no tissue for diagnosis.  It is still possible to have a low grade tumor at this time.  We could continue to monitor with prostate exams and PSA's every six months with the understanding that there could be a delay in the diagnosis of a prostate cancer, restart finasteride to see how the PSA responds as it should decrease 50% on the medication if no cancer is present or pursue a biopsy.   He would like to restart the finasteride 5 mg daily.  RTC in 3 months for PSA only.    2. 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  PSA at time of biopsy was 8.3 ng/mL MRI in 06/2018 negative for findings of macroscopic/high-grade prostate carcinoma See discussion above  3. BPH with LUTS I PSS score is 1/0, it is improved Continue conservative management, timed voidings and avoiding bladder irritants RTC pending MRI results   4. Erectile dysfunction SHIM score is 22, it is improved RTC in 6 months for SHIM score and exam  5. Family history of prostate cancer Brother with fatal prostate cancer  Return in about 3 months (around 10/15/2018)  for PSA only .  Zara Council, [facility] [address]  [place], [place] [address] [phone]

## 2018-07-15 ENCOUNTER — Encounter: Payer: Self-pay | Admitting: Urology

## 2018-07-15 ENCOUNTER — Ambulatory Visit (INDEPENDENT_AMBULATORY_CARE_PROVIDER_SITE_OTHER): Payer: BLUE CROSS/BLUE SHIELD | Admitting: Urology

## 2018-07-15 VITALS — BP 148/71 | HR 72 | Ht 70.0 in | Wt 206.0 lb

## 2018-07-15 DIAGNOSIS — R972 Elevated prostate specific antigen [PSA]: Secondary | ICD-10-CM

## 2018-07-15 DIAGNOSIS — N4231 Prostatic intraepithelial neoplasia: Secondary | ICD-10-CM

## 2018-07-15 MED ORDER — FINASTERIDE 5 MG PO TABS: 5.0000 mg | ORAL_TABLET | Freq: Every day | ORAL | 3 refills | Status: DC

## 2018-09-23 ENCOUNTER — Other Ambulatory Visit: Payer: BLUE CROSS/BLUE SHIELD

## 2018-09-23 DIAGNOSIS — R030 Elevated blood-pressure reading, without diagnosis of hypertension: Secondary | ICD-10-CM

## 2018-09-23 DIAGNOSIS — E781 Pure hyperglyceridemia: Secondary | ICD-10-CM

## 2018-09-23 DIAGNOSIS — Z0000 Encounter for general adult medical examination without abnormal findings: Secondary | ICD-10-CM

## 2018-09-23 DIAGNOSIS — R7309 Other abnormal glucose: Secondary | ICD-10-CM

## 2018-09-24 LAB — CBC WITH DIFFERENTIAL/PLATELET
Basophils Absolute: 29 cells/uL (ref 0–200)
Basophils Relative: 0.9 %
EOS ABS: 80 {cells}/uL (ref 15–500)
Eosinophils Relative: 2.5 %
HEMATOCRIT: 47.1 % (ref 38.5–50.0)
HEMOGLOBIN: 14.9 g/dL (ref 13.2–17.1)
LYMPHS ABS: 1802 {cells}/uL (ref 850–3900)
MCH: 24 pg — ABNORMAL LOW (ref 27.0–33.0)
MCHC: 31.6 g/dL — ABNORMAL LOW (ref 32.0–36.0)
MCV: 75.7 fL — AB (ref 80.0–100.0)
MPV: 11.6 fL (ref 7.5–12.5)
Monocytes Relative: 10.3 %
NEUTROS ABS: 960 {cells}/uL — AB (ref 1500–7800)
Neutrophils Relative %: 30 %
Platelets: 160 10*3/uL (ref 140–400)
RBC: 6.22 10*6/uL — AB (ref 4.20–5.80)
RDW: 14.2 % (ref 11.0–15.0)
TOTAL LYMPHOCYTE: 56.3 %
WBC: 3.2 10*3/uL — ABNORMAL LOW (ref 3.8–10.8)
WBCMIX: 330 {cells}/uL (ref 200–950)

## 2018-09-24 LAB — COMPLETE METABOLIC PANEL WITH GFR
AG Ratio: 1.3 (calc) (ref 1.0–2.5)
ALT: 19 U/L (ref 9–46)
AST: 23 U/L (ref 10–35)
Albumin: 4 g/dL (ref 3.6–5.1)
Alkaline phosphatase (APISO): 59 U/L (ref 40–115)
BUN: 18 mg/dL (ref 7–25)
CALCIUM: 9.3 mg/dL (ref 8.6–10.3)
CO2: 30 mmol/L (ref 20–32)
Chloride: 102 mmol/L (ref 98–110)
Creat: 1.03 mg/dL (ref 0.70–1.25)
GFR, EST AFRICAN AMERICAN: 88 mL/min/{1.73_m2} (ref >=60)
GFR, EST NON AFRICAN AMERICAN: 76 mL/min/{1.73_m2} (ref >=60)
GLUCOSE: 78 mg/dL (ref 65–99)
Globulin: 3 g/dL (calc) (ref 1.9–3.7)
Potassium: 4.2 mmol/L (ref 3.5–5.3)
Sodium: 136 mmol/L (ref 135–146)
TOTAL PROTEIN: 7 g/dL (ref 6.1–8.1)
Total Bilirubin: 0.5 mg/dL (ref 0.2–1.2)

## 2018-09-24 LAB — HEMOGLOBIN A1C
Hgb A1c MFr Bld: 5.8 % of total Hgb — ABNORMAL HIGH (ref <5.7)
MEAN PLASMA GLUCOSE: 120 (calc)
eAG (mmol/L): 6.6 (calc)

## 2018-09-24 LAB — LIPID PANEL
Cholesterol: 218 mg/dL — ABNORMAL HIGH (ref <200)
HDL: 62 mg/dL (ref >40)
LDL Cholesterol (Calc): 136 mg/dL (calc) — ABNORMAL HIGH
NON-HDL CHOLESTEROL (CALC): 156 mg/dL — AB (ref <130)
Total CHOL/HDL Ratio: 3.5 (calc) (ref <5.0)
Triglycerides: 97 mg/dL (ref <150)

## 2018-09-25 ENCOUNTER — Ambulatory Visit (INDEPENDENT_AMBULATORY_CARE_PROVIDER_SITE_OTHER): Payer: BLUE CROSS/BLUE SHIELD | Admitting: Family Medicine

## 2018-09-25 ENCOUNTER — Encounter: Payer: Self-pay | Admitting: Family Medicine

## 2018-09-25 VITALS — BP 138/80 | HR 47 | Temp 97.8°F | Resp 16 | Ht 70.0 in | Wt 206.0 lb

## 2018-09-25 DIAGNOSIS — Z0000 Encounter for general adult medical examination without abnormal findings: Secondary | ICD-10-CM | POA: Diagnosis not present

## 2018-09-25 DIAGNOSIS — N401 Enlarged prostate with lower urinary tract symptoms: Secondary | ICD-10-CM

## 2018-09-25 DIAGNOSIS — Z23 Encounter for immunization: Secondary | ICD-10-CM

## 2018-09-25 DIAGNOSIS — E781 Pure hyperglyceridemia: Secondary | ICD-10-CM | POA: Diagnosis not present

## 2018-09-25 DIAGNOSIS — J069 Acute upper respiratory infection, unspecified: Secondary | ICD-10-CM

## 2018-09-25 DIAGNOSIS — E663 Overweight: Secondary | ICD-10-CM

## 2018-09-25 DIAGNOSIS — R7309 Other abnormal glucose: Secondary | ICD-10-CM | POA: Diagnosis not present

## 2018-09-25 DIAGNOSIS — R030 Elevated blood-pressure reading, without diagnosis of hypertension: Secondary | ICD-10-CM

## 2018-09-25 DIAGNOSIS — N138 Other obstructive and reflux uropathy: Secondary | ICD-10-CM

## 2018-09-25 NOTE — Progress Notes (Signed)
Subjective:    Patient ID: Walter Hebert, male    DOB: Dec 25, 1950, 67 y.o.   MRN: [id number]  Walter Hebert is a 67 y.o. male presenting on 09/25/2018 for Annual Exam   HPI   Here for Annual Physical and Lab Review.  HYPERLIPIDEMIA Last lipid 09/2018, improved LDL 160 to 130 with lifestyle - Not on any cholesterol medication at this time  Elevated A1c /Overweight BMI >29 Prior history of A1c up to 6.3 in past, now has been stable 5.7 to 5.8  Today reports doing well on improved lifestyle Lifestyle - Weight down about 5-6 lbs in 3 months - Diet: Continues healthy diet, limited when traveling for work eating out. Reduced starches / carbs and portions. Drinks water mostly some soda on occasion less often -Exercise:None regular. He is active most days laying flooring, lifting and physical work. Limited time for exercise Denies hypoglycemia  Elevated BP without HTN / Pre-Hypertension Home BP readings avg 120-130s, stable from previous. Today mild elevated. Current Meds - None, never on - Limited sodium and caffeine  Additional complaint - URI symptoms recently, took some [employer] with good results  PMH - Elevated PSA with BPH, prior abnormal prostate biopsy - followed by [facility]  Health Maintenance: -Due for Flu Shot, will receive today  Prostate CA Screening: Followed by [facility], history of elevated PSA.  Due for initial pneumonia vaccine at age 56 - will receive Prevnar-13 today  Depression screen [facility] 2/9 09/25/2018 06/24/2018 03/22/2018  Decreased Interest 0 0 0  Down, Depressed, Hopeless 0 0 0  PHQ - 2 Score 0 0 0  Altered sleeping - - -  Tired, decreased energy - - -  Change in appetite - - -  Feeling bad or failure about yourself  - - -  Trouble concentrating - - -  Moving slowly or fidgety/restless - - -  Suicidal thoughts - - -  PHQ-9 Score - - -  Difficult doing work/chores - - Not difficult at all    Past Medical History:  Diagnosis Date  . Elevated PSA   .  Enlarged prostate   . 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 Night sweats   . Synovial cyst    Past Surgical History:  Procedure Laterality Date  . COLONOSCOPY N/A 05/18/2015   Procedure: COLONOSCOPY;  Surgeon: Lucilla Lame, MD;  Location: [facility] ENDOSCOPY;  Service: Endoscopy;  Laterality: N/A;   Social History   Socioeconomic History  . Marital status: Married    Spouse name: Not on file  . Number of children: Not on file  . Years of education: Not on file  . Highest education level: Not on file  Occupational History  . Not on file  Social Needs  . Financial resource strain: Not on file  . Food insecurity:    Worry: Not on file    Inability: Not on file  . Transportation needs:    Medical: Not on file    Non-medical: Not on file  Tobacco Use  . Smoking status: Never Smoker  . Smokeless tobacco: Never Used  Substance and Sexual Activity  . Alcohol use: No    Comment: Quit- 1990  . Drug use: No  . Sexual activity: Not on file  Lifestyle  . Physical activity:    Days per week: Not on file    Minutes per session: Not on file  . Stress: Not on file  Relationships  . Social connections:    Talks on phone: Not on file    Gets together: Not on file    Attends religious service: Not on file    Active member of club or organization: Not on file    Attends meetings of clubs or organizations: Not on file    Relationship status: Not on file  . Intimate partner violence:    Fear of current or ex partner: Not on file    Emotionally abused: Not on file    Physically abused: Not on file    Forced sexual activity: Not on file  Other Topics Concern  . Not on file  Social History Narrative  . Not on file   Family History  Problem Relation Age of Onset  . Hyperlipidemia Unknown   . Prostate cancer Brother        DECEASED  . Healthy Father   . Healthy Mother   . Kidney disease Neg Hx    Current Outpatient Medications on  File Prior to Visit  Medication Sig  . Ascorbic Acid (VITAMIN C PO) Take 1 capsule daily by mouth.   Marland Kitchen aspirin EC 81 MG tablet Take 81 mg by mouth daily.  . AVANAFIL PO Take 1 tablet by mouth as needed.  . finasteride (PROSCAR) 5 MG tablet Take 1 tablet (5 mg total) by mouth daily.  . Multiple Vitamin (MULTIVITAMIN) capsule Take 1 capsule by mouth daily. Reported on 12/24/2015  . VITAMIN E PO Take by mouth.   No current facility-administered medications on file prior to visit.     Review of Systems  Constitutional: Negative for activity change, appetite change, chills, diaphoresis, fatigue and fever.  HENT: Negative for congestion and hearing loss.   Eyes: Negative for visual disturbance.  Respiratory: Negative for apnea, cough, choking, chest tightness, shortness of breath and wheezing.   Cardiovascular: Negative for chest pain, palpitations and leg swelling.  Gastrointestinal: Negative for abdominal pain, anal bleeding, blood in stool, constipation, diarrhea, nausea and vomiting.  Endocrine: Negative for cold intolerance.  Genitourinary: Negative for difficulty urinating, dysuria, frequency and hematuria.  Musculoskeletal: Negative for arthralgias, back pain and neck pain.  Skin: Negative for rash.  Allergic/Immunologic: Negative for environmental allergies.  Neurological: Negative for dizziness, weakness, light-headedness, numbness and headaches.  Hematological: Negative for adenopathy.  Psychiatric/Behavioral: Negative for behavioral problems, dysphoric mood and sleep disturbance. The patient is not nervous/anxious.    Per HPI unless specifically indicated above     Objective:    BP 138/80 (BP Location: Left Arm, Cuff Size: Normal)   Pulse (!) 47   Temp 97.8 F (36.6 C) (Oral)   Resp 16   Ht 5\' 10"  (1.778 m)   Wt 206 lb (93.4 kg)   BMI 29.56 kg/m   Wt Readings from Last 3 Encounters:  09/25/18 206 lb (93.4 kg)  07/15/18 206 lb (93.4 kg)  06/24/18 211 lb 9.6 oz (96 kg)      Physical Exam  Constitutional: He is oriented to person, place, and time. He appears well-developed and well-nourished. No distress.  Well-appearing, comfortable, cooperative  HENT:  Head: Normocephalic and atraumatic.  Mouth/Throat: Oropharynx is clear and moist.  Frontal / maxillary sinuses non-tender. Nares patent without purulence or edema. Bilateral TMs obscured by soft cerumen w/o impaction. Oropharynx clear without erythema, exudates, edema or asymmetry.  Eyes: Pupils are equal, round, and reactive to light. Conjunctivae and EOM are normal. Right eye exhibits no discharge. Left eye exhibits no discharge.  Neck: Normal range of motion. Neck supple. No thyromegaly present.  Cardiovascular: Normal rate, regular rhythm, normal heart sounds and intact distal pulses.  No murmur heard. Pulmonary/Chest: Effort normal and breath sounds normal. No respiratory distress. He has no wheezes. He has no rales.  Abdominal: Soft. Bowel sounds are normal. He exhibits no distension and no mass. There is no tenderness.  Musculoskeletal: Normal range of motion. He exhibits no edema or tenderness.  Upper / Lower Extremities: - Normal muscle tone, strength bilateral upper extremities 5/5, lower extremities 5/5  Lymphadenopathy:    He has no cervical adenopathy.  Neurological: He is alert and oriented to person, place, and time.  Distal sensation intact to light touch all extremities  Skin: Skin is warm and dry. No rash noted. He is not diaphoretic. No erythema.  Psychiatric: He has a normal mood and affect. His behavior is normal.  Well groomed, good eye contact, normal speech and thoughts  Nursing note and vitals reviewed.  Results for orders placed or performed in visit on 09/23/18  Lipid panel  Result Value Ref Range   Cholesterol 218 (H) <200 mg/dL   HDL 62 >40 mg/dL   Triglycerides 97 <150 mg/dL   LDL Cholesterol (Calc) 136 (H) mg/dL (calc)   Total CHOL/HDL Ratio 3.5 <5.0 (calc)   Non-HDL  Cholesterol (Calc) 156 (H) <130 mg/dL (calc)  COMPLETE METABOLIC PANEL WITH GFR  Result Value Ref Range   Glucose, Bld 78 65 - 99 mg/dL   BUN 18 7 - 25 mg/dL   Creat 1.03 0.70 - 1.25 mg/dL   GFR, Est Non African American 76 > OR = 60 mL/min/1.62m2   GFR, Est African American 88 > OR = 60 mL/min/1.61m2   BUN/Creatinine Ratio NOT APPLICABLE 6 - 22 (calc)   Sodium 136 135 - 146 mmol/L   Potassium 4.2 3.5 - 5.3 mmol/L   Chloride 102 98 - 110 mmol/L   CO2 30 20 - 32 mmol/L   Calcium 9.3 8.6 - 10.3 mg/dL   Total Protein 7.0 6.1 - 8.1 g/dL   Albumin 4.0 3.6 - 5.1 g/dL   Globulin 3.0 1.9 - 3.7 g/dL (calc)   AG Ratio 1.3 1.0 - 2.5 (calc)   Total Bilirubin 0.5 0.2 - 1.2 mg/dL   Alkaline phosphatase (APISO) 59 40 - 115 U/L   AST 23 10 - 35 U/L   ALT 19 9 - 46 U/L  CBC with Differential/Platelet  Result Value Ref Range   WBC 3.2 (L) 3.8 - 10.8 Thousand/uL   RBC 6.22 (H) 4.20 - 5.80 Million/uL   Hemoglobin 14.9 13.2 - 17.1 g/dL   HCT 47.1 38.5 - 50.0 %   MCV 75.7 (L) 80.0 - 100.0 fL   MCH 24.0 (L) 27.0 - 33.0 pg   MCHC 31.6 (L) 32.0 - 36.0 g/dL   RDW 14.2 11.0 - 15.0 %   Platelets 160 140 - 400 Thousand/uL   MPV 11.6 7.5 - 12.5 fL   Neutro Abs 960 (L) 1,500 - 7,800 cells/uL   Lymphs Abs 1,802 850 - 3,900 cells/uL   WBC mixed population 330 200 - 950 cells/uL   Eosinophils Absolute 80 15 - 500 cells/uL   Basophils Absolute 29 0 - 200 cells/uL   Neutrophils Relative % 30 %   Total Lymphocyte 56.3 %   Monocytes Relative 10.3 %   Eosinophils Relative 2.5 %   Basophils Relative 0.9 %  Hemoglobin A1c  Result Value Ref Range   Hgb A1c MFr  Bld 5.8 (H) <5.7 % of total Hgb   Mean Plasma Glucose 120 (calc)   eAG (mmol/L) 6.6 (calc)      Assessment & Plan:   Problem List Items Addressed This Visit    BPH with obstruction/lower urinary tract symptoms    Followed by [facility] for BPH along with elevated PSA and abnormal prostate biopsy      Elevated BP without diagnosis of hypertension     Mild elevated BP today, improve on re-check Seems better controlled with lifestyle changes and wt loss - Home BP readings normal range, reviewed No known complications     Plan:  1. Discussed Pre-HTN vs HTN and may need medicines, lifestyle management 2. Encourage improved lifestyle - low sodium diet, may add regular exercise in addition to regular work 3. 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 No Hypoglycemia  Plan:  1. No medication at this time. Remain off - reviewed metformin for future possibly 2. Encourage improved lifestyle - low carb, low sugar diet, reduce portion size, emphasized importance of diet and may add exercise, did not adhere to changes last time 3. Follow-up 6 months A1c      Hyperlipidemia    Significantly improved LDL cholesterol 160 to 130 on lifestyle Last lipid panel 09/2018 Calculated ASCVD 10 yr risk score 8.4%  Plan: 1. Discussion on ASCVD risk reduction strategies - may be candidate for statin, hold on this for now, re-consider 2. May take ASA 81mg  for primary ASCVD risk reduction 3. Encourage improved lifestyle - low carb/cholesterol, reduce portion size,recommend exercise 4. Follow-up yearly lipid      Relevant Medications   aspirin EC 81 MG tablet   Overweight (BMI 25.0-29.9)    Improved wt loss, now BMI < 30, overweight Continue improved lifestyle / diet / activity       Other Visit Diagnoses    Annual physical exam    -  Primary   Needs flu shot       Relevant Orders   Flu vaccine HIGH DOSE PF (Completed)   Need for vaccination with 13-polyvalent pneumococcal conjugate vaccine       Relevant Orders   Pneumococcal conjugate vaccine 13-valent IM (Completed)   Upper respiratory tract infection, unspecified type           Annual Updated Health Maintenance information - High dose Flu and Prevnar 13 today Reviewed recent lab results with patient Encouraged improvement to lifestyle with diet and exercise - Goal of weight loss   No orders of the defined types were placed in this encounter.  Follow up plan: Return in about 6 months (around 03/27/2019) for 6 mo PreDM A1c, BP Check.  Nobie Putnam, [facility] 09/25/2018, 11:58 AM

## 2018-09-25 NOTE — Assessment & Plan Note (Signed)
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 No Hypoglycemia  Plan:  1. No medication at this time. Remain off - reviewed metformin for future possibly 2. Encourage improved lifestyle - low carb, low sugar diet, reduce portion size, emphasized importance of diet and may add exercise, did not adhere to changes last time 3. Follow-up 6 months A1c

## 2018-09-25 NOTE — Assessment & Plan Note (Signed)
Significantly improved LDL cholesterol 160 to 130 on lifestyle Last lipid panel 09/2018 Calculated ASCVD 10 yr risk score 8.4%  Plan: 1. Discussion on ASCVD risk reduction strategies - may be candidate for statin, hold on this for now, re-consider 2. May take ASA 81mg  for primary ASCVD risk reduction 3. Encourage improved lifestyle - low carb/cholesterol, reduce portion size,recommend exercise 4. Follow-up yearly lipid

## 2018-09-25 NOTE — Assessment & Plan Note (Signed)
Mild elevated BP today, improve on re-check Seems better controlled with lifestyle changes and wt loss - Home BP readings normal range, reviewed No known complications     Plan:  1. Discussed Pre-HTN vs HTN and may need medicines, lifestyle management 2. Encourage improved lifestyle - low sodium diet, may add regular exercise in addition to regular work 3. Continue monitor BP outside office, bring readings to next visit, if persistently >140/90 or new symptoms notify office sooner  F/u q 6 mo

## 2018-09-25 NOTE — Patient Instructions (Addendum)
Thank you for coming to the office today.  Flu shot today  Pneumonia vaccine today - one more next year then done forever   1. Chemistry - Normal results, including electrolytes, kidney and liver function. Normal fasting blood sugar   2. Hemoglobin A1c (Pre-Diabetes) A1c 5.8, stable from last 5.7, overall still well controlled  3. Cholesterol - Significantly improved now reduced LDL from 163 down to 136. Otherwise normal.  4. CBC Blood Counts - Normal, no anemia, other abnormality  Keep up the great work!  Have a safe trip to [place].  Please schedule a Follow-up Appointment to: Return in about 6 months (around 03/27/2019) for 6 mo PreDM A1c, BP Check.  If you have any other questions or concerns, please feel free to call the office or send a message through [facility]. You may also schedule an earlier appointment if necessary.  Additionally, you may be receiving a survey about your experience at our office within a few days to 1 week by e-mail or mail. We value your feedback.  Nobie Putnam, DO [place]

## 2018-09-25 NOTE — Assessment & Plan Note (Signed)
Improved wt loss, now BMI < 30, overweight Continue improved lifestyle / diet / activity

## 2018-09-25 NOTE — Assessment & Plan Note (Signed)
Followed by [facility] for BPH along with elevated PSA and abnormal prostate biopsy

## 2018-10-08 ENCOUNTER — Other Ambulatory Visit: Payer: BLUE CROSS/BLUE SHIELD

## 2018-10-15 ENCOUNTER — Other Ambulatory Visit: Payer: BLUE CROSS/BLUE SHIELD

## 2018-11-11 ENCOUNTER — Other Ambulatory Visit: Payer: BLUE CROSS/BLUE SHIELD

## 2018-11-11 DIAGNOSIS — R972 Elevated prostate specific antigen [PSA]: Secondary | ICD-10-CM

## 2018-11-12 LAB — PSA: Prostate Specific Ag, Serum: 4.6 ng/mL — ABNORMAL HIGH (ref 0.0–4.0)

## 2018-11-13 ENCOUNTER — Telehealth: Payer: Self-pay

## 2018-11-13 DIAGNOSIS — Z87898 Personal history of other specified conditions: Secondary | ICD-10-CM

## 2018-11-13 NOTE — Telephone Encounter (Signed)
Attempted to reach pt no answer, left vm to call back

## 2018-11-13 NOTE — Telephone Encounter (Signed)
-----   Message from Nori Riis, PA-C sent at 11/12/2018  5:00 PM EST ----- Please let Mr. Kumari know that his PSA has reduced in value.  I would like him to continue on the finasteride and have an office visit with me in three months with PSA prior.

## 2018-11-19 NOTE — Telephone Encounter (Signed)
Called pt informed him of the information below. Pt gave verbal understanding. PSA order placed. Appts scheduled.

## 2019-02-12 ENCOUNTER — Other Ambulatory Visit: Payer: BLUE CROSS/BLUE SHIELD

## 2019-02-12 DIAGNOSIS — Z87898 Personal history of other specified conditions: Secondary | ICD-10-CM

## 2019-02-13 LAB — PSA: Prostate Specific Ag, Serum: 4.1 ng/mL — ABNORMAL HIGH (ref 0.0–4.0)

## 2019-02-18 ENCOUNTER — Encounter: Payer: Self-pay | Admitting: Urology

## 2019-02-18 ENCOUNTER — Ambulatory Visit: Payer: BLUE CROSS/BLUE SHIELD | Admitting: Urology

## 2019-02-18 VITALS — BP 151/81 | HR 59 | Ht 70.0 in | Wt 209.0 lb

## 2019-02-18 DIAGNOSIS — N529 Male erectile dysfunction, unspecified: Secondary | ICD-10-CM

## 2019-02-18 DIAGNOSIS — R972 Elevated prostate specific antigen [PSA]: Secondary | ICD-10-CM | POA: Diagnosis not present

## 2019-02-18 DIAGNOSIS — N4231 Prostatic intraepithelial neoplasia: Secondary | ICD-10-CM

## 2019-02-18 DIAGNOSIS — Z8042 Family history of malignant neoplasm of prostate: Secondary | ICD-10-CM

## 2019-02-18 DIAGNOSIS — N401 Enlarged prostate with lower urinary tract symptoms: Secondary | ICD-10-CM

## 2019-02-18 DIAGNOSIS — N138 Other obstructive and reflux uropathy: Secondary | ICD-10-CM

## 2019-02-18 LAB — BLADDER SCAN AMB NON-IMAGING

## 2019-02-18 NOTE — Progress Notes (Signed)
02/18/2019 9:40 AM   Walter Hebert 1951-10-31 [id number]  Referring provider: Olin Hauser, DO [address] [place], [place] [address]  Chief Complaint  Patient presents with  . Elevated PSA    HPI: Walter Hebert is a 68 y.o. 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  MRI of prostate on 07/11/2018 noted no findings of macroscopic or high-grade prostate carcinoma.  Moderate benign prostatic hyperplasia, resulting in compression of the peripheral zone.  Bladder wall thickening, suggesting a component of outlet obstruction.  Elevated PSA PSA Trend   8.3 in 12/2013 - bx positive for HGPIN  5.2 in 10/2014  6.3 in 06/2015  4.5 in 09/2015  4.5 in 11/2015  4.5 in 10/2017 - started finasteride  3.5 in 04/2016  4.2 in 10/2016 - stopped finasteride   4.1 in 04/2017  4.3 in 10/2017  6.2 in 04/2018   MRI negative for high grade cancer   Restart finasteride 5 mg on 07/15/2018  4.6 in 10/2018  4.1 in 01/2019 - No finasteride - stopped it two months ago  HGPIN Patient underwent prostate biopsy on 04/27/2014 for a PSA of 8.3 ng/mL and was found to have a 39 gram prostate and two cores positive for HGPIN.   BPH WITH LUTS  (prostate and/or bladder) IPSS score: 3/1 PVR: 5 mL  Previous score: 1/0  Major complaint(s): Nocturia x 1 for many years. Denies any dysuria, hematuria or suprapubic pain.   His has stopped taking Finasteride 2 months ago and notes that it had made his ED worse.  Patient drinks water with lemon juice, occasionally soda, and has been adding Prekese tea to his diet.  Denies any recent fevers, chills, nausea or vomiting.  He has a family history of PCa, with his brother having had fatal prostate cancer.  IPSS    Row Name 02/18/19 0800         International Prostate Symptom Score   How often have you had the sensation of not emptying your bladder?  Not at All     How often have  you had to urinate less than every two hours?  Less than 1 in 5 times     How often have you found you stopped and started again several times when you urinated?  Not at All     How often have you found it difficult to postpone urination?  Less than 1 in 5 times     How often have you had a weak urinary stream?  Not at All     How often have you had to strain to start urination?  Not at All     How many times did you typically get up at night to urinate?  1 Time     Total IPSS Score  3       Quality of Life due to urinary symptoms   If you were to spend the rest of your life with your urinary condition just the way it is now how would you feel about that?  Pleased        Score:  1-7 Mild 8-19 Moderate 20-35 Severe  Erectile dysfunction His SHIM score is 15, which is mil-moderate ED.   His previous SHIM score was 22.  He has been having difficulty with erections for many years.  His major complaint is in getting an erection.  His  libido is preserved.  His risk factors for ED are age and BPH.  He denies any painful erections or curvatures with his erections.   He is still having having occasional spontaneous erections.  He has tried PDE5i in the past, with success.  Patient has noted that when he has a fever, his ED symptoms are worse; it happens about once a month.  Patient denies sleep apnea, though he reports he does snore when he sleeps on his back and he prefers to sleep on his side, particularly the left.  Patient has not noticed any relationship between sleeping position and fever.  [name] 02/18/19 0830         SHIM: Over the last 6 months:   How do you rate your confidence that you could get and keep an erection?  Low     When you had erections with sexual stimulation, how often were your erections hard enough for penetration (entering your partner)?  Sometimes (about half the time)     During sexual intercourse, how often were you able to maintain your erection after  you had penetrated (entered) your partner?  Sometimes (about half the time)     During sexual intercourse, how difficult was it to maintain your erection to completion of intercourse?  Slightly Difficult     When you attempted sexual intercourse, how often was it satisfactory for you?  Sometimes (about half the time)       SHIM Total Score   SHIM  15       Score: 1-7 Severe ED 8-11 Moderate ED 12-16 Mild-Moderate ED 17-21 Mild ED 22-25 No ED  PMH: Past Medical History:  Diagnosis Date  . Elevated PSA   . Enlarged prostate   . 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 Night sweats   . Synovial cyst     Surgical History: Past Surgical History:  Procedure Laterality Date  . COLONOSCOPY N/A 05/18/2015   Procedure: COLONOSCOPY;  Surgeon: Lucilla Lame, MD;  Location: [facility];  Service: Endoscopy;  Laterality: N/A;    Home Medications:  Allergies as of 02/18/2019      Reactions   Chloroquine Itching   Hydroxychloroquine Sulfate Hives      Medication List       Accurate as of February 18, 2019  9:40 AM. Always use your most recent med list.        aspirin EC 81 MG tablet Take 81 mg by mouth daily.   AVANAFIL PO Take 1 tablet by mouth as needed.   finasteride 5 MG tablet Commonly known as:  PROSCAR Take 1 tablet (5 mg total) by mouth daily.   multivitamin capsule Take 1 capsule by mouth daily. Reported on 12/24/2015   VITAMIN C PO Take 1 capsule daily by mouth.   VITAMIN E PO Take by mouth.       Allergies:  Allergies  Allergen Reactions  . Chloroquine Itching  . Hydroxychloroquine Sulfate Hives    Family History: Family History  Problem Relation Age of Onset  . Hyperlipidemia Unknown   . Prostate cancer Brother        DECEASED  . Healthy Father   . Healthy Mother   . Kidney disease Neg Hx     Social History:  reports that he has never smoked. He has never used smokeless tobacco. He reports  that he does not drink alcohol or use  drugs.  ROS: UROLOGY Frequent Urination?: No Hard to postpone urination?: Yes Burning/pain with urination?: No Get up at night to urinate?: Yes Leakage of urine?: No Urine stream starts and stops?: No Trouble starting stream?: No Do you have to strain to urinate?: No Blood in urine?: No Urinary tract infection?: No Sexually transmitted disease?: No Injury to kidneys or bladder?: No Painful intercourse?: No Weak stream?: No Erection problems?: No Penile pain?: No Gastrointestinal Nausea?: No Vomiting?: No Indigestion/heartburn?: No Diarrhea?: No Constipation?: No Constitutional Fever: No Night sweats?: Yes Weight loss?: No Fatigue?: No Skin Skin rash/lesions?: No Itching?: No Eyes Blurred vision?: No Double vision?: No Ears/Nose/Throat Sore throat?: No Sinus problems?: No Hematologic/Lymphatic Swollen glands?: No Easy bruising?: No Cardiovascular Leg swelling?: No Chest pain?: No Respiratory Cough?: No Shortness of breath?: No Endocrine Excessive thirst?: No Musculoskeletal Back pain?: No Joint pain?: No Neurological Headaches?: No Dizziness?: No Psychologic Depression?: No Anxiety?: No  Physical Exam: BP (!) 151/81 (BP Location: Left Arm, Patient Position: Sitting)   Pulse (!) 59   Ht 5\' 10"  (1.778 m)   Wt 209 lb (94.8 kg)   BMI 29.99 kg/m   Constitutional:  Well nourished. Alert and oriented, No acute distress. Cardiovascular: No clubbing, cyanosis, or edema. Respiratory: Normal respiratory effort, no increased work of breathing. GU: No CVA tenderness.  No bladder fullness or masses.  Patient with circumcised phallus.  Urethral meatus is patent.  No penile discharge. No penile lesions or rashes. Scrotum without lesions, cysts, rashes and/or edema.  Testicles are located scrotally bilaterally. No masses are appreciated in the testicles. Left and right epididymis are normal. Rectal: Patient with normal  sphincter tone.  Anus and perineum without scarring or rashes.  No rectal masses are appreciated. Prostate is approximately 60 grams, apex and midportion palpated, no nodules are appreciated.  Seminal vesicles could not be palpated. Skin: No rashes, bruises or suspicious lesions. Neurologic: Grossly intact, no focal deficits, moving all 4 extremities. Psychiatric: Normal mood and affect.  Laboratory Data: Lab Results  Component Value Date   WBC 3.2 (L) 09/23/2018   HGB 14.9 09/23/2018   HCT 47.1 09/23/2018   MCV 75.7 (L) 09/23/2018   PLT 160 09/23/2018   Lab Results  Component Value Date   CREATININE 1.03 09/23/2018   PSA See HPI  Lab Results  Component Value Date   HGBA1C 5.8 (H) 09/23/2018      Component Value Date/Time   CHOL 218 (H) 09/23/2018 0806   HDL 62 09/23/2018 0806   CHOLHDL 3.5 09/23/2018 0806   LDLCALC 136 (H) 09/23/2018 0806    Lab Results  Component Value Date   AST 23 09/23/2018   Lab Results  Component Value Date   ALT 19 09/23/2018   I have reviewed the labs.  Assessment & Plan:    1. Elevated PSA - Discontinue finasteride, as it made erectile dysfunction worse and PSA has improved regardless - RTC in 6 months for PSA   2. 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  PSA at time of biopsy was 8.3 ng/mL - MRI in 06/2018 negative for findings of macroscopic/high-grade prostate carcinoma   3. BPH with LUTS - I PSS score is 3/1, it is slightly worse - Continue conservative management, timed voidings and avoiding bladder irritants - RTC in 6 months for I PSS, exam and PSA  4. Erectile dysfunction - SHIM score is 15, it is worsening - At this point, patient would prefer to defer pursuing  medication - Patient will follow up with PCP on the possibility of having sleep apnea - RTC in 6 months for SHIM score and exam  5. Family history of prostate cancer - Brother with fatal prostate cancer  Return in about 6 months (around 08/21/2019)  for IPSS, SHIM, PSA and exam.  Zara Council, [facility]  [place] [address] [place],  [address] [phone]  I, Adele Schilder, am acting as a Education administrator for [employer], PA-C.   I have reviewed the above documentation for accuracy and completeness, and I agree with the above.    Zara Council, PA-C

## 2019-03-27 ENCOUNTER — Ambulatory Visit (INDEPENDENT_AMBULATORY_CARE_PROVIDER_SITE_OTHER): Payer: BLUE CROSS/BLUE SHIELD | Admitting: Family Medicine

## 2019-03-27 ENCOUNTER — Encounter: Payer: Self-pay | Admitting: Family Medicine

## 2019-03-27 ENCOUNTER — Other Ambulatory Visit: Payer: Self-pay

## 2019-03-27 ENCOUNTER — Other Ambulatory Visit: Payer: Self-pay | Admitting: Family Medicine

## 2019-03-27 VITALS — Ht 70.0 in | Wt 205.0 lb

## 2019-03-27 DIAGNOSIS — E781 Pure hyperglyceridemia: Secondary | ICD-10-CM

## 2019-03-27 DIAGNOSIS — E663 Overweight: Secondary | ICD-10-CM

## 2019-03-27 DIAGNOSIS — N401 Enlarged prostate with lower urinary tract symptoms: Secondary | ICD-10-CM | POA: Diagnosis not present

## 2019-03-27 DIAGNOSIS — N4231 Prostatic intraepithelial neoplasia: Secondary | ICD-10-CM

## 2019-03-27 DIAGNOSIS — N138 Other obstructive and reflux uropathy: Secondary | ICD-10-CM

## 2019-03-27 DIAGNOSIS — R030 Elevated blood-pressure reading, without diagnosis of hypertension: Secondary | ICD-10-CM | POA: Diagnosis not present

## 2019-03-27 DIAGNOSIS — R7309 Other abnormal glucose: Secondary | ICD-10-CM

## 2019-03-27 DIAGNOSIS — Z0000 Encounter for general adult medical examination without abnormal findings: Secondary | ICD-10-CM

## 2019-03-27 NOTE — Assessment & Plan Note (Signed)
Followed by [facility] for BPH along with elevated PSA and abnormal prostate biopsy Now improving PSA Off finasteride  F/u 6 months Uro

## 2019-03-27 NOTE — Assessment & Plan Note (Signed)
Controlled PreDM with improved from >6 down to 5.7-5.8 last checks - did not have recent A1c in past 6 months Now without hyperglycemia. No Hypoglycemia  Plan:  1. No medication at this time. Remain off since improved lifestyle 2. Encourage improved lifestyle - low carb, low sugar diet, reduce portion size, emphasized importance of diet and may add exercise, did not adhere to changes last time 3. Follow-up 6 months annual phys and labs a1c

## 2019-03-27 NOTE — Assessment & Plan Note (Signed)
Followed by [facility] Last PSA improved, 02/2019, off finasteride, next visit 6 months

## 2019-03-27 NOTE — Assessment & Plan Note (Signed)
BMI >29 based on reported weight Improving lifestyle, weight down

## 2019-03-27 NOTE — Progress Notes (Signed)
Virtual Visit via Telephone The purpose of this virtual visit is to provide medical care while limiting exposure to the novel coronavirus (COVID19) for both patient and office staff.  Consent was obtained for phone visit:  Yes.   Answered questions that patient had about telehealth interaction:  Yes.   I discussed the limitations, risks, security and privacy concerns of performing an evaluation and management service by telephone. I also discussed with the patient that there may be a patient responsible charge related to this service. The patient expressed understanding and agreed to proceed.  Patient Location: Home Provider Location: [facility])  ---------------------------------------------------------------------- Chief Complaint  Patient presents with  . Pre-Diabetes  . Benign Prostatic Hypertrophy    S: Reviewed CMA telephone note below. I have called patient and gathered additional HPI as follows:  Elevated A1c /Overweight BMI >29 / Elevated BP without HTN Prior history of A1c up to 6.3 in past, recently has been stable 5.7 to 5.8. Did not have recent Lab A1c. Today reports doing well on improved lifestyle still Recent BP readings were normal. Lifestyle - Weightdown to 205 lbs by report - Diet:Continues healthy diet, limited when traveling for work eating out. Reduced starches / carbs and portions.Drinks water mostly  -Exercise:None regular. He is active most days if limited work he is doing outdoor cardio exercise Denies hypoglycemia  BPH LUTS / History of High Grade Prostate Intraepithelial Neoplasia Followed by [facility], prior biopsy, see their records. In past trial on Finasteride but made prostate symptoms worse, his PSA improved regardless. Last trend PSA in 4 range. He was seen 02/2019 and next f/u visit anticipated 6 months.  Denies any high risk travel to areas of current concern for COVID19. Denies any known or suspected exposure to  person with or possibly with COVID19.  Denies any fevers, chills, sweats, body ache, cough, shortness of breath, sinus pain or pressure, headache, abdominal pain, diarrhea  Past Medical History:  Diagnosis Date  . Elevated PSA   . Enlarged prostate   . 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 Night sweats   . Synovial cyst    Social History   Tobacco Use  . Smoking status: Never Smoker  . Smokeless tobacco: Never Used  Substance Use Topics  . Alcohol use: No    Comment: Quit- 1990  . Drug use: No    Current Outpatient Medications:  .  Ascorbic Acid (VITAMIN C PO), Take 1 capsule daily by mouth. , Disp: , Rfl:  .  aspirin EC 81 MG tablet, Take 81 mg by mouth daily., Disp: , Rfl:  .  AVANAFIL PO, Take 1 tablet by mouth as needed., Disp: , Rfl:  .  Multiple Vitamin (MULTIVITAMIN) capsule, Take 1 capsule by mouth daily. Reported on 12/24/2015, Disp: , Rfl:  .  VITAMIN E PO, Take by mouth., Disp: , Rfl:   Depression screen [facility] 2/9 03/27/2019 09/25/2018 06/24/2018  Decreased Interest 0 0 0  Down, Depressed, Hopeless 0 0 0  PHQ - 2 Score 0 0 0  Altered sleeping - - -  Tired, decreased energy - - -  Change in appetite - - -  Feeling bad or failure about yourself  - - -  Trouble concentrating - - -  Moving slowly or fidgety/restless - - -  Suicidal thoughts - - -  PHQ-9 Score - - -  Difficult doing work/chores - - -  No flowsheet data found.  -------------------------------------------------------------------------- O: No physical exam performed due to remote telephone encounter.  Reported weight 205 lbs  Lab results reviewed.  Recent Labs    06/24/18 0941 09/23/18 0806  HGBA1C 5.7* 5.8*    Recent Results (from the past 2160 hour(s))  PSA     Status: Abnormal   Collection Time: 02/12/19  9:48 AM  Result Value Ref Range   Prostate Specific Ag, Serum 4.1 (H) 0.0 - 4.0 ng/mL    Comment: Roche ECLIA  methodology. According to the American Urological Association, Serum PSA should decrease and remain at undetectable levels after radical prostatectomy. The AUA defines biochemical recurrence as an initial PSA value 0.2 ng/mL or greater followed by a subsequent confirmatory PSA value 0.2 ng/mL or greater. Values obtained with different assay methods or kits cannot be used interchangeably. Results cannot be interpreted as absolute evidence of the presence or absence of malignant disease.   BLADDER SCAN AMB NON-IMAGING     Status: None   Collection Time: 02/18/19  8:32 AM  Result Value Ref Range   Scan Result 28ml     -------------------------------------------------------------------------- A&P:  Problem List Items Addressed This Visit    BPH with obstruction/lower urinary tract symptoms    Followed by [facility] for BPH along with elevated PSA and abnormal prostate biopsy Now improving PSA Off finasteride  F/u 6 months Uro      Elevated BP without diagnosis of hypertension    Improved BP control by report, weight loss - Home BP readings normal range, reviewed No known complications     Plan:  1. HOLD any future medicine for now - previously discussed Pre-HTN vs HTN and may need medicines, lifestyle management 2. Encourage improved lifestyle - low sodium diet, may add regular exercise in addition to regular work 3. Continue monitor BP outside office, bring readings to next visit, if persistently >140/90 or new symptoms notify office sooner  F/u q 6 mo for physical      Elevated hemoglobin A1c - Primary    Controlled PreDM with improved from >6 down to 5.7-5.8 last checks - did not have recent A1c in past 6 months Now without hyperglycemia. No Hypoglycemia  Plan:  1. No medication at this time. Remain off since improved lifestyle 2. Encourage improved lifestyle - low carb, low sugar diet, reduce portion size, emphasized importance of diet and may add exercise, did not adhere to  changes last time 3. Follow-up 6 months annual phys and labs a1c      High grade prostatic intraepithelial neoplasia    Followed by [facility] Last PSA improved, 02/2019, off finasteride, next visit 6 months      Overweight (BMI 25.0-29.9)    BMI >29 based on reported weight Improving lifestyle, weight down         No orders of the defined types were placed in this encounter.   Follow-up: - Return in 6 months for annual physical and labs - Future labs ordered for 09/22/19  Patient verbalizes understanding with the above medical recommendations including the limitation of remote medical advice.  Specific follow-up and call-back criteria were given for patient to follow-up or seek medical care more urgently if needed.   - Time spent in direct consultation with patient on phone: 8 minutes  Nobie Putnam, [facility] 03/27/2019, 8:09 AM

## 2019-03-27 NOTE — Assessment & Plan Note (Signed)
Improved BP control by report, weight loss - Home BP readings normal range, reviewed No known complications     Plan:  1. HOLD any future medicine for now - previously discussed Pre-HTN vs HTN and may need medicines, lifestyle management 2. Encourage improved lifestyle - low sodium diet, may add regular exercise in addition to regular work 3. Continue monitor BP outside office, bring readings to next visit, if persistently >140/90 or new symptoms notify office sooner  F/u q 6 mo for physical

## 2019-03-27 NOTE — Patient Instructions (Addendum)
  DUE for FASTING BLOOD WORK (no food or drink after midnight before the lab appointment, only water or coffee without cream/sugar on the morning of)  SCHEDULE "Lab Only" visit in the morning at the clinic for lab draw in 6 MONTHS   - Make sure Lab Only appointment is at about 1 week before your next appointment, so that results will be available  For Lab Results, once available within 2-3 days of blood draw, you can can log in to MyChart online to view your results and a brief explanation. Also, we can discuss results at next follow-up visit.   Please schedule a Follow-up Appointment to: Return in about 6 months (around 09/26/2019) for Annual Physical.  If you have any other questions or concerns, please feel free to call the office or send a message through [facility]. You may also schedule an earlier appointment if necessary.  Additionally, you may be receiving a survey about your experience at our office within a few days to 1 week by e-mail or mail. We value your feedback.  Nobie Putnam, DO [place]

## 2019-08-20 ENCOUNTER — Other Ambulatory Visit: Payer: Self-pay | Admitting: *Deleted

## 2019-08-20 DIAGNOSIS — R972 Elevated prostate specific antigen [PSA]: Secondary | ICD-10-CM

## 2019-08-22 ENCOUNTER — Other Ambulatory Visit: Payer: Self-pay

## 2019-08-22 ENCOUNTER — Other Ambulatory Visit: Payer: BLUE CROSS/BLUE SHIELD

## 2019-08-22 DIAGNOSIS — R972 Elevated prostate specific antigen [PSA]: Secondary | ICD-10-CM

## 2019-08-23 LAB — PSA: Prostate Specific Ag, Serum: 5.8 ng/mL — ABNORMAL HIGH (ref 0.0–4.0)

## 2019-08-26 NOTE — Progress Notes (Signed)
02/18/2019 9:02 AM   Burlon A Gobert 07-17-1951 [id number]  Referring provider: Olin Hauser, DO [address] [place],  [place] [address]  Chief Complaint  Patient presents with  . Benign Prostatic Hypertrophy    HPI: Walter Hebert is a 68 y.o. 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  Elevated PSA MRI of prostate on 07/11/2018 noted no findings of macroscopic or high-grade prostate carcinoma.  Moderate benign prostatic hyperplasia, resulting in compression of the peripheral zone.  Bladder wall thickening, suggesting a component of outlet obstruction. Current PSA 5.8 in 08/2019. PSAD 0.084.  PSAD less than 0.15 ng/mL/mL was the most well studied and accurate negative predictive factor for clinically significant prostate cancer diagnosis.  Recommend the use of PSAD less than 0.15 ng/mL/mL along with negative MRI results to omit biopsy indication in cancer naive patients.  Predictive Factors of Missed Clinically Significant Prostate Cancers in Men with Negative Magnetic Resonance Imaging: A Systematic Review and Meta-Analysis.  Journal of Urology: Vol. 204, 24-32 July 2020.   Elevated PSA PSA Trend   8.3 in 12/2013 - bx positive for HGPIN  5.2 in 10/2014  6.3 in 06/2015  4.5 in 09/2015  4.5 in 11/2015  4.5 in 10/2017 - started finasteride  3.5 in 04/2016  4.2 in 10/2016 - stopped finasteride   4.1 in 04/2017  4.3 in 10/2017  6.2 in 04/2018   MRI negative for high grade cancer   Restart finasteride 5 mg on 07/15/2018  4.6 in 10/2018  4.1 in 01/2019 - No finasteride - stopped it two months ago  5.8 in 08/2019  HGPIN Patient underwent prostate biopsy on 04/27/2014 for a PSA of 8.3 ng/mL and was found to have a 39 gram prostate and two cores positive for HGPIN.   BPH WITH LUTS  (prostate and/or bladder) IPSS score: 3/1    Previous score: 3/1  Major complaint(s): None.  Patient denies any gross  hematuria, dysuria or suprapubic/flank pain.  Patient denies any fevers, chills, nausea or vomiting.   Patient drinks water with lemon juice, occasionally soda, and has been adding Prekese tea to his diet.  He has a family history of PCa, with his brother having had fatal prostate cancer.  IPSS    Row Name 08/27/19 0800         International Prostate Symptom Score   How often have you had the sensation of not emptying your bladder?  Not at All     How often have you had to urinate less than every two hours?  Less than 1 in 5 times     How often have you found you stopped and started again several times when you urinated?  Not at All     How often have you found it difficult to postpone urination?  Less than 1 in 5 times     How often have you had a weak urinary stream?  Not at All     How often have you had to strain to start urination?  Not at All     How many times did you typically get up at night to urinate?  1 Time     Total IPSS Score  3       Quality of Life due to urinary symptoms   If you were to spend the rest of your life with your urinary condition just  the way it is now how would you feel about that?  Pleased        Score:  1-7 Mild 8-19 Moderate 20-35 Severe  Erectile dysfunction His SHIM score is 16, which is mil-moderate ED.   His previous SHIM score was 15.  He has been having difficulty with erections for many years.  His major complaint is in getting an erection.  His libido is preserved.  His risk factors for ED are age and BPH.  He denies any painful erections or curvatures with his erections.   He is still having having occasional spontaneous erections.  He has tried PDE5i in the past, with success.  He is not interested in pursuing treatment at this time.  SHIM    Row Name 08/27/19 0849         SHIM: Over the last 6 months:   How do you rate your confidence that you could get and keep an erection?  Moderate     When you had erections with sexual  stimulation, how often were your erections hard enough for penetration (entering your partner)?  Sometimes (about half the time)     During sexual intercourse, how often were you able to maintain your erection after you had penetrated (entered) your partner?  Almost Always or Always     During sexual intercourse, how difficult was it to maintain your erection to completion of intercourse?  Not Difficult       SHIM Total Score   SHIM  16       Score: 1-7 Severe ED 8-11 Moderate ED 12-16 Mild-Moderate ED 17-21 Mild ED 22-25 No ED  PMH: Past Medical History:  Diagnosis Date  . Elevated PSA   . Enlarged prostate   . 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 Night sweats   . Synovial cyst     Surgical History: Past Surgical History:  Procedure Laterality Date  . COLONOSCOPY N/A 05/18/2015   Procedure: COLONOSCOPY;  Surgeon: Lucilla Lame, MD;  Location: [facility] ENDOSCOPY;  Service: Endoscopy;  Laterality: N/A;    Home Medications:  Allergies as of 08/27/2019      Reactions   Chloroquine Itching   Hydroxychloroquine Sulfate Hives      Medication List       Accurate as of August 27, 2019  9:02 AM. If you have any questions, ask your nurse or doctor.        aspirin EC 81 MG tablet Take 81 mg by mouth daily.   AVANAFIL PO Take 1 tablet by mouth as needed.   multivitamin capsule Take 1 capsule by mouth daily. Reported on 12/24/2015   VITAMIN C PO Take 1 capsule daily by mouth.   VITAMIN E PO Take by mouth.       Allergies:  Allergies  Allergen Reactions  . Chloroquine Itching  . Hydroxychloroquine Sulfate Hives    Family History: Family History  Problem Relation Age of Onset  . Hyperlipidemia Unknown   . Prostate cancer Brother        DECEASED  . Healthy Father   . Healthy Mother   . Kidney disease Neg Hx     Social History:  reports that he has never smoked. He has never used smokeless tobacco. He  reports that he does not drink alcohol or use drugs.  ROS: UROLOGY Frequent Urination?: No Hard to postpone urination?: No Burning/pain with urination?: No Get up at night  to urinate?: No Leakage of urine?: No Urine stream starts and stops?: No Trouble starting stream?: No Do you have to strain to urinate?: No Blood in urine?: No Urinary tract infection?: No Sexually transmitted disease?: No Injury to kidneys or bladder?: No Painful intercourse?: No Weak stream?: No Erection problems?: No Penile pain?: No Gastrointestinal Nausea?: No Vomiting?: No Indigestion/heartburn?: No Diarrhea?: No Constipation?: No Constitutional Fever: No Night sweats?: No Weight loss?: No Fatigue?: No Skin Skin rash/lesions?: No Itching?: No Eyes Blurred vision?: No Double vision?: No Ears/Nose/Throat Sore throat?: No Sinus problems?: No Hematologic/Lymphatic Swollen glands?: No Easy bruising?: No Cardiovascular Leg swelling?: No Chest pain?: No Respiratory Cough?: No Shortness of breath?: No Endocrine Excessive thirst?: No Musculoskeletal Back pain?: No Joint pain?: No Neurological Headaches?: No Dizziness?: No Psychologic Depression?: No Anxiety?: No  Physical Exam: BP (!) 152/83 (BP Location: Left Arm, Patient Position: Sitting, Cuff Size: Normal)   Ht 5\' 10"  (1.778 m)   Wt 203 lb (92.1 kg)   BMI 29.13 kg/m   Constitutional:  Well nourished. Alert and oriented, No acute distress. HEENT: [place] AT, moist mucus membranes.  Trachea midline, no masses. Cardiovascular: No clubbing, cyanosis, or edema. Respiratory: Normal respiratory effort, no increased work of breathing. GI: Abdomen is soft, non tender, non distended, no abdominal masses. Liver and spleen not palpable.  No hernias appreciated.  Stool sample for occult testing is not indicated.   GU: No CVA tenderness.  No bladder fullness or masses.  Patient with circumcised phallus.  Urethral meatus is patent.  No penile  discharge. No penile lesions or rashes. Scrotum without lesions, cysts, rashes and/or edema.  Testicles are located scrotally bilaterally. No masses are appreciated in the testicles. Left and right epididymis are normal. Rectal: Patient with  normal sphincter tone. Anus and perineum without scarring or rashes. No rectal masses are appreciated. Prostate is approximately 60 + grams, could only palpate the apex and midportion of the gland, no nodules are appreciated. Seminal vesicles could  Not be palpated.   Skin: No rashes, bruises or suspicious lesions. Lymph: No inguinal adenopathy. Neurologic: Grossly intact, no focal deficits, moving all 4 extremities. Psychiatric: Normal mood and affect.  Laboratory Data: Lab Results  Component Value Date   WBC 3.2 (L) 09/23/2018   HGB 14.9 09/23/2018   HCT 47.1 09/23/2018   MCV 75.7 (L) 09/23/2018   PLT 160 09/23/2018   Lab Results  Component Value Date   CREATININE 1.03 09/23/2018   PSA See HPI  Lab Results  Component Value Date   HGBA1C 5.8 (H) 09/23/2018      Component Value Date/Time   CHOL 218 (H) 09/23/2018 0806   HDL 62 09/23/2018 0806   CHOLHDL 3.5 09/23/2018 0806   LDLCALC 136 (H) 09/23/2018 0806    Lab Results  Component Value Date   AST 23 09/23/2018   Lab Results  Component Value Date   ALT 19 09/23/2018   I have reviewed the labs.  Assessment & Plan:    1. Elevated PSA - Stable - RTC in 6 months for PSA   2. 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  PSA at time of biopsy was 8.3 ng/mL - MRI in 06/2018 negative for findings of macroscopic/high-grade prostate carcinoma  3. BPH with LUTS - I PSS score is 3/1, it is stable - Continue conservative management, timed voidings and avoiding bladder irritants - RTC in 6 months for I PSS, exam and PSA  4. Erectile dysfunction -  SHIM score is 15, it is stable - At this point, patient would prefer to defer pursuing medication - RTC in 6 months for SHIM  score and exam  5. Family history of prostate cancer - Brother with fatal prostate cancer  Return in about 6 months (around 02/24/2020) for IPSS, PSA and exam.  Zara Council, [facility]  [place] [address] [place], [place] [address] [phone]

## 2019-08-27 ENCOUNTER — Other Ambulatory Visit: Payer: Self-pay

## 2019-08-27 ENCOUNTER — Encounter: Payer: Self-pay | Admitting: Urology

## 2019-08-27 ENCOUNTER — Ambulatory Visit: Payer: BLUE CROSS/BLUE SHIELD | Admitting: Urology

## 2019-08-27 VITALS — BP 152/83 | Ht 70.0 in | Wt 203.0 lb

## 2019-08-27 DIAGNOSIS — R972 Elevated prostate specific antigen [PSA]: Secondary | ICD-10-CM

## 2019-08-27 DIAGNOSIS — Z8042 Family history of malignant neoplasm of prostate: Secondary | ICD-10-CM

## 2019-08-27 DIAGNOSIS — N401 Enlarged prostate with lower urinary tract symptoms: Secondary | ICD-10-CM

## 2019-08-27 DIAGNOSIS — N529 Male erectile dysfunction, unspecified: Secondary | ICD-10-CM | POA: Diagnosis not present

## 2019-08-27 DIAGNOSIS — N138 Other obstructive and reflux uropathy: Secondary | ICD-10-CM

## 2019-09-22 ENCOUNTER — Other Ambulatory Visit: Payer: Self-pay

## 2019-09-22 ENCOUNTER — Other Ambulatory Visit: Payer: BLUE CROSS/BLUE SHIELD

## 2019-09-22 DIAGNOSIS — E781 Pure hyperglyceridemia: Secondary | ICD-10-CM

## 2019-09-22 DIAGNOSIS — R030 Elevated blood-pressure reading, without diagnosis of hypertension: Secondary | ICD-10-CM

## 2019-09-22 DIAGNOSIS — R7309 Other abnormal glucose: Secondary | ICD-10-CM

## 2019-09-22 DIAGNOSIS — Z0000 Encounter for general adult medical examination without abnormal findings: Secondary | ICD-10-CM

## 2019-09-23 LAB — COMPLETE METABOLIC PANEL WITH GFR
AG Ratio: 1.4 (calc) (ref 1.0–2.5)
ALT: 18 U/L (ref 9–46)
AST: 21 U/L (ref 10–35)
Albumin: 4.1 g/dL (ref 3.6–5.1)
Alkaline phosphatase (APISO): 57 U/L (ref 35–144)
BUN: 13 mg/dL (ref 7–25)
CO2: 28 mmol/L (ref 20–32)
Calcium: 9.9 mg/dL (ref 8.6–10.3)
Chloride: 104 mmol/L (ref 98–110)
Creat: 1.03 mg/dL (ref 0.70–1.25)
GFR, Est African American: 87 mL/min/{1.73_m2} (ref >=60)
GFR, Est Non African American: 75 mL/min/{1.73_m2} (ref >=60)
Globulin: 3 g/dL (calc) (ref 1.9–3.7)
Glucose, Bld: 87 mg/dL (ref 65–99)
Potassium: 4.5 mmol/L (ref 3.5–5.3)
Sodium: 140 mmol/L (ref 135–146)
Total Bilirubin: 0.7 mg/dL (ref 0.2–1.2)
Total Protein: 7.1 g/dL (ref 6.1–8.1)

## 2019-09-23 LAB — LIPID PANEL
Cholesterol: 222 mg/dL — ABNORMAL HIGH (ref <200)
HDL: 61 mg/dL (ref >=40)
LDL Cholesterol (Calc): 145 mg/dL (calc) — ABNORMAL HIGH
Non-HDL Cholesterol (Calc): 161 mg/dL (calc) — ABNORMAL HIGH (ref <130)
Total CHOL/HDL Ratio: 3.6 (calc) (ref <5.0)
Triglycerides: 68 mg/dL (ref <150)

## 2019-09-23 LAB — CBC WITH DIFFERENTIAL/PLATELET
Absolute Monocytes: 381 cells/uL (ref 200–950)
Basophils Absolute: 30 cells/uL (ref 0–200)
Basophils Relative: 0.8 %
Eosinophils Absolute: 141 cells/uL (ref 15–500)
Eosinophils Relative: 3.8 %
HCT: 47.1 % (ref 38.5–50.0)
Hemoglobin: 14.9 g/dL (ref 13.2–17.1)
Lymphs Abs: 1954 cells/uL (ref 850–3900)
MCH: 24.3 pg — ABNORMAL LOW (ref 27.0–33.0)
MCHC: 31.6 g/dL — ABNORMAL LOW (ref 32.0–36.0)
MCV: 76.7 fL — ABNORMAL LOW (ref 80.0–100.0)
MPV: 11.6 fL (ref 7.5–12.5)
Monocytes Relative: 10.3 %
Neutro Abs: 1195 cells/uL — ABNORMAL LOW (ref 1500–7800)
Neutrophils Relative %: 32.3 %
Platelets: 177 10*3/uL (ref 140–400)
RBC: 6.14 10*6/uL — ABNORMAL HIGH (ref 4.20–5.80)
RDW: 14.4 % (ref 11.0–15.0)
Total Lymphocyte: 52.8 %
WBC: 3.7 10*3/uL — ABNORMAL LOW (ref 3.8–10.8)

## 2019-09-23 LAB — HEMOGLOBIN A1C
Hgb A1c MFr Bld: 5.5 % of total Hgb (ref <5.7)
Mean Plasma Glucose: 111 (calc)
eAG (mmol/L): 6.2 (calc)

## 2019-09-24 ENCOUNTER — Encounter: Payer: BLUE CROSS/BLUE SHIELD | Admitting: Family Medicine

## 2019-09-25 ENCOUNTER — Encounter: Payer: BLUE CROSS/BLUE SHIELD | Admitting: Family Medicine

## 2019-09-25 ENCOUNTER — Other Ambulatory Visit: Payer: Self-pay

## 2019-09-26 ENCOUNTER — Encounter: Payer: BLUE CROSS/BLUE SHIELD | Admitting: Family Medicine

## 2019-10-09 ENCOUNTER — Encounter: Payer: Self-pay | Admitting: Family Medicine

## 2019-10-09 ENCOUNTER — Other Ambulatory Visit: Payer: Self-pay | Admitting: Family Medicine

## 2019-10-09 ENCOUNTER — Ambulatory Visit (INDEPENDENT_AMBULATORY_CARE_PROVIDER_SITE_OTHER): Payer: BLUE CROSS/BLUE SHIELD | Admitting: Family Medicine

## 2019-10-09 ENCOUNTER — Other Ambulatory Visit: Payer: Self-pay

## 2019-10-09 VITALS — BP 131/71 | HR 56 | Temp 97.8°F | Resp 18 | Ht 67.5 in | Wt 206.0 lb

## 2019-10-09 DIAGNOSIS — Z0000 Encounter for general adult medical examination without abnormal findings: Secondary | ICD-10-CM

## 2019-10-09 DIAGNOSIS — R7309 Other abnormal glucose: Secondary | ICD-10-CM

## 2019-10-09 DIAGNOSIS — E669 Obesity, unspecified: Secondary | ICD-10-CM | POA: Diagnosis not present

## 2019-10-09 DIAGNOSIS — E781 Pure hyperglyceridemia: Secondary | ICD-10-CM

## 2019-10-09 DIAGNOSIS — Z23 Encounter for immunization: Secondary | ICD-10-CM | POA: Diagnosis not present

## 2019-10-09 DIAGNOSIS — R030 Elevated blood-pressure reading, without diagnosis of hypertension: Secondary | ICD-10-CM

## 2019-10-09 DIAGNOSIS — R972 Elevated prostate specific antigen [PSA]: Secondary | ICD-10-CM

## 2019-10-09 DIAGNOSIS — E7800 Pure hypercholesterolemia, unspecified: Secondary | ICD-10-CM

## 2019-10-09 NOTE — Assessment & Plan Note (Signed)
Improved now 5.5 A1c, controlled Keep improving lifestyle Check yearly A1c

## 2019-10-09 NOTE — Assessment & Plan Note (Signed)
Mild wt gain but overall remains improved healthy lifestyle Keep working on lifestyle wt loss

## 2019-10-09 NOTE — Assessment & Plan Note (Signed)
Resolved. Monitor.  

## 2019-10-09 NOTE — Patient Instructions (Addendum)
Thank you for coming to the office today.  Flu shot and final Pneumonia vaccine today  You have thick impacted ear wax (cerumen) blocking ear canals and ear drums. This is the most likely cause of your ear discomfort  Recommend using the over the counter Debrox (Carbamide peroxide), use on both sides following instructions on bottle, pharmacist will direct you to the appropriate ear drops if you need help. May take a week or more.  Avoid using Q-tips inside ears, as this can push wax deeper, but you can try to use rolled up kleenex as a wick to absorb fluid and wax as well.  If you are not making progress with ear wax removal at home, or the problem keeps coming back, please notify our office and we can schedule you for an Ear Wax Removal here  DUE for FASTING BLOOD WORK (no food or drink after midnight before the lab appointment, only water or coffee without cream/sugar on the morning of)  SCHEDULE "Lab Only" visit in the morning at the clinic for lab draw in 1 YEAR  - Make sure Lab Only appointment is at about 1 week before your next appointment, so that results will be available  For Lab Results, once available within 2-3 days of blood draw, you can can log in to MyChart online to view your results and a brief explanation. Also, we can discuss results at next follow-up visit.   Please schedule a Follow-up Appointment to: Return in about 1 year (around 10/08/2020) for Annual Physical.  If you have any other questions or concerns, please feel free to call the office or send a message through [place]. You may also schedule an earlier appointment if necessary.  Additionally, you may be receiving a survey about your experience at our office within a few days to 1 week by e-mail or mail. We value your feedback.  Nobie Putnam, DO [place]

## 2019-10-09 NOTE — Progress Notes (Signed)
Subjective:    Patient ID: Walter Hebert, male    DOB: February 11, 1951, 68 y.o.   MRN: [id number]  Walter Hebert is a 68 y.o. male presenting on 10/09/2019 for Annual Exam   HPI  Here for Annual Physical and Lab Review.  Elevated A1c /Obesity BMI >31 / Elevated BP without HTN Improved A1c to 5.5 on last lab, from 5.8 BP readings have improved. Not on medication Today reports doing well on improved lifestyle still Lifestyle Weight stable - Diet:Continues healthy diet, reduced portion in PM avoid late night large meal before bed.Reduced starches / carbs and portions.Drinks water mostly, also warm water with lemon/lime in AM -Exercise:None regular. He is active most days if limited work he is doing outdoor cardio exercise Denies hypoglycemia  BPH LUTS / History of High Grade Prostate Intraepithelial Neoplasia Followed by [facility], prior biopsy, see their records. In past trial on Finasteride but made prostate symptoms worse, his PSA improved regardless. Last trend PSA in 4 to 5.8 on last lab.  HYPERLIPIDEMIA Last lipid 09/2019, remains stable LDL 140s - Not on any cholesterol medication at this time  Health Maintenance: -Due for Flu Shot, will receive today  Prostate CA Screening: Followed by [facility], history of elevated PSA.  Due for 2nd pneumonia vaccine >1 year after previous vaccine, now to receive Pneumovax-23 today  Colon CA Screening: Last Colonoscopy 05/18/15 (done by Dr [facility]), results with hyperplastic polyp benign, good for 10 years. Currently asymptomatic. No known family history of colon CA. Next due in 2026.     Depression screen [facility] 2/9 10/09/2019 03/27/2019 09/25/2018  Decreased Interest 0 0 0  Down, Depressed, Hopeless 0 0 0  PHQ - 2 Score 0 0 0  Altered sleeping 0 - -  Tired, decreased energy 0 - -  Change in appetite 0 - -  Feeling bad or failure about yourself  0 - -  Trouble concentrating 0 - -  Moving slowly or fidgety/restless 0 - -  Suicidal  thoughts 0 - -  PHQ-9 Score 0 - -  Difficult doing work/chores - - -    Past Medical History:  Diagnosis Date  . Elevated PSA   . Enlarged prostate   . 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 Night sweats   . Synovial cyst    Past Surgical History:  Procedure Laterality Date  . COLONOSCOPY N/A 05/18/2015   Procedure: COLONOSCOPY;  Surgeon: Lucilla Lame, MD;  Location: [facility] ENDOSCOPY;  Service: Endoscopy;  Laterality: N/A;   Social History   Socioeconomic History  . Marital status: Married    Spouse name: Not on file  . Number of children: Not on file  . Years of education: Not on file  . Highest education level: Not on file  Occupational History  . Not on file  Social Needs  . Financial resource strain: Not on file  . Food insecurity    Worry: Not on file    Inability: Not on file  . Transportation needs    Medical: Not on file    Non-medical: Not on file  Tobacco Use  . Smoking status: Never Smoker  . Smokeless tobacco: Never Used  Substance and Sexual Activity  . Alcohol use: No    Comment: Quit- 1990  . Drug use: No  . Sexual activity: Not on file  Lifestyle  . Physical activity    Days per week: Not on  file    Minutes per session: Not on file  . Stress: Not on file  Relationships  . Social Herbalist on phone: Not on file    Gets together: Not on file    Attends religious service: Not on file    Active member of club or organization: Not on file    Attends meetings of clubs or organizations: Not on file    Relationship status: Not on file  . Intimate partner violence    Fear of current or ex partner: Not on file    Emotionally abused: Not on file    Physically abused: Not on file    Forced sexual activity: Not on file  Other Topics Concern  . Not on file  Social History Narrative  . Not on file   Family History  Problem Relation Age of Onset  . Hyperlipidemia Unknown   . Prostate  cancer Brother        DECEASED  . Healthy Father   . Healthy Mother   . Kidney disease Neg Hx    Current Outpatient Medications on File Prior to Visit  Medication Sig  . Ascorbic Acid (VITAMIN C PO) Take 1 capsule daily by mouth.   . Multiple Vitamin (MULTIVITAMIN) capsule Take 1 capsule by mouth daily. Reported on 12/24/2015  . VITAMIN E PO Take by mouth.   No current facility-administered medications on file prior to visit.     Review of Systems  Constitutional: Negative for activity change, appetite change, chills, diaphoresis, fatigue and fever.  HENT: Negative for congestion and hearing loss.   Eyes: Negative for visual disturbance.  Respiratory: Negative for apnea, cough, chest tightness, shortness of breath and wheezing.   Cardiovascular: Negative for chest pain, palpitations and leg swelling.  Gastrointestinal: Negative for abdominal pain, anal bleeding, blood in stool, constipation, diarrhea, nausea and vomiting.  Endocrine: Negative for cold intolerance.  Genitourinary: Negative for difficulty urinating, dysuria, frequency and hematuria.  Musculoskeletal: Negative for arthralgias, back pain and neck pain.  Skin: Negative for rash.  Allergic/Immunologic: Negative for environmental allergies.  Neurological: Negative for dizziness, weakness, light-headedness, numbness and headaches.  Hematological: Negative for adenopathy.  Psychiatric/Behavioral: Negative for behavioral problems, dysphoric mood and sleep disturbance. The patient is not nervous/anxious.    Per HPI unless specifically indicated above     Objective:    BP 131/71 (BP Location: Left Arm, Patient Position: Sitting, Cuff Size: Normal)   Pulse (!) 56   Temp 97.8 F (36.6 C) (Oral)   Resp 18   Ht 5' 7.5" (1.715 m)   Wt 206 lb (93.4 kg)   SpO2 100%   BMI 31.79 kg/m   Wt Readings from Last 3 Encounters:  10/09/19 206 lb (93.4 kg)  08/27/19 203 lb (92.1 kg)  03/27/19 205 lb (93 kg)    Physical Exam  Vitals signs and nursing note reviewed.  Constitutional:      General: He is not in acute distress.    Appearance: He is well-developed. He is not diaphoretic.     Comments: Well-appearing, comfortable, cooperative  HENT:     Head: Normocephalic and atraumatic.  Eyes:     General:        Right eye: No discharge.        Left eye: No discharge.     Conjunctiva/sclera: Conjunctivae normal.     Pupils: Pupils are equal, round, and reactive to light.  Neck:     Musculoskeletal: Normal range of motion  and neck supple.     Thyroid: No thyromegaly.     Comments: No carotid bruits Cardiovascular:     Rate and Rhythm: Normal rate and regular rhythm.     Heart sounds: Normal heart sounds. No murmur.  Pulmonary:     Effort: Pulmonary effort is normal. No respiratory distress.     Breath sounds: Normal breath sounds. No wheezing or rales.  Abdominal:     General: Bowel sounds are normal. There is no distension.     Palpations: Abdomen is soft. There is no mass.     Tenderness: There is no abdominal tenderness.  Musculoskeletal: Normal range of motion.        General: No tenderness.     Comments: Upper / Lower Extremities: - Normal muscle tone, strength bilateral upper extremities 5/5, lower extremities 5/5  Lymphadenopathy:     Cervical: No cervical adenopathy.  Skin:    General: Skin is warm and dry.     Findings: No erythema or rash.     Comments: R posterior neck, small 1 cm or less cystic nodule.  Neurological:     Mental Status: He is alert and oriented to person, place, and time.     Comments: Distal sensation intact to light touch all extremities  Psychiatric:        Behavior: Behavior normal.     Comments: Well groomed, good eye contact, normal speech and thoughts    Results for orders placed or performed in visit on 09/22/19  Lipid panel  Result Value Ref Range   Cholesterol 222 (H) <200 mg/dL   HDL 61 > OR = 40 mg/dL   Triglycerides 68 <150 mg/dL   LDL Cholesterol  (Calc) 145 (H) mg/dL (calc)   Total CHOL/HDL Ratio 3.6 <5.0 (calc)   Non-HDL Cholesterol (Calc) 161 (H) <130 mg/dL (calc)  COMPLETE METABOLIC PANEL WITH GFR  Result Value Ref Range   Glucose, Bld 87 65 - 99 mg/dL   BUN 13 7 - 25 mg/dL   Creat 1.03 0.70 - 1.25 mg/dL   GFR, Est Non African American 75 > OR = 60 mL/min/1.51m2   GFR, Est African American 87 > OR = 60 mL/min/1.42m2   BUN/Creatinine Ratio NOT APPLICABLE 6 - 22 (calc)   Sodium 140 135 - 146 mmol/L   Potassium 4.5 3.5 - 5.3 mmol/L   Chloride 104 98 - 110 mmol/L   CO2 28 20 - 32 mmol/L   Calcium 9.9 8.6 - 10.3 mg/dL   Total Protein 7.1 6.1 - 8.1 g/dL   Albumin 4.1 3.6 - 5.1 g/dL   Globulin 3.0 1.9 - 3.7 g/dL (calc)   AG Ratio 1.4 1.0 - 2.5 (calc)   Total Bilirubin 0.7 0.2 - 1.2 mg/dL   Alkaline phosphatase (APISO) 57 35 - 144 U/L   AST 21 10 - 35 U/L   ALT 18 9 - 46 U/L  CBC with Differential/Platelet  Result Value Ref Range   WBC 3.7 (L) 3.8 - 10.8 Thousand/uL   RBC 6.14 (H) 4.20 - 5.80 Million/uL   Hemoglobin 14.9 13.2 - 17.1 g/dL   HCT 47.1 38.5 - 50.0 %   MCV 76.7 (L) 80.0 - 100.0 fL   MCH 24.3 (L) 27.0 - 33.0 pg   MCHC 31.6 (L) 32.0 - 36.0 g/dL   RDW 14.4 11.0 - 15.0 %   Platelets 177 140 - 400 Thousand/uL   MPV 11.6 7.5 - 12.5 fL   Neutro Abs 1,195 (L) 1,500 - 7,800  cells/uL   Lymphs Abs 1,954 850 - 3,900 cells/uL   Absolute Monocytes 381 200 - 950 cells/uL   Eosinophils Absolute 141 15 - 500 cells/uL   Basophils Absolute 30 0 - 200 cells/uL   Neutrophils Relative % 32.3 %   Total Lymphocyte 52.8 %   Monocytes Relative 10.3 %   Eosinophils Relative 3.8 %   Basophils Relative 0.8 %  Hemoglobin A1c  Result Value Ref Range   Hgb A1c MFr Bld 5.5 <5.7 % of total Hgb   Mean Plasma Glucose 111 (calc)   eAG (mmol/L) 6.2 (calc)      Assessment & Plan:   Problem List Items Addressed This Visit    Obesity (BMI 30.0-34.9)    Mild wt gain but overall remains improved healthy lifestyle Keep working on  lifestyle wt loss      Hyperlipidemia    Stable to improved LDL 140s Last lipid panel 09/2019 Calculated ASCVD 10 yr risk score 8.4%  Plan: 1. Discussion on ASCVD risk reduction strategies - may be candidate for statin, hold on this for now, re-consider 2. Encourage improved lifestyle - low carb/cholesterol, reduce portion size,recommend exercise Follow-up yearly lipid      Elevated PSA    Followed by [facility] Last PSA 5.8, 08/2019      Elevated hemoglobin A1c    Improved now 5.5 A1c, controlled Keep improving lifestyle Check yearly A1c      RESOLVED: Elevated BP without diagnosis of hypertension    Resolved Monitor       Other Visit Diagnoses    Annual physical exam    -  Primary   Needs flu shot       Relevant Orders   Flu Vaccine QUAD High Dose(Fluad) (Completed)   Need for 23-polyvalent pneumococcal polysaccharide vaccine       Relevant Orders   Pneumococcal polysaccharide vaccine 23-valent greater than or equal to 2yo subcutaneous/IM (Completed)     Updated Health Maintenance information - High dose Flu today - Pneumovax-23 today, final dose of PNA vax Reviewed recent lab results with patient Encouraged improvement to lifestyle with diet and exercise Maintain healthy weight   No orders of the defined types were placed in this encounter.   Follow up plan: Return in about 1 year (around 10/08/2020) for Annual Physical.   Future labs 10/06/20  Nobie Putnam, [facility] 10/09/2019, 9:50 AM

## 2019-10-09 NOTE — Assessment & Plan Note (Signed)
Followed by [facility] Last PSA 5.8, 08/2019

## 2019-10-09 NOTE — Assessment & Plan Note (Signed)
Stable to improved LDL 140s Last lipid panel 09/2019 Calculated ASCVD 10 yr risk score 8.4%  Plan: 1. Discussion on ASCVD risk reduction strategies - may be candidate for statin, hold on this for now, re-consider 2. Encourage improved lifestyle - low carb/cholesterol, reduce portion size,recommend exercise Follow-up yearly lipid

## 2020-02-10 ENCOUNTER — Ambulatory Visit: Payer: Self-pay | Attending: Internal Medicine

## 2020-02-10 DIAGNOSIS — Z20822 Contact with and (suspected) exposure to covid-19: Secondary | ICD-10-CM

## 2020-02-11 LAB — NOVEL CORONAVIRUS, NAA: SARS-CoV-2, NAA: NOT DETECTED

## 2020-03-12 ENCOUNTER — Other Ambulatory Visit: Payer: Self-pay

## 2020-03-12 DIAGNOSIS — R972 Elevated prostate specific antigen [PSA]: Secondary | ICD-10-CM

## 2020-03-15 ENCOUNTER — Encounter: Payer: Self-pay | Admitting: Urology

## 2020-03-15 ENCOUNTER — Other Ambulatory Visit: Payer: BLUE CROSS/BLUE SHIELD

## 2020-03-17 ENCOUNTER — Encounter: Payer: Self-pay | Admitting: Urology

## 2020-03-17 ENCOUNTER — Ambulatory Visit (INDEPENDENT_AMBULATORY_CARE_PROVIDER_SITE_OTHER): Payer: Self-pay | Admitting: Urology

## 2020-03-17 ENCOUNTER — Other Ambulatory Visit: Payer: Self-pay

## 2020-03-17 VITALS — BP 134/81 | HR 60 | Ht 68.0 in | Wt 205.0 lb

## 2020-03-17 DIAGNOSIS — N529 Male erectile dysfunction, unspecified: Secondary | ICD-10-CM

## 2020-03-17 DIAGNOSIS — N138 Other obstructive and reflux uropathy: Secondary | ICD-10-CM

## 2020-03-17 DIAGNOSIS — N401 Enlarged prostate with lower urinary tract symptoms: Secondary | ICD-10-CM

## 2020-03-17 DIAGNOSIS — R972 Elevated prostate specific antigen [PSA]: Secondary | ICD-10-CM

## 2020-03-17 LAB — BLADDER SCAN AMB NON-IMAGING: Scan Result: 135

## 2020-03-17 NOTE — Progress Notes (Signed)
02/18/2019 4:52 PM   Loomis A Giarratano 1951/05/24 [id number]  Referring provider: Olin Hauser, DO [address] [place],  [place] [address]  Chief Complaint  Patient presents with  . Elevated PSA    HPI: Walter Hebert is a 69 y.o. 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  Elevated PSA MRI of prostate on 07/11/2018 noted no findings of macroscopic or high-grade prostate carcinoma.  Moderate benign prostatic hyperplasia, resulting in compression of the peripheral zone.  Bladder wall thickening, suggesting a component of outlet obstruction. Current PSA 5.8 in 08/2019. PSAD 0.084.  PSAD less than 0.15 ng/mL/mL was the most well studied and accurate negative predictive factor for clinically significant prostate cancer diagnosis.  Recommend the use of PSAD less than 0.15 ng/mL/mL along with negative MRI results to omit biopsy indication in cancer naive patients.  Predictive Factors of Missed Clinically Significant Prostate Cancers in Men with Negative Magnetic Resonance Imaging: A Systematic Review and Meta-Analysis.  Journal of Urology: Vol. 204, 24-32 July 2020.   Elevated PSA PSA Trend   8.3 in 12/2013 - bx positive for HGPIN  5.2 in 10/2014  6.3 in 06/2015  4.5 in 09/2015  4.5 in 11/2015  4.5 in 10/2017 - started finasteride  3.5 in 04/2016  4.2 in 10/2016 - stopped finasteride   4.1 in 04/2017  4.3 in 10/2017  6.2 in 04/2018   MRI negative for high grade cancer   Restart finasteride 5 mg on 07/15/2018  4.6 in 10/2018  4.1 in 01/2019 - No finasteride - stopped it two months ago  5.8 in 08/2019             Today's PSA pending  HGPIN Patient underwent prostate biopsy on 04/27/2014 for a PSA of 8.3 ng/mL and was found to have a 39 gram prostate and two cores positive for HGPIN.  Today's PSA pending.   BPH WITH LUTS  (prostate and/or bladder) IPSS score: 2/0    PVR: 135 mL   Previous score:  3/1  Major complaint(s): Nocturia x 1  Patient denies any modifying or aggravating factors.  Patient denies any gross hematuria, dysuria or suprapubic/flank pain.  Patient denies any fevers, chills, nausea or vomiting.   Patient drinks water with lemon juice, occasionally soda, and has been adding Prekese tea to his diet.  He has a family history of PCa, with his brother having had fatal prostate cancer.  IPSS    Row Name 03/17/20 0800         International Prostate Symptom Score   How often have you had the sensation of not emptying your bladder?  Not at All     How often have you had to urinate less than every two hours?  Not at All     How often have you found you stopped and started again several times when you urinated?  Not at All     How often have you found it difficult to postpone urination?  Less than 1 in 5 times     How often have you had a weak urinary stream?  Not at All     How often have you had to strain to start urination?  Not at All     How many times did you typically get up at night to urinate?  1 Time     Total IPSS Score  2  Quality of Life due to urinary symptoms   If you were to spend the rest of your life with your urinary condition just the way it is now how would you feel about that?  Delighted        Score:  1-7 Mild 8-19 Moderate 20-35 Severe  Erectile dysfunction His previous SHIM score is 16, which is mild-moderate ED.   He has been having difficulty with erections for many years.  His major complaint is in getting an erection.  His libido is preserved.  His risk factors for ED are age and BPH.  He denies any painful erections or curvatures with his erections.   He is still having having occasional spontaneous erections.  He has tried PDE5i in the past, with success.  He is not interested in pursuing treatment at this time.   PMH: Past Medical History:  Diagnosis Date  . Elevated PSA   . Enlarged prostate   . 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 Night sweats   . Synovial cyst     Surgical History: Past Surgical History:  Procedure Laterality Date  . COLONOSCOPY N/A 05/18/2015   Procedure: COLONOSCOPY;  Surgeon: Lucilla Lame, MD;  Location: [facility] ENDOSCOPY;  Service: Endoscopy;  Laterality: N/A;    Home Medications:  Allergies as of 03/17/2020      Reactions   Chloroquine Itching   Hydroxychloroquine Sulfate Hives      Medication List       Accurate as of March 17, 2020 11:59 PM. If you have any questions, ask your nurse or doctor.        multivitamin capsule Take 1 capsule by mouth daily. Reported on 12/24/2015   VITAMIN C PO Take 1 capsule daily by mouth.   VITAMIN E PO Take by mouth.       Allergies:  Allergies  Allergen Reactions  . Chloroquine Itching  . Hydroxychloroquine Sulfate Hives    Family History: Family History  Problem Relation Age of Onset  . Hyperlipidemia Other   . Prostate cancer Brother        DECEASED  . Healthy Father   . Healthy Mother   . Kidney disease Neg Hx     Social History:  reports that he has never smoked. He has never used smokeless tobacco. He reports that he does not drink alcohol or use drugs.  ROS: For pertinent review of systems please refer to history of present illness  Physical Exam: BP 134/81   Pulse 60   Ht 5\' 8"  (1.727 m)   Wt 205 lb (93 kg)   BMI 31.17 kg/m   Constitutional:  Well nourished. Alert and oriented, No acute distress. HEENT: [place] AT, mask in place.  Trachea midline, no masses. Cardiovascular: No clubbing, cyanosis, or edema. Respiratory: Normal respiratory effort, no increased work of breathing. GI: Abdomen is soft, non tender, non distended, no abdominal masses.  GU: No CVA tenderness.  No bladder fullness or masses.  Patient with circumcised phallus.  Urethral meatus is patent.  No penile discharge. No penile lesions or rashes. Scrotum without lesions, cysts, rashes  and/or edema.  Testicles are located scrotally bilaterally. No masses are appreciated in the testicles. Left and right epididymis are normal. Rectal: Patient with  normal sphincter tone. Anus and perineum without scarring or rashes. No rectal masses are appreciated. Prostate is approximately 60 + grams, no nodules are appreciated. Seminal vesicles could not be  palpated.   Skin: No rashes, bruises or suspicious lesions. Lymph: No inguinal adenopathy. Neurologic: Grossly intact, no focal deficits, moving all 4 extremities. Psychiatric: Normal mood and affect.  Laboratory Data: Urinalysis Component     Latest Ref Rng & Units 03/17/2020  Specific Gravity, UA     1.005 - 1.030 1.025  pH, UA     5.0 - 7.5 6.0  Color, UA     Yellow Yellow  Appearance Ur     Clear Clear  Leukocytes,UA     Negative Negative  Protein,UA     Negative/Trace Negative  Glucose, UA     Negative Negative  Ketones, UA     Negative Negative  RBC, UA     Negative Negative  Bilirubin, UA     Negative Negative  Urobilinogen, Ur     0.2 - 1.0 mg/dL 0.2  Nitrite, UA     Negative Negative  Microscopic Examination      See below:   Component     Latest Ref Rng & Units 03/17/2020  WBC, UA     0 - 5 /hpf 0-5  RBC     0 - 2 /hpf None seen  Epithelial Cells (non renal)     0 - 10 /hpf 0-10  Bacteria, UA     None seen/Few None seen    Lab Results  Component Value Date   WBC 3.7 (L) 09/22/2019   HGB 14.9 09/22/2019   HCT 47.1 09/22/2019   MCV 76.7 (L) 09/22/2019   PLT 177 09/22/2019   Lab Results  Component Value Date   CREATININE 1.03 09/22/2019   PSA See HPI  Lab Results  Component Value Date   HGBA1C 5.5 09/22/2019      Component Value Date/Time   CHOL 222 (H) 09/22/2019 0824   HDL 61 09/22/2019 0824   CHOLHDL 3.6 09/22/2019 0824   LDLCALC 145 (H) 09/22/2019 0824    Lab Results  Component Value Date   AST 21 09/22/2019   Lab Results  Component Value Date   ALT 18 09/22/2019   I  have reviewed the labs.  Pertinent imagingResults for TAN, CHRETIEN (MRN [id number]) as of 03/17/2020 10:17  Ref. Range 03/17/2020 08:43  Scan Result Unknown 135 ML    Assessment & Plan:    1. Elevated PSA - Today's PSA pending  2. 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  PSA at time of biopsy was 8.3 ng/mL - MRI in 06/2018 negative for findings of macroscopic/high-grade prostate carcinoma - PSA has been stable   3. BPH with LUTS - I PSS score is 2/0, it is stable - Continue conservative management, timed voidings and avoiding bladder irritants - RTC in 6 months for I PSS, exam and PSA  4. Erectile dysfunction - At this point, patient would prefer to defer pursuing medication   5. Family history of prostate cancer - Brother with fatal prostate cancer  Return in about 6 months (around 09/16/2020) for IPSS, SHIM, PSA and exam.  Zara Council, [employer]  [place] [address] [place], [place] [address] [phone]

## 2020-03-18 LAB — URINALYSIS, COMPLETE
Bilirubin, UA: NEGATIVE
Glucose, UA: NEGATIVE
Ketones, UA: NEGATIVE
Leukocytes,UA: NEGATIVE
Nitrite, UA: NEGATIVE
Protein,UA: NEGATIVE
RBC, UA: NEGATIVE
Specific Gravity, UA: 1.025 (ref 1.005–1.030)
Urobilinogen, Ur: 0.2 mg/dL (ref 0.2–1.0)
pH, UA: 6 (ref 5.0–7.5)

## 2020-03-18 LAB — MICROSCOPIC EXAMINATION
Bacteria, UA: NONE SEEN
RBC: NONE SEEN /hpf (ref 0–2)

## 2020-03-18 LAB — PSA: Prostate Specific Ag, Serum: 6 ng/mL — ABNORMAL HIGH (ref 0.0–4.0)

## 2020-09-17 ENCOUNTER — Other Ambulatory Visit: Payer: Self-pay

## 2020-09-17 DIAGNOSIS — R972 Elevated prostate specific antigen [PSA]: Secondary | ICD-10-CM

## 2020-09-20 ENCOUNTER — Other Ambulatory Visit: Payer: Self-pay

## 2020-09-20 ENCOUNTER — Other Ambulatory Visit: Payer: 59

## 2020-09-20 DIAGNOSIS — R972 Elevated prostate specific antigen [PSA]: Secondary | ICD-10-CM

## 2020-09-21 LAB — PSA: Prostate Specific Ag, Serum: 6.1 ng/mL — ABNORMAL HIGH (ref 0.0–4.0)

## 2020-09-22 ENCOUNTER — Ambulatory Visit: Payer: Self-pay | Admitting: Urology

## 2020-09-27 NOTE — Progress Notes (Signed)
09/28/2020 8:58 PM   Walter Hebert 09-20-1951 [id number]  Referring provider: Olin Hauser, DO [address] [place],  [place] [address] Chief Complaint  Patient presents with  . Benign Prostatic Hypertrophy    HPI: Walter Hebert is a 69 y.o. male who returns for a 6 month follow up of elevated PSA, HGPIN, BPH with LUTS, and erectile dysfunction.   Elevated PSA MRI of prostate on 07/11/2018 noted no findings of macroscopic or high-grade prostate carcinoma.  Moderate benign prostatic hyperplasia, resulting in compression of the peripheral zone.  Bladder wall thickening, suggesting a component of outlet obstruction. Current PSA 6.1 in 09/2020. PSAD 0.084.  PSAD less than 0.15 ng/mL/mL was the most well studied and accurate negative predictive factor for clinically significant prostate cancer diagnosis.  Recommend the use of PSAD less than 0.15 ng/mL/mL along with negative MRI results to omit biopsy indication in cancer naive patients.  Predictive Factors of Missed Clinically Significant Prostate Cancers in Men with Negative Magnetic Resonance Imaging: A Systematic Review and Meta-Analysis.  Journal of Urology: Vol. 204, 24-32 July 2020.   Elevated PSA PSA Trend              8.3 in 12/2013 - bx positive for HGPIN             5.2 in 10/2014             6.3 in 06/2015             4.5 in 09/2015             4.5 in 11/2015             4.5 in 10/2017 - started finasteride             3.5 in 04/2016             4.2 in 10/2016 - stopped finasteride              4.1 in 04/2017             4.3 in 10/2017             6.2 in 04/2018              MRI negative for high grade cancer              Restart finasteride 5 mg on 07/15/2018             4.6 in 10/2018             4.1 in 01/2019 - No finasteride - stopped it two months ago             5.8 in 08/2019   6.0 in 02/2020             6.1 in 09/2020  HGPIN Patient underwent prostate biopsy on 04/27/2014 for a PSA of 8.3 ng/mL and was  found to have a 39 gram prostate and two cores positive for HGPIN.  PSA is 6.1 as of 09/20/2020.  BPH WITH LUTS  (prostate and/or bladder) IPSS score: 3/2    Previous score: 2/0  He is doing well today. He has nocturia x 1. Reports a good stream. He has frequency on when he drink a lot of fluids. Denies hematuria or dysuria. No flank, abdominal, and pelvic pain. Patient is not taking finasteride.   He has a family history of PCa, with his brother having had fatal prostate cancer.   IPSS  [place] Name 09/28/20 0800         International Prostate Symptom Score   How often have you had the sensation of not emptying your bladder? Not at All     How often have you had to urinate less than every two hours? Less than 1 in 5 times     How often have you found you stopped and started again several times when you urinated? Not at All     How often have you found it difficult to postpone urination? Less than 1 in 5 times     How often have you had a weak urinary stream? Not at All     How often have you had to strain to start urination? Not at All     How many times did you typically get up at night to urinate? 1 Time     Total IPSS Score 3       Quality of Life due to urinary symptoms   If you were to spend the rest of your life with your urinary condition just the way it is now how would you feel about that? Mostly Satisfied            Score:  1-7 Mild 8-19 Moderate 20-35 Severe   PMH: Past Medical History:  Diagnosis Date  . Elevated PSA   . Enlarged prostate   . 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 Night sweats   . Synovial cyst     Surgical History: Past Surgical History:  Procedure Laterality Date  . COLONOSCOPY N/A 05/18/2015   Procedure: COLONOSCOPY;  Surgeon: Lucilla Lame, MD;  Location: [facility] ENDOSCOPY;  Service: Endoscopy;  Laterality: N/A;    Home Medications:  Allergies as of 09/28/2020      Reactions    Chloroquine Itching   Hydroxychloroquine Sulfate Hives      Medication List       Accurate as of September 28, 2020 11:59 PM. If you have any questions, ask your nurse or doctor.        multivitamin capsule Take 1 capsule by mouth daily. Reported on 12/24/2015   VITAMIN C PO Take 1 capsule daily by mouth.   VITAMIN E PO Take by mouth.       Allergies:  Allergies  Allergen Reactions  . Chloroquine Itching  . Hydroxychloroquine Sulfate Hives    Family History: Family History  Problem Relation Age of Onset  . Hyperlipidemia Other   . Prostate cancer Brother        DECEASED  . Healthy Father   . Healthy Mother   . Kidney disease Neg Hx     Social History:  reports that he has never smoked. He has never used smokeless tobacco. He reports that he does not drink alcohol and does not use drugs.   Physical Exam: BP 138/72   Pulse (!) 50   Ht 5\' 8"  (1.727 m)   Wt 205 lb (93 kg)   BMI 31.17 kg/m   Constitutional:  Alert and oriented, No acute distress. HEENT: [place] AT, mask in place.  Trachea midline, no masses. Cardiovascular: No clubbing, cyanosis, or edema. Respiratory: Normal respiratory effort, no increased work of breathing. GU: No CVA tenderness.  No bladder fullness or masses.  Patient with circumcised phallus. Urethral meatus is patent.  No penile discharge. No penile lesions or rashes. Scrotum without lesions, cysts, rashes and/or edema.  Testicles are located  scrotally bilaterally. No masses are appreciated in the testicles. Left and right epididymis are normal. Rectal: Patient with  normal sphincter tone. Anus and perineum without scarring or rashes. No rectal masses are appreciated. Palpated apex and mid portion. Prostate is approximately 60+ grams, no nodules are appreciated. Seminal vesicles were not palpated. Lymph: No inguinal lymphadenopathy. Neurologic: Grossly intact, no focal deficits, moving all 4 extremities. Psychiatric: Normal mood and  affect.    Assessment & Plan:   1. Elevated PSA - PSA is stable at 6.1.  2. 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  PSA at time of biopsy was 8.3 ng/mL - MRI in 06/2018 negative for findings of macroscopic/high-grade prostate carcinoma - PSA is stable  3. BPH with LUTS - IPSS score is 3/2, it is worse - Continue conservative management, avoiding bladder irritants and timed voiding's - he has not bothersome symptoms at this time - RTC in 6 months for IPSS, exam and PSA.   4. Family history of prostate cancer   - Brother with fatal prostate cancer   Return in about 6 months (around 03/29/2021) for IPSS, PSA and exam.  [facility] [address] [place], [place] [address] [phone]  I, Selena Batten, am acting as a scribe for [employer],  I have reviewed the above documentation for accuracy and completeness, and I agree with the above.    Zara Council, PA-C

## 2020-09-28 ENCOUNTER — Ambulatory Visit (INDEPENDENT_AMBULATORY_CARE_PROVIDER_SITE_OTHER): Payer: 59 | Admitting: Urology

## 2020-09-28 ENCOUNTER — Encounter: Payer: Self-pay | Admitting: Urology

## 2020-09-28 ENCOUNTER — Other Ambulatory Visit: Payer: Self-pay

## 2020-09-28 VITALS — BP 138/72 | HR 50 | Ht 68.0 in | Wt 205.0 lb

## 2020-09-28 DIAGNOSIS — R972 Elevated prostate specific antigen [PSA]: Secondary | ICD-10-CM | POA: Diagnosis not present

## 2020-09-28 DIAGNOSIS — N401 Enlarged prostate with lower urinary tract symptoms: Secondary | ICD-10-CM

## 2020-09-28 DIAGNOSIS — Z8042 Family history of malignant neoplasm of prostate: Secondary | ICD-10-CM | POA: Diagnosis not present

## 2020-09-28 DIAGNOSIS — N138 Other obstructive and reflux uropathy: Secondary | ICD-10-CM | POA: Diagnosis not present

## 2020-10-06 ENCOUNTER — Other Ambulatory Visit: Payer: Self-pay

## 2020-10-06 ENCOUNTER — Other Ambulatory Visit: Payer: 59

## 2020-10-06 DIAGNOSIS — Z0000 Encounter for general adult medical examination without abnormal findings: Secondary | ICD-10-CM

## 2020-10-06 DIAGNOSIS — E669 Obesity, unspecified: Secondary | ICD-10-CM

## 2020-10-06 DIAGNOSIS — R7309 Other abnormal glucose: Secondary | ICD-10-CM

## 2020-10-06 DIAGNOSIS — E7800 Pure hypercholesterolemia, unspecified: Secondary | ICD-10-CM

## 2020-10-06 LAB — CBC WITH DIFFERENTIAL/PLATELET
Absolute Monocytes: 398 cells/uL (ref 200–950)
RBC: 6.43 10*6/uL — ABNORMAL HIGH (ref 4.20–5.80)
Total Lymphocyte: 56 %
WBC: 4.1 10*3/uL (ref 3.8–10.8)

## 2020-10-07 LAB — HEMOGLOBIN A1C
Hgb A1c MFr Bld: 5.7 % of total Hgb — ABNORMAL HIGH (ref <5.7)
Mean Plasma Glucose: 117 (calc)
eAG (mmol/L): 6.5 (calc)

## 2020-10-07 LAB — COMPLETE METABOLIC PANEL WITH GFR
AG Ratio: 1.3 (calc) (ref 1.0–2.5)
ALT: 21 U/L (ref 9–46)
AST: 21 U/L (ref 10–35)
Albumin: 4.1 g/dL (ref 3.6–5.1)
Alkaline phosphatase (APISO): 65 U/L (ref 35–144)
BUN: 11 mg/dL (ref 7–25)
CO2: 29 mmol/L (ref 20–32)
Calcium: 9.9 mg/dL (ref 8.6–10.3)
Chloride: 104 mmol/L (ref 98–110)
Creat: 1.1 mg/dL (ref 0.70–1.25)
GFR, Est African American: 80 mL/min/{1.73_m2} (ref >=60)
GFR, Est Non African American: 69 mL/min/{1.73_m2} (ref >=60)
Globulin: 3.2 g/dL (calc) (ref 1.9–3.7)
Glucose, Bld: 86 mg/dL (ref 65–99)
Potassium: 4.8 mmol/L (ref 3.5–5.3)
Sodium: 139 mmol/L (ref 135–146)
Total Bilirubin: 0.8 mg/dL (ref 0.2–1.2)
Total Protein: 7.3 g/dL (ref 6.1–8.1)

## 2020-10-07 LAB — CBC WITH DIFFERENTIAL/PLATELET
Basophils Absolute: 41 cells/uL (ref 0–200)
Basophils Relative: 1 %
Eosinophils Absolute: 160 cells/uL (ref 15–500)
Eosinophils Relative: 3.9 %
HCT: 49.8 % (ref 38.5–50.0)
Hemoglobin: 15.5 g/dL (ref 13.2–17.1)
Lymphs Abs: 2296 cells/uL (ref 850–3900)
MCH: 24.1 pg — ABNORMAL LOW (ref 27.0–33.0)
MCHC: 31.1 g/dL — ABNORMAL LOW (ref 32.0–36.0)
MCV: 77.4 fL — ABNORMAL LOW (ref 80.0–100.0)
MPV: 11.1 fL (ref 7.5–12.5)
Monocytes Relative: 9.7 %
Neutro Abs: 1205 cells/uL — ABNORMAL LOW (ref 1500–7800)
Neutrophils Relative %: 29.4 %
Platelets: 175 10*3/uL (ref 140–400)
RDW: 15.4 % — ABNORMAL HIGH (ref 11.0–15.0)

## 2020-10-07 LAB — LIPID PANEL
Cholesterol: 239 mg/dL — ABNORMAL HIGH (ref <200)
HDL: 63 mg/dL (ref >=40)
LDL Cholesterol (Calc): 150 mg/dL (calc) — ABNORMAL HIGH
Non-HDL Cholesterol (Calc): 176 mg/dL (calc) — ABNORMAL HIGH (ref <130)
Total CHOL/HDL Ratio: 3.8 (calc) (ref <5.0)
Triglycerides: 134 mg/dL (ref <150)

## 2020-10-07 LAB — TSH: TSH: 3.86 mIU/L (ref 0.40–4.50)

## 2020-10-13 ENCOUNTER — Other Ambulatory Visit: Payer: Self-pay | Admitting: Family Medicine

## 2020-10-13 ENCOUNTER — Encounter: Payer: Self-pay | Admitting: Family Medicine

## 2020-10-13 ENCOUNTER — Ambulatory Visit (INDEPENDENT_AMBULATORY_CARE_PROVIDER_SITE_OTHER): Payer: 59 | Admitting: Family Medicine

## 2020-10-13 ENCOUNTER — Other Ambulatory Visit: Payer: Self-pay

## 2020-10-13 VITALS — BP 134/74 | HR 57 | Temp 97.6°F | Resp 16 | Ht 68.0 in | Wt 210.0 lb

## 2020-10-13 DIAGNOSIS — F5101 Primary insomnia: Secondary | ICD-10-CM

## 2020-10-13 DIAGNOSIS — R7309 Other abnormal glucose: Secondary | ICD-10-CM

## 2020-10-13 DIAGNOSIS — Z0000 Encounter for general adult medical examination without abnormal findings: Secondary | ICD-10-CM

## 2020-10-13 DIAGNOSIS — N138 Other obstructive and reflux uropathy: Secondary | ICD-10-CM

## 2020-10-13 DIAGNOSIS — E669 Obesity, unspecified: Secondary | ICD-10-CM

## 2020-10-13 DIAGNOSIS — R972 Elevated prostate specific antigen [PSA]: Secondary | ICD-10-CM

## 2020-10-13 DIAGNOSIS — Z23 Encounter for immunization: Secondary | ICD-10-CM

## 2020-10-13 DIAGNOSIS — N401 Enlarged prostate with lower urinary tract symptoms: Secondary | ICD-10-CM

## 2020-10-13 DIAGNOSIS — E7800 Pure hypercholesterolemia, unspecified: Secondary | ICD-10-CM | POA: Diagnosis not present

## 2020-10-13 NOTE — Assessment & Plan Note (Signed)
Stable but slightly elevated LDL 150 Last lipid panel 09/2020 The 10-year ASCVD risk score Walter Hebert DC Jr., et al., 2013) is: 11.3%  Plan: 1. Discussion on ASCVD risk reduction strategies - he is candidate for statin, we mutually agree to hold for now and work on lifestyle he will reconsider around age 69+ 2. Encourage improved lifestyle - low carb/cholesterol, reduce portion size,recommend exercise  F/u in 6 month lipid

## 2020-10-13 NOTE — Assessment & Plan Note (Signed)
Followed by [facility] for BPH along with elevated PSA and abnormal prostate biopsy Remains off finasteride Last PSA trend 6-6.1 F/u 6 months Urology as scheduled

## 2020-10-13 NOTE — Progress Notes (Signed)
Subjective:    Patient ID: Walter Hebert, male    DOB: 27-Oct-1951, 69 y.o.   MRN: [id number]  Walter Hebert is a 69 y.o. male presenting on 10/13/2020 for Annual Exam   HPI   Here for Annual Physical and Lab Review.  Elevated A1c /Obesity BMI >31/ Elevated BP without HTN Previous A1c 5.5 to 5.8, last lab was 5.7 BP readings have improved no regular checks but no problems. Not on medication for BP Today reports doing well on improved lifestylestill Lifestyle Weight gained 5 lbs in past 7 months. - Diet:Continues healthy diet, still admits eating snack or meal in PM avoid late night large meal before bed.Reduced starches / carbs and portions.Drinks water mostly, also warm water with lemon/lime in AM -Exercise:None regular. 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 - Previously on Finasteride and made symptoms worse. Now off medicines - Last seen by Zara Council 09/2020 and is following in 6 month for PSA Prior PSA trend 6 to 6.1  HYPERLIPIDEMIA Last lipid 09/2020, still elevated LDL now >150 - Not on any cholesterol medication at this time, declines  Chronic Low MCV Prior discussion. Long history of lab results with low MCV and normal Hgb.  Health Maintenance: -Due for Flu Shot, will receive today  Prostate CA Screening: Followed by [facility], history of elevated PSA.  Pneumonia vaccine series UTD completed  Colon CA Screening: Last Colonoscopy 05/18/15 (done by Dr [facility]), results with hyperplastic polyp benign, good for 10 years. Currently asymptomatic. No known family history of colon CA. Next due in 2026.    Depression screen [facility] 2/9 10/13/2020 10/09/2019 03/27/2019  Decreased Interest 0 0 0   Down, Depressed, Hopeless 0 0 0  PHQ - 2 Score 0 0 0  Altered sleeping - 0 -  Tired, decreased energy - 0 -  Change in appetite - 0 -  Feeling bad or failure about yourself  - 0 -  Trouble concentrating - 0 -  Moving slowly or fidgety/restless - 0 -  Suicidal thoughts - 0 -  PHQ-9 Score - 0 -  Difficult doing work/chores - - -    Past Medical History:  Diagnosis Date  . Elevated PSA   . Enlarged prostate   . 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 Night sweats   . Synovial cyst    Past Surgical History:  Procedure Laterality Date  . COLONOSCOPY N/A 05/18/2015   Procedure: COLONOSCOPY;  Surgeon: Lucilla Lame, MD;  Location: [facility] ENDOSCOPY;  Service: Endoscopy;  Laterality: N/A;   Social History   Socioeconomic History  . Marital status: Married    Spouse name: Not on file  . Number of children: Not on file  . Years of education: Not on file  . Highest education level: Not on file  Occupational History  . Not on file  Tobacco Use  . Smoking status: Never Smoker  . Smokeless tobacco: Never Used  Vaping Use  . Vaping Use: Never used  Substance and Sexual Activity  . Alcohol use: No    Comment: Quit- 1990  . Drug use: No  . Sexual activity: Not on file  Other Topics Concern  .  Not on file  Social History Narrative  . Not on file   Social Determinants of Health   Financial Resource Strain:   . Difficulty of Paying Living Expenses: Not on file  Food Insecurity:   . Worried About Charity fundraiser in the Last Year: Not on file  . Ran Out of Food in the Last Year: Not on file  Transportation Needs:   . Lack of Transportation (Medical): Not on file  . Lack of Transportation (Non-Medical): Not on file  Physical Activity:   . Days of Exercise per Week: Not on file  . Minutes of Exercise per Session: Not on file  Stress:   . Feeling of Stress : Not on file  Social Connections:   . Frequency of Communication  with Friends and Family: Not on file  . Frequency of Social Gatherings with Friends and Family: Not on file  . Attends Religious Services: Not on file  . Active Member of Clubs or Organizations: Not on file  . Attends Archivist Meetings: Not on file  . Marital Status: Not on file  Intimate Partner Violence:   . Fear of Current or Ex-Partner: Not on file  . Emotionally Abused: Not on file  . Physically Abused: Not on file  . Sexually Abused: Not on file   Family History  Problem Relation Age of Onset  . Hyperlipidemia Other   . Prostate cancer Brother        DECEASED  . Healthy Father   . Healthy Mother   . Kidney disease Neg Hx    Current Outpatient Medications on File Prior to Visit  Medication Sig  . Ascorbic Acid (VITAMIN C PO) Take 1 capsule daily by mouth.   . Multiple Vitamin (MULTIVITAMIN) capsule Take 1 capsule by mouth daily. Reported on 12/24/2015  . VITAMIN E PO Take by mouth.   No current facility-administered medications on file prior to visit.    Review of Systems Per HPI unless specifically indicated above      Objective:    BP 134/74   Pulse (!) 57   Temp 97.6 F (36.4 C) (Temporal)   Resp 16   Ht 5\' 8"  (1.727 m)   Wt 210 lb (95.3 kg)   SpO2 99%   BMI 31.93 kg/m   Wt Readings from Last 3 Encounters:  10/13/20 210 lb (95.3 kg)  09/28/20 205 lb (93 kg)  03/17/20 205 lb (93 kg)    Physical Exam Vitals and nursing note reviewed.  Constitutional:      General: He is not in acute distress.    Appearance: He is well-developed. He is not diaphoretic.     Comments: Well-appearing, comfortable, cooperative  HENT:     Head: Normocephalic and atraumatic.  Eyes:     General:        Right eye: No discharge.        Left eye: No discharge.     Conjunctiva/sclera: Conjunctivae normal.     Pupils: Pupils are equal, round, and reactive to light.  Neck:     Thyroid: No thyromegaly.     Vascular: No carotid bruit.  Cardiovascular:     Rate and  Rhythm: Normal rate and regular rhythm.     Heart sounds: Normal heart sounds. No murmur heard.   Pulmonary:     Effort: Pulmonary effort is normal. No respiratory distress.     Breath sounds: Normal breath sounds. No wheezing or rales.  Chest:  Abdominal:     General: Bowel sounds are normal. There is no distension.     Palpations: Abdomen is soft. There is no mass.     Tenderness: There is no abdominal tenderness.  Musculoskeletal:        General: No tenderness. Normal range of motion.     Cervical back: Normal range of motion and neck supple.     Right lower leg: No edema.     Left lower leg: No edema.     Comments: Upper / Lower Extremities: - Normal muscle tone, strength bilateral upper extremities 5/5, lower extremities 5/5  Lymphadenopathy:     Cervical: No cervical adenopathy.     Right cervical: No superficial, deep or posterior cervical adenopathy.    Left cervical: No deep or posterior cervical adenopathy.     Upper Body:     Right upper body: No supraclavicular, axillary or pectoral adenopathy.     Left upper body: No supraclavicular, axillary or pectoral adenopathy.  Skin:    General: Skin is warm and dry.     Findings: No erythema or rash.  Neurological:     Mental Status: He is alert and oriented to person, place, and time.     Comments: Distal sensation intact to light touch all extremities  Psychiatric:        Behavior: Behavior normal.     Comments: Well groomed, good eye contact, normal speech and thoughts    Results for orders placed or performed in visit on 10/06/20  TSH  Result Value Ref Range   TSH 3.86 0.40 - 4.50 mIU/L  Lipid panel  Result Value Ref Range   Cholesterol 239 (H) <200 mg/dL   HDL 63 > OR = 40 mg/dL   Triglycerides 134 <150 mg/dL   LDL Cholesterol (Calc) 150 (H) mg/dL (calc)   Total CHOL/HDL Ratio 3.8 <5.0 (calc)   Non-HDL Cholesterol (Calc) 176 (H) <130 mg/dL (calc)  COMPLETE METABOLIC PANEL WITH GFR  Result Value Ref Range     Glucose, Bld 86 65 - 99 mg/dL   BUN 11 7 - 25 mg/dL   Creat 1.10 0.70 - 1.25 mg/dL   GFR, Est Non African American 69 > OR = 60 mL/min/1.22m2   GFR, Est African American 80 > OR = 60 mL/min/1.28m2   BUN/Creatinine Ratio NOT APPLICABLE 6 - 22 (calc)   Sodium 139 135 - 146 mmol/L   Potassium 4.8 3.5 - 5.3 mmol/L   Chloride 104 98 - 110 mmol/L   CO2 29 20 - 32 mmol/L   Calcium 9.9 8.6 - 10.3 mg/dL   Total Protein 7.3 6.1 - 8.1 g/dL   Albumin 4.1 3.6 - 5.1 g/dL   Globulin 3.2 1.9 - 3.7 g/dL (calc)   AG Ratio 1.3 1.0 - 2.5 (calc)   Total Bilirubin 0.8 0.2 - 1.2 mg/dL   Alkaline phosphatase (APISO) 65 35 - 144 U/L   AST 21 10 - 35 U/L   ALT 21 9 - 46 U/L  CBC with Differential/Platelet  Result Value Ref Range   WBC 4.1 3.8 - 10.8 Thousand/uL   RBC 6.43 (H) 4.20 - 5.80 Million/uL   Hemoglobin 15.5 13.2 - 17.1 g/dL   HCT 49.8 38 - 50 %   MCV 77.4 (L) 80.0 - 100.0 fL   MCH 24.1 (L) 27.0 - 33.0 pg   MCHC 31.1 (L) 32.0 - 36.0 g/dL   RDW 15.4 (H) 11.0 - 15.0 %   Platelets 175 140 - 400  Thousand/uL   MPV 11.1 7.5 - 12.5 fL   Neutro Abs 1,205 (L) 1,500 - 7,800 cells/uL   Lymphs Abs 2,296 850 - 3,900 cells/uL   Absolute Monocytes 398 200 - 950 cells/uL   Eosinophils Absolute 160 15.0 - 500.0 cells/uL   Basophils Absolute 41 0.0 - 200.0 cells/uL   Neutrophils Relative % 29.4 %   Total Lymphocyte 56.0 %   Monocytes Relative 9.7 %   Eosinophils Relative 3.9 %   Basophils Relative 1.0 %  Hemoglobin A1c  Result Value Ref Range   Hgb A1c MFr Bld 5.7 (H) <5.7 % of total Hgb   Mean Plasma Glucose 117 (calc)   eAG (mmol/L) 6.5 (calc)      Assessment & Plan:   Problem List Items Addressed This Visit    Obesity (BMI 30.0-34.9)    Mild wt gain but overall remains improved healthy lifestyle Keep working on lifestyle wt loss Counseling on diet exercise      Insomnia   Hyperlipidemia    Stable but slightly elevated LDL 150 Last lipid panel 09/2020 The 10-year ASCVD risk score Mikey Bussing  DC Jr., et al., 2013) is: 11.3%  Plan: 1. Discussion on ASCVD risk reduction strategies - he is candidate for statin, we mutually agree to hold for now and work on lifestyle he will reconsider around age 32+ 2. Encourage improved lifestyle - low carb/cholesterol, reduce portion size,recommend exercise  F/u in 6 month lipid      Elevated PSA    Followed by [facility] for BPH along with elevated PSA and abnormal prostate biopsy Remains off finasteride Last PSA trend 6-6.1 F/u 6 months Urology as scheduled      Elevated hemoglobin A1c    Last A1c up to 5.7, prior range 5.5 to 5.8 Concern with obesity, HLD  Plan:  1. Not on any therapy currently  2. Encourage improved lifestyle - low carb, low sugar diet, reduce portion size, continue improving regular exercise  F/u 6 month A1c      BPH with obstruction/lower urinary tract symptoms    Followed by [facility] for BPH along with elevated PSA and abnormal prostate biopsy Remains off finasteride Last PSA trend 6-6.1 F/u 6 months Urology as scheduled       Other Visit Diagnoses    Annual physical exam    -  Primary   Needs flu shot       Relevant Orders   Flu Vaccine QUAD High Dose(Fluad) (Completed)      Updated Health Maintenance information - Flu shot today - Next colonoscopy 2026 ,in 5 years Reviewed recent lab results with patient Encouraged improvement to lifestyle with diet and exercise - Goal of weight loss  #Rib spot, R central area See exam, patient brings this area of concern up during exam, he said onset few days only - seems to be non tender not bothering him. Not consistent with LAD or mass or any other concerning pathology. Exam is most likely consistent with slightly elevated or raised rib/cartilage - will closely follow and re-check in 6 months   No orders of the defined types were placed in this encounter.    Follow up plan: Return in about 6 months (around 04/13/2021) for 6 month fasting lab only then 1 week later  Follow-up HLD, PreDM, BP.   Future lab in 6 months A1c, Lipid  Nobie Putnam, DO [facility] 10/13/2020, 8:33 AM

## 2020-10-13 NOTE — Patient Instructions (Addendum)
Thank you for coming to the office today.  Keep a close watch on that area of chest, I think it is the rib. But if it changes in size or pain or something different let me know sooner.  Mild elevated Cholesterol and Sugar today - goal to improve diet and exercise and limit late night snack, reduce carb starch in diet. Keep up good work on improving this.  Re-check in 6 month  DUE for FASTING BLOOD WORK (no food or drink after midnight before the lab appointment, only water or coffee without cream/sugar on the morning of)  SCHEDULE "Lab Only" visit in the morning at the clinic for lab draw in 6 MONTHS   - Make sure Lab Only appointment is at about 1 week before your next appointment, so that results will be available  For Lab Results, once available within 2-3 days of blood draw, you can can log in to MyChart online to view your results and a brief explanation. Also, we can discuss results at next follow-up visit.   Please schedule a Follow-up Appointment to: Return in about 6 months (around 04/13/2021) for 6 month fasting lab only then 1 week later Follow-up HLD, PreDM, BP.  If you have any other questions or concerns, please feel free to call the office or send a message through [facility]. You may also schedule an earlier appointment if necessary.  Additionally, you may be receiving a survey about your experience at our office within a few days to 1 week by e-mail or mail. We value your feedback.  Nobie Putnam, DO [facility]

## 2020-10-13 NOTE — Assessment & Plan Note (Signed)
Mild wt gain but overall remains improved healthy lifestyle Keep working on lifestyle wt loss Counseling on diet exercise

## 2020-10-13 NOTE — Assessment & Plan Note (Addendum)
Followed by [facility] for BPH along with elevated PSA and abnormal prostate biopsy Remains off finasteride Last PSA trend 6-6.1 F/u 6 months Urology as scheduled

## 2020-10-13 NOTE — Assessment & Plan Note (Signed)
Last A1c up to 5.7, prior range 5.5 to 5.8 Concern with obesity, HLD  Plan:  1. Not on any therapy currently  2. Encourage improved lifestyle - low carb, low sugar diet, reduce portion size, continue improving regular exercise  F/u 6 month A1c

## 2021-03-28 ENCOUNTER — Other Ambulatory Visit: Payer: Self-pay

## 2021-03-28 ENCOUNTER — Other Ambulatory Visit: Payer: 59

## 2021-03-28 DIAGNOSIS — R972 Elevated prostate specific antigen [PSA]: Secondary | ICD-10-CM

## 2021-03-29 LAB — PSA: Prostate Specific Ag, Serum: 6.3 ng/mL — ABNORMAL HIGH (ref 0.0–4.0)

## 2021-03-30 NOTE — Progress Notes (Signed)
09/28/2020 9:36 AM   Walter Hebert [birth year] [id number]  Referring provider: Olin Hauser, DO [address] [place],  [place] [address] Chief Complaint  Patient presents with  . Benign Prostatic Hypertrophy   Urological history: 1. Elevated PSA -PSA trend  8.3 in 12/2013 - bx positive for HGPIN             5.2 in 10/2014             6.3 in 06/2015             4.5 in 09/2015             4.5 in 11/2015             4.5 in 10/2017 - started finasteride             3.5 in 04/2016             4.2 in 10/2016 - stopped finasteride              4.1 in 04/2017             4.3 in 10/2017             6.2 in 04/2018              MRI negative for high grade cancer              Restart finasteride 5 mg on 07/15/2018             4.6 in 10/2018             4.1 in 01/2019 - No finasteride - stopped it two months ago             5.8 in 08/2019   6.0 in 02/2020             6.1 in 09/2020  6.3 in 03/2021  MRI of prostate on 07/11/2018 noted no findings of macroscopic or high-grade prostate carcinoma.  Prostate volume 69 cc.  Moderate benign prostatic hyperplasia, resulting in compression of the peripheral zone.  Bladder wall thickening, suggesting a component of outlet obstruction.  PSAD 0.084.    2. Family history of prostate cancer -brother with fatal prostate cancer  3. BPH with LU TS -I PSS 3/1 -PVR 95 mL   HPI: Walter Hebert is a 70 y.o. male who returns for a 6 month follow up.    His urinary complaint today is some mild urinary urgency that happens on occasion.  It is not bothersome to him at this time.  Patient denies any modifying or aggravating factors.  Patient denies any gross hematuria, dysuria or suprapubic/flank pain.  Patient denies any fevers, chills, nausea or vomiting.   He is also noted a weakening in his erections over the last 2 months.  Patient is not having spontaneous erections.   He denies any pain or curvature with erections.     IPSS    Row Name  03/31/21 0900         International Prostate Symptom Score   How often have you had the sensation of not emptying your bladder? Not at All     How often have you had to urinate less than every two hours? Less than 1 in 5 times     How often have you found you stopped and started again several times when you urinated? Not at All     How often have you found it  difficult to postpone urination? Less than 1 in 5 times     How often have you had a weak urinary stream? Not at All     How often have you had to strain to start urination? Not at All     How many times did you typically get up at night to urinate? 1 Time     Total IPSS Score 3           Quality of Life due to urinary symptoms   If you were to spend the rest of your life with your urinary condition just the way it is now how would you feel about that? Pleased            Score:  1-7 Mild 8-19 Moderate 20-35 Severe   PMH: Past Medical History:  Diagnosis Date  . Elevated PSA   . Enlarged prostate   . 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 Night sweats   . Synovial cyst     Surgical History: Past Surgical History:  Procedure Laterality Date  . COLONOSCOPY N/A 05/18/2015   Procedure: COLONOSCOPY;  Surgeon: Lucilla Lame, MD;  Location: [facility] ENDOSCOPY;  Service: Endoscopy;  Laterality: N/A;    Home Medications:  Allergies as of 03/31/2021      Reactions   Chloroquine Itching   Hydroxychloroquine Sulfate Hives      Medication List       Accurate as of March 31, 2021  9:36 AM. If you have any questions, ask your nurse or doctor.        multivitamin capsule Take 1 capsule by mouth daily. Reported on 12/24/2015   sildenafil 100 MG tablet Commonly known as: VIAGRA Take 1 tablet (100 mg total) by mouth daily as needed for erectile dysfunction. Take two hours prior to intercourse on an empty stomach Started by: Zara Council, PA-C   VITAMIN C PO Take 1  capsule daily by mouth.   VITAMIN E PO Take by mouth.       Allergies:  Allergies  Allergen Reactions  . Chloroquine Itching  . Hydroxychloroquine Sulfate Hives    Family History: Family History  Problem Relation Age of Onset  . Hyperlipidemia Other   . Prostate cancer Brother        DECEASED  . Healthy Father   . Healthy Mother   . Kidney disease Neg Hx     Social History:  reports that he has never smoked. He has never used smokeless tobacco. He reports that he does not drink alcohol and does not use drugs.   Physical Exam: BP (!) 170/83   Pulse (!) 58   Ht 5\' 8"  (1.727 m)   Wt 210 lb (95.3 kg)   BMI 31.93 kg/m   Constitutional:  Well nourished. Alert and oriented, No acute distress. HEENT:  AT, mask in place.  Trachea midline Cardiovascular: No clubbing, cyanosis, or edema. Respiratory: Normal respiratory effort, no increased work of breathing. GU: No CVA tenderness.  No bladder fullness or masses.  Patient with circumcised phallus.  Urethral meatus is patent.  No penile discharge. No penile lesions or rashes. Scrotum without lesions, cysts, rashes and/or edema.  Testicles are located scrotally bilaterally. No masses are appreciated in the testicles. Left and right epididymis are normal. Rectal: Patient with  normal sphincter tone. Anus and perineum without scarring or rashes. No rectal masses are appreciated. Prostate is approximately 60 + grams, could only  palpate apex and midportion of the gland, no nodules are appreciated. Seminal vesicles could not be palpated.   Lymph: No inguinal adenopathy. Neurologic: Grossly intact, no focal deficits, moving all 4 extremities. Psychiatric: Normal mood and affect.  Pertinent Imaging Results for SUDEEP, SCHEIBEL (MRN [id number]) as of 03/31/2021 09:36  Ref. Range 03/31/2021 09:01  Scan Result Unknown 95     Assessment & Plan:   1. Elevated PSA - PSA is stable at 6.3  2. BPH with LUTS -Continue conservative management,  avoiding bladder irritants and timed voiding's -We discussed either starting a medication or undergoing cystoscopic examination to evaluate for BOO, but patient deferred at this time stating his urgency is not bothersome to him at this time  3. Family history of prostate cancer   -Brother with fatal prostate cancer   4. ED -prescribed sildenafil 100 mg, on-demand-dosing  Return in about 6 months (around 09/30/2021) for IPSS, PSA and exam.  [facility] [address], [place], [place] [address] [phone] Zara Council, PA-C

## 2021-03-31 ENCOUNTER — Ambulatory Visit (INDEPENDENT_AMBULATORY_CARE_PROVIDER_SITE_OTHER): Payer: 59 | Admitting: Urology

## 2021-03-31 ENCOUNTER — Encounter: Payer: Self-pay | Admitting: Urology

## 2021-03-31 ENCOUNTER — Other Ambulatory Visit: Payer: Self-pay

## 2021-03-31 VITALS — BP 170/83 | HR 58 | Ht 68.0 in | Wt 210.0 lb

## 2021-03-31 DIAGNOSIS — N401 Enlarged prostate with lower urinary tract symptoms: Secondary | ICD-10-CM | POA: Diagnosis not present

## 2021-03-31 DIAGNOSIS — R972 Elevated prostate specific antigen [PSA]: Secondary | ICD-10-CM

## 2021-03-31 DIAGNOSIS — N529 Male erectile dysfunction, unspecified: Secondary | ICD-10-CM | POA: Diagnosis not present

## 2021-03-31 DIAGNOSIS — N138 Other obstructive and reflux uropathy: Secondary | ICD-10-CM

## 2021-03-31 LAB — BLADDER SCAN AMB NON-IMAGING: Scan Result: 95

## 2021-03-31 MED ORDER — SILDENAFIL CITRATE 100 MG PO TABS: 100.0000 mg | ORAL_TABLET | Freq: Every day | ORAL | 12 refills | Status: AC | PRN

## 2021-04-13 ENCOUNTER — Ambulatory Visit (INDEPENDENT_AMBULATORY_CARE_PROVIDER_SITE_OTHER): Payer: 59 | Admitting: Family Medicine

## 2021-04-13 ENCOUNTER — Encounter: Payer: Self-pay | Admitting: Family Medicine

## 2021-04-13 ENCOUNTER — Other Ambulatory Visit: Payer: Self-pay | Admitting: Family Medicine

## 2021-04-13 ENCOUNTER — Other Ambulatory Visit: Payer: Self-pay

## 2021-04-13 VITALS — BP 137/87 | HR 54 | Temp 98.5°F | Ht 68.0 in | Wt 209.0 lb

## 2021-04-13 DIAGNOSIS — R7309 Other abnormal glucose: Secondary | ICD-10-CM

## 2021-04-13 DIAGNOSIS — E7800 Pure hypercholesterolemia, unspecified: Secondary | ICD-10-CM

## 2021-04-13 DIAGNOSIS — N401 Enlarged prostate with lower urinary tract symptoms: Secondary | ICD-10-CM | POA: Diagnosis not present

## 2021-04-13 DIAGNOSIS — R972 Elevated prostate specific antigen [PSA]: Secondary | ICD-10-CM | POA: Diagnosis not present

## 2021-04-13 DIAGNOSIS — E669 Obesity, unspecified: Secondary | ICD-10-CM

## 2021-04-13 DIAGNOSIS — Z0000 Encounter for general adult medical examination without abnormal findings: Secondary | ICD-10-CM

## 2021-04-13 DIAGNOSIS — N138 Other obstructive and reflux uropathy: Secondary | ICD-10-CM

## 2021-04-13 DIAGNOSIS — R030 Elevated blood-pressure reading, without diagnosis of hypertension: Secondary | ICD-10-CM

## 2021-04-13 NOTE — Assessment & Plan Note (Signed)
Last A1c up to 5.7, prior range 5.5 to 5.8 Concern with obesity, HLD  Plan:  Check A1c 1. Not on any therapy currently  2. Encourage improved lifestyle - low carb, low sugar diet, reduce portion size, continue improving regular exercise

## 2021-04-13 NOTE — Assessment & Plan Note (Signed)
Followed by [facility] for BPH along with elevated PSA and abnormal prostate biopsy Remains off finasteride  No longer on medication On Sildenafil PDE5 PRN

## 2021-04-13 NOTE — Assessment & Plan Note (Signed)
Followed by [facility] for BPH along with elevated PSA and abnormal prostate biopsy Remains off finasteride Last PSA trend 6-6.1 - last result 6.3 (03/2021) stable

## 2021-04-13 NOTE — Progress Notes (Signed)
Subjective:    Patient ID: Walter Hebert, male    DOB: 1951-09-06, 70 y.o.   MRN: [id number]  Walter Hebert is a 70 y.o. male presenting on 04/13/2021 for Hyperlipidemia, Hypertension, and Prediabetes   HPI   Elevated A1c /Obesity BMI >31/ Elevated BP without HTN Previous A1c 5.5 to 5.8, last lab was 5.7 BP readings have improved no regular checks but no problems. Not on medication for BP Today reports doing well on improved lifestylestill Lifestyle Weight down 1 lb in 6 months - Diet:Continues healthy diet, mostly stable diet from last time, trying to limit PM meal, reduced portions.Drinks water mostly, also warm water with lemon/lime in AM -Exercise:Regular activity. No other additional exercise. Exercise machine rowing. 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 - Previously on Finasteride and made symptoms worse. Now off medicines - Last seen by Zara Council 03/2021 Prior PSA trend 6 to 6.1, last result 6.3 (03/2021)  HYPERLIPIDEMIA Last lipid 09/2020, still elevated LDL now >150 - Not on any cholesterol medication at this time, declines  Chronic Low MCV Prior discussion. Long history of lab results with low MCV and normal Hgb.  Additional history  Sinus Congestion / Allergies  Elevated Temperature w/ some night sweats rarely occurring, randomly. Admits some allergy symptoms related. He reports taking Alka seltzer with resolution PRN. No cough, hemoptysis nausea vomiting  loss of appetite unintentional weight loss  Health Maintenance:  COVID19 Vaccine Moderna last dose 07/2020, completed initial series  Prostate CA Screening: Followed by [facility], history of elevated PSA.  Pneumonia vaccine series UTD completed  Colon CA Screening: Last Colonoscopy5/31/16(done by Dr Valda Favia), results withhyperplastic polyp benign, good for10years. Currently asymptomatic.  No known family history of colon CA. Next due in 2026   Depression screen [facility] 2/9 04/13/2021 10/13/2020 10/09/2019  Decreased Interest 0 0 0  Down, Depressed, Hopeless 0 0 0  PHQ - 2 Score 0 0 0  Altered sleeping - - 0  Tired, decreased energy - - 0  Change in appetite - - 0  Feeling bad or failure about yourself  - - 0  Trouble concentrating - - 0  Moving slowly or fidgety/restless - - 0  Suicidal thoughts - - 0  PHQ-9 Score - - 0  Difficult doing work/chores - - -    Social History   Tobacco Use  . Smoking status: Never Smoker  . Smokeless tobacco: Never Used  Vaping Use  . Vaping Use: Never used  Substance Use Topics  . Alcohol use: No    Comment: Quit- 1990  . Drug use: No    Review of Systems Per HPI unless specifically indicated above     Objective:    BP 137/87   Pulse (!) 54   Temp 98.5 F (36.9 C) (Oral)   Ht 5\' 8"  (1.727 m)   Wt 209 lb (94.8 kg)   SpO2 100%   BMI 31.78 kg/m   Wt Readings from Last 3 Encounters:  04/13/21 209 lb (94.8 kg)  03/31/21 210 lb (95.3 kg)  10/13/20 210 lb (95.3 kg)    Physical Exam Vitals and nursing note reviewed.  Constitutional:      General: He is not in acute distress.    Appearance: He is well-developed. He is not diaphoretic.     Comments: Well-appearing, comfortable, cooperative  HENT:     Head: Normocephalic and atraumatic.  Eyes:  General:        Right eye: No discharge.        Left eye: No discharge.     Conjunctiva/sclera: Conjunctivae normal.  Neck:     Thyroid: No thyromegaly.  Cardiovascular:     Rate and Rhythm: Normal rate and regular rhythm.     Heart sounds: Normal heart sounds. No murmur heard.   Pulmonary:     Effort: Pulmonary effort is normal. No respiratory distress.     Breath sounds: Normal breath sounds. No wheezing or rales.  Musculoskeletal:        General: Normal range of motion.     Cervical back: Normal range of motion and neck supple.  Lymphadenopathy:     Cervical: No  cervical adenopathy.  Skin:    General: Skin is warm and dry.     Findings: No erythema or rash.  Neurological:     Mental Status: He is alert and oriented to person, place, and time.  Psychiatric:        Behavior: Behavior normal.     Comments: Well groomed, good eye contact, normal speech and thoughts      Recent Labs    10/06/20 0746  HGBA1C 5.7*    Results for orders placed or performed in visit on 03/31/21  Bladder Scan (Post Void Residual) in office  Result Value Ref Range   Scan Result 95       Assessment & Plan:   Problem List Items Addressed This Visit    Obesity (BMI 30.0-34.9)    Wt loss Encourage lifestyle diet exercise      Hyperlipidemia    Previously mild elevated LDL Last lipid panel 09/2020 The 10-year ASCVD risk score Mikey Bussing DC Jr., et al., 2013) is: 11.8%  Plan: 1. Check fasting lipid panel today - Previous discussion on ASCVD risk reduction strategies - he is candidate for statin, we mutually agree to hold for now and work on lifestyle he will reconsider around age 8+ 2. Encourage improved lifestyle - low carb/cholesterol, reduce portion size,recommend exercise      Relevant Orders   Lipid panel   Elevated PSA    Followed by [facility] for BPH along with elevated PSA and abnormal prostate biopsy Remains off finasteride Last PSA trend 6-6.1 - last result 6.3 (03/2021) stable      Elevated hemoglobin A1c - Primary    Last A1c up to 5.7, prior range 5.5 to 5.8 Concern with obesity, HLD  Plan:  Check A1c 1. Not on any therapy currently  2. Encourage improved lifestyle - low carb, low sugar diet, reduce portion size, continue improving regular exercise      Relevant Orders   Hemoglobin A1c   BPH with obstruction/lower urinary tract symptoms    Followed by [facility] for BPH along with elevated PSA and abnormal prostate biopsy Remains off finasteride  No longer on medication On Sildenafil PDE5 PRN       Other Visit Diagnoses    Elevated BP  without diagnosis of hypertension       Within range. Monitoring BP. Asymptomatic. Follow-up      No orders of the defined types were placed in this encounter.   Orders Placed This Encounter  Procedures  . Hemoglobin A1c  . Lipid panel    Order Specific Question:   Has the patient fasted?    Answer:   Yes     Follow up plan: Return in about 6 months (around 10/13/2021) for 6 month fasting  lab only then 1 week later Annual Physical.  Future labs ordered for 10/10/21  Nobie Putnam, [facility] 04/13/2021, 8:07 AM

## 2021-04-13 NOTE — Patient Instructions (Addendum)
Thank you for coming to the office today.  Keep up the good work !  May have some hay fever symptoms - can try Claritin / Loratadine OTC 10mg  daily to help reduce allergy symptoms can help.  If you have a sweat at night occasionally, may be due to temperature, can take Alka seltzer or Tylenol as needed, if this becomes more frequent or persistent or other symptoms, follow-up with me.  DUE for FASTING BLOOD WORK (no food or drink after midnight before the lab appointment, only water or coffee without cream/sugar on the morning of)  SCHEDULE "Lab Only" visit in the morning at the clinic for lab draw in 6 MONTHS   - Make sure Lab Only appointment is at about 1 week before your next appointment, so that results will be available  For Lab Results, once available within 2-3 days of blood draw, you can can log in to MyChart online to view your results and a brief explanation. Also, we can discuss results at next follow-up visit.   Please schedule a Follow-up Appointment to: Return in about 6 months (around 10/13/2021) for 6 month fasting lab only then 1 week later Annual Physical.  If you have any other questions or concerns, please feel free to call the office or send a message through [place]. You may also schedule an earlier appointment if necessary.  Additionally, you may be receiving a survey about your experience at our office within a few days to 1 week by e-mail or mail. We value your feedback.  Nobie Putnam, DO [place]

## 2021-04-13 NOTE — Assessment & Plan Note (Signed)
Wt loss Encourage lifestyle diet exercise

## 2021-04-13 NOTE — Assessment & Plan Note (Signed)
Previously mild elevated LDL Last lipid panel 09/2020 The 10-year ASCVD risk score Mikey Bussing DC Jr., et al., 2013) is: 11.8%  Plan: 1. Check fasting lipid panel today - Previous discussion on ASCVD risk reduction strategies - he is candidate for statin, we mutually agree to hold for now and work on lifestyle he will reconsider around age 68+ 2. Encourage improved lifestyle - low carb/cholesterol, reduce portion size,recommend exercise

## 2021-04-14 LAB — HEMOGLOBIN A1C
Hgb A1c MFr Bld: 5.7 % of total Hgb — ABNORMAL HIGH (ref <5.7)
Mean Plasma Glucose: 117 mg/dL
eAG (mmol/L): 6.5 mmol/L

## 2021-04-14 LAB — LIPID PANEL
Cholesterol: 229 mg/dL — ABNORMAL HIGH (ref <200)
HDL: 72 mg/dL (ref >=40)
LDL Cholesterol (Calc): 142 mg/dL (calc) — ABNORMAL HIGH
Non-HDL Cholesterol (Calc): 157 mg/dL (calc) — ABNORMAL HIGH (ref <130)
Total CHOL/HDL Ratio: 3.2 (calc) (ref <5.0)
Triglycerides: 63 mg/dL (ref <150)

## 2021-09-13 ENCOUNTER — Emergency Department: Payer: 59

## 2021-09-13 ENCOUNTER — Emergency Department
Admission: EM | Admit: 2021-09-13 | Discharge: 2021-09-13 | Disposition: A | Discharge status: Home / Self Care | Payer: 59 | Attending: Emergency Medicine | Admitting: Emergency Medicine

## 2021-09-13 ENCOUNTER — Other Ambulatory Visit: Payer: Self-pay

## 2021-09-13 DIAGNOSIS — W268XXA Contact with other sharp object(s), not elsewhere classified, initial encounter: Secondary | ICD-10-CM | POA: Insufficient documentation

## 2021-09-13 DIAGNOSIS — S61112A Laceration without foreign body of left thumb with damage to nail, initial encounter: Secondary | ICD-10-CM

## 2021-09-13 DIAGNOSIS — S62522A Displaced fracture of distal phalanx of left thumb, initial encounter for closed fracture: Secondary | ICD-10-CM | POA: Diagnosis not present

## 2021-09-13 DIAGNOSIS — S6992XA Unspecified injury of left wrist, hand and finger(s), initial encounter: Secondary | ICD-10-CM | POA: Diagnosis present

## 2021-09-13 DIAGNOSIS — Z23 Encounter for immunization: Secondary | ICD-10-CM | POA: Diagnosis not present

## 2021-09-13 DIAGNOSIS — S62522B Displaced fracture of distal phalanx of left thumb, initial encounter for open fracture: Secondary | ICD-10-CM

## 2021-09-13 IMAGING — CR DG FINGER THUMB 2+V*L*
3 series · 3 of 3 positions shown · non-contrast
Comparison: None.

CLINICAL DATA: Left thumb laceration

EXAM:
LEFT THUMB 2+V

[finger ap]
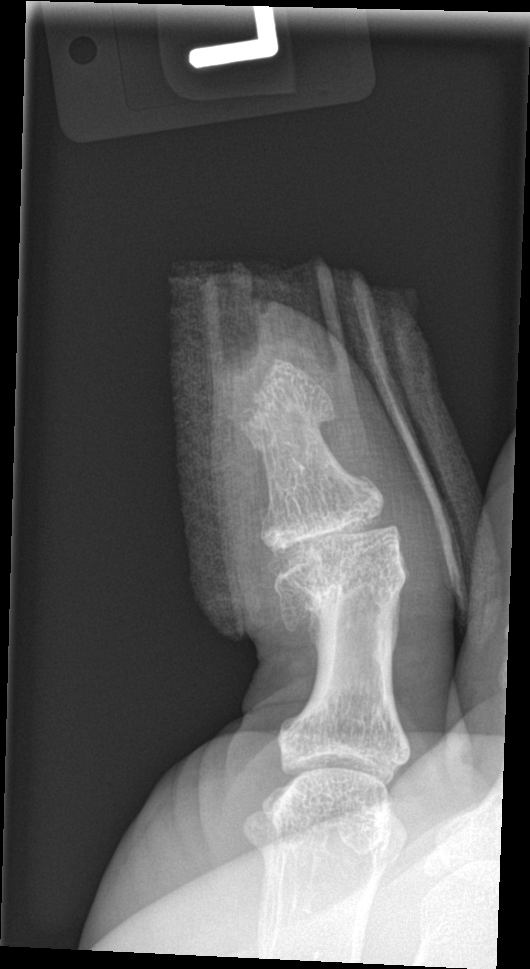

[finger obl]
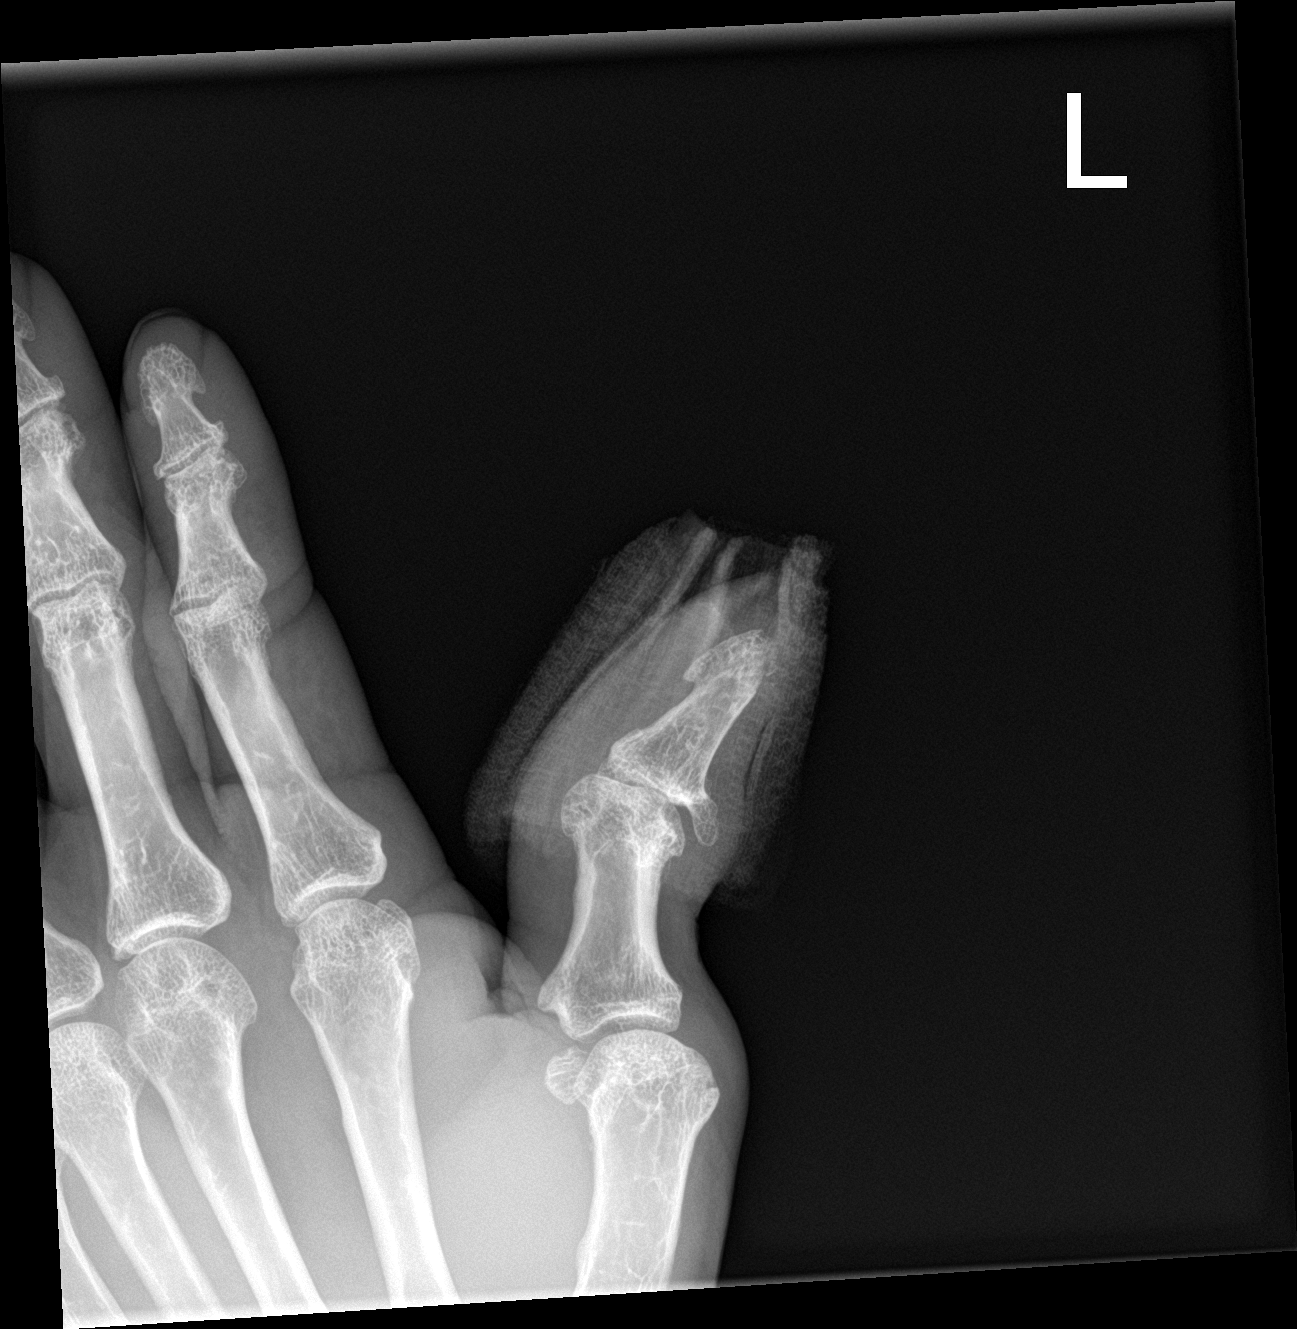

[finger lat]
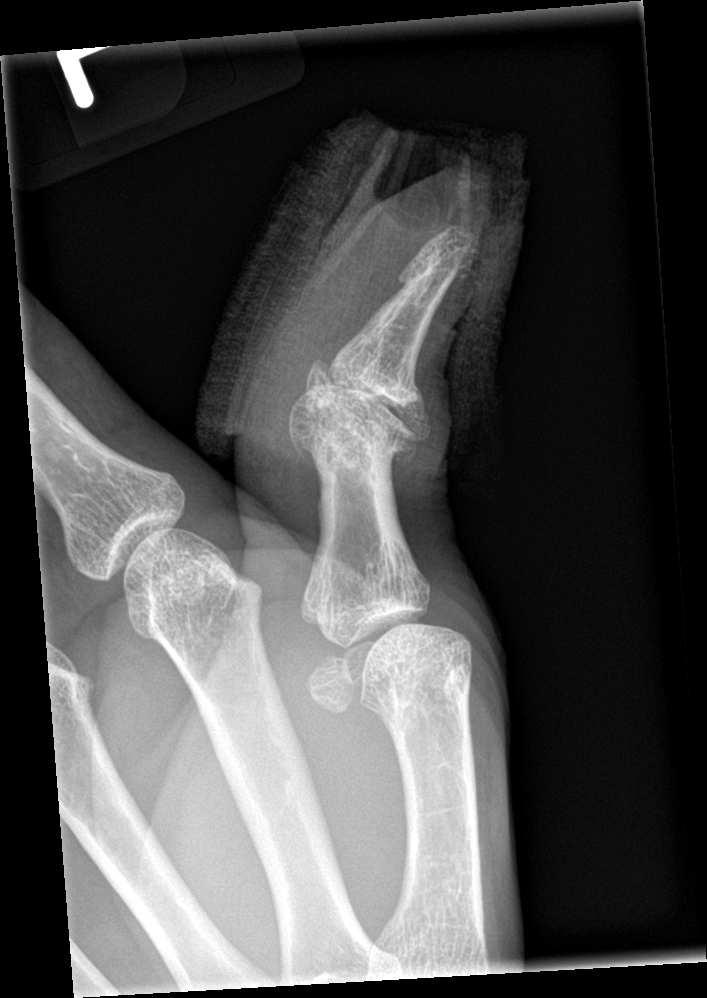

[3 of 3 positions shown; findings below may reference images not displayed]

FINDINGS: Frontal, oblique, lateral views of the left first digit demonstrate
amputation of the distal tuft of the first distal phalanx and
overlying soft tissues. There is severe osteoarthritis of the first
interphalangeal joint. Mild osteoarthritis of the first
metacarpophalangeal joint.
IMPRESSION: 1. Amputation of the distal tuft of the first distal phalanx and
overlying soft tissues.
2. Osteoarthritis.

## 2021-09-13 MED ORDER — HYDROCODONE-ACETAMINOPHEN 5-325 MG PO TABS: 1.0000 | ORAL_TABLET | ORAL | 0 refills | Status: DC | PRN

## 2021-09-13 MED ORDER — SULFAMETHOXAZOLE-TRIMETHOPRIM 800-160 MG PO TABS: 1.0000 | ORAL_TABLET | Freq: Once | ORAL | Status: DC

## 2021-09-13 MED ORDER — CEPHALEXIN 500 MG PO CAPS: 500.0000 mg | ORAL_CAPSULE | Freq: Four times a day (QID) | ORAL | 0 refills | Status: AC

## 2021-09-13 MED ORDER — CEPHALEXIN 500 MG PO CAPS
500.0000 mg | ORAL_CAPSULE | Freq: Once | ORAL | Status: AC
  Administered 2021-09-13: 500 mg via ORAL
  Filled 2021-09-13: qty 1

## 2021-09-13 MED ORDER — LIDOCAINE HCL (PF) 1 % IJ SOLN
5.0000 mL | Freq: Once | INTRAMUSCULAR | Status: DC
  Filled 2021-09-13: qty 5

## 2021-09-13 MED ORDER — TETANUS-DIPHTH-ACELL PERTUSSIS 5-2.5-18.5 LF-MCG/0.5 IM SUSY
0.5000 mL | PREFILLED_SYRINGE | Freq: Once | INTRAMUSCULAR | Status: AC
  Administered 2021-09-13: 0.5 mL via INTRAMUSCULAR
  Filled 2021-09-13: qty 0.5

## 2021-09-13 NOTE — ED Provider Notes (Signed)
Emergency Medicine Provider Triage Evaluation Note  Samantha A Gaines , a 70 y.o. male  was evaluated in triage.  Pt complains of left thumb avulsion from a saw.  Laceration involves fingernail.  Patient cannot recall his last tetanus shot.  Review of Systems  Positive: Patient has left thumb laceration. Negative: No numbness or tingling in the left hand.  Physical Exam  BP (!) 186/83 (BP Location: Right Arm)   Pulse (!) 57   Temp 98.2 F (36.8 C) (Oral)   Resp 16   SpO2 99%  Gen:   Awake, no distress   Resp:  Normal effort  MSK:   Range of motion at the left thumb difficult to assess due to pain and bleeding. Other:             Patient has left thumb laceration. Medical Decision Making  Medically screening exam initiated at 6:49 PM.  Appropriate orders placed.  Jeramey A Scrima was informed that the remainder of the evaluation will be completed by another provider, this initial triage assessment does not replace that evaluation, and the importance of remaining in the ED until their evaluation is complete.  Assessment and plan Laceration Patient has left thumb avulsion laceration.  Will obtain x-ray of left thumb.  Dressing placed in triage.  Patient is appropriate for flex.   Vallarie Mare [place], PA-C 09/13/21 1851    Delman Kitten, MD 09/13/21 [phone]

## 2021-09-13 NOTE — ED Triage Notes (Signed)
Pt presents to ED with c/o of L thumb laceration. Pt states he was using a miter saw. Pt states tetanus shot within the past 4 years, bleeding controlled at this time.

## 2021-09-13 NOTE — Discharge Instructions (Addendum)
Please keep laceration site clean dry and covered.  Call orthopedic office first thing tomorrow morning to schedule a follow-up appointment with hand specialist.  Return to the ER for any severe pain, uncontrollable bleeding, worsening symptoms or any changes in health.

## 2021-09-13 NOTE — ED Provider Notes (Signed)
[place] EMERGENCY DEPARTMENT Provider Note   CSN: [id number] Arrival date & time: 09/13/21  1801     History Chief Complaint  Patient presents with   Laceration    L thumb    Walter Hebert is a 70 y.o. male.  Presents to the emergency department evaluation of left thumb laceration.  Patient was using a soft to cut some Tylenol and cut through his left thumb.  Tetanus status unknown.  Pain mild.  He has a large tendon tissue and nail missing from the distal portion of the left thumb.  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   High grade prostatic intraepithelial neoplasia 03/20/2015   Elevated PSA 03/20/2015    Past Surgical History:  Procedure Laterality Date   COLONOSCOPY N/A 05/18/2015   Procedure: COLONOSCOPY;  Surgeon: Lucilla Lame, MD;  Location: [facility] ENDOSCOPY;  Service: Endoscopy;  Laterality: N/A;       Family History  Problem Relation Age of Onset   Hyperlipidemia Other    Prostate cancer Brother        DECEASED   Healthy Father    Healthy Mother    Kidney disease Neg Hx     Social History   Tobacco Use   Smoking status: Never   Smokeless tobacco: Never  Vaping Use   Vaping Use: Never used  Substance Use Topics   Alcohol use: No    Comment: Quit- 1990   Drug use: No    Home Medications Prior to Admission medications   Medication Sig  Start Date End Date Taking? Authorizing Provider  cephALEXin (KEFLEX) 500 MG capsule Take 1 capsule (500 mg total) by mouth 4 (four) times daily for 7 days. 09/13/21 09/20/21 Yes Duanne Guess, PA-C  HYDROcodone-acetaminophen (NORCO) 5-325 MG tablet Take 1 tablet by mouth every 4 (four) hours as needed for moderate pain. 09/13/21  Yes Duanne Guess, PA-C  Ascorbic Acid (VITAMIN C PO) Take 1 capsule daily by mouth.     [provider]  Multiple Vitamin (MULTIVITAMIN) capsule Take 1 capsule by mouth daily. Reported on 12/24/2015    [provider]  sildenafil (VIAGRA) 100 MG tablet Take 1 tablet (100 mg total) by mouth daily as needed for erectile dysfunction. Take two hours prior to intercourse on an empty stomach 03/31/21   Zara Council A, PA-C  VITAMIN E PO Take by mouth.    [provider]    Allergies    Chloroquine and Hydroxychloroquine sulfate  Review of Systems   Review of Systems  Constitutional:  Negative for fever.  Musculoskeletal:  Positive for arthralgias. Negative for back pain, gait problem and joint swelling.  Skin:  Positive for wound.  Neurological:  Negative for dizziness, numbness and headaches.   Physical Exam Updated Vital Signs BP (!) 186/83 (BP  Location: Right Arm)   Pulse (!) 57   Temp 98.2 F (36.8 C) (Oral)   Resp 16   SpO2 99%   Physical Exam Constitutional:      Appearance: He is well-developed.  HENT:     Head: Normocephalic and atraumatic.  Eyes:     Conjunctiva/sclera: Conjunctivae normal.  Cardiovascular:     Rate and Rhythm: Normal rate.  Pulmonary:     Effort: Pulmonary effort is normal. No respiratory distress.  Musculoskeletal:        General: Normal range of motion.     Cervical back: Normal range of motion.     Comments: Radial aspect of the left distal phalanx shows an oblique laceration through the nail, distal phalanx and soft tissues of the pad of the distal phalanx.  There is a slight visible portion  at the very end of the distal phalanx, this can be covered with soft tissue with manipulation of the tissues.  Patient is able to maintain active extension.  Sensation is intact.  No other injuries.  Skin:    General: Skin is warm.     Findings: No rash.  Neurological:     Mental Status: He is alert and oriented to person, place, and time.  Psychiatric:        Behavior: Behavior normal.        Thought Content: Thought content normal.    ED Results / Procedures / Treatments   Labs (all labs ordered are listed, but only abnormal results are displayed) Labs Reviewed - No data to display  EKG None  Radiology DG Finger Thumb Left  Result Date: 09/13/2021 CLINICAL DATA:  Left thumb laceration EXAM: LEFT THUMB 2+V COMPARISON:  None. FINDINGS: Frontal, oblique, lateral views of the left first digit demonstrate amputation of the distal tuft of the first distal phalanx and overlying soft tissues. 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 Mild osteoarthritis of the first metacarpophalangeal joint. IMPRESSION: 1. 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 2. Osteoarthritis. Electronically Signed   By: Randa Ngo M.D.   On: 09/13/2021 19:31    Procedures .Marland KitchenLaceration Repair  Date/Time: 09/13/2021 8:43 PM Performed by: Duanne Guess, PA-C Authorized by: Duanne Guess, PA-C   Consent:    Consent obtained:  Verbal   Consent given by:  Patient Anesthesia:    Anesthesia method:  Nerve block   Block anesthetic:  Lidocaine 1% w/o epi   Block technique:  Digital block left thumb   Block injection procedure:  Anatomic landmarks identified and introduced needle   Block outcome:  Anesthesia achieved Laceration details:    Location:  Finger   Finger location:  L thumb   Length (cm):  3.5 (Avulsion soft tissue) Exploration:    Hemostasis achieved with:  Tourniquet and direct pressure   Imaging obtained: x-ray      Imaging outcome: foreign body not noted     Wound exploration: wound explored through full range of motion     Contaminated: no   Treatment:    Area cleansed with:  Povidone-iodine and saline   Amount of cleaning:  Extensive   Irrigation volume:  Soak Skin repair:    Repair method:  Tissue adhesive Repair type:    Repair type:  Complex Post-procedure details:    Dressing:  Bulky dressing   Procedure completion:  Tolerated Comments:     Patient avulsed the tip of the left thumb.  Thumb  was soaked in Betadine and saline and a thumb tourniquet was applied to stop the bleeding.  Soft tissues were used to cover the small portion of the distal phalanx that was very minimally exposed.  A bloodless field was achieved and skin was covered with Dermabond.   Medications Ordered in ED Medications  lidocaine (PF) (XYLOCAINE) 1 % injection 5 mL (has no administration in time range)  cephALEXin (KEFLEX) capsule 500 mg (has no administration in time range)    ED Course  I have reviewed the triage vital signs and the nursing notes.  Pertinent labs & imaging results that were available during my care of the patient were reviewed by me and considered in my medical decision making (see chart for details).    MDM Rules/Calculators/A&P                         70 year old with avulsion to the left thumb.  He has a distal phalanx fracture.  He is placed on prophylactic antibiotics and tetanus is updated.  Soft tissues approximated where no exposed bone and Dermabond was applied.  A small dressing was applied.  Patient will follow-up with hand surgeon to discuss further treatment options.  He understands signs symptoms return to the ER for Final Clinical Impression(s) / ED Diagnoses Final diagnoses:  Laceration of left thumb without foreign body with damage to nail, initial encounter  Open displaced fracture of distal phalanx of left thumb, initial encounter    Rx / DC Orders ED Discharge Orders           Ordered    cephALEXin (KEFLEX) 500 MG capsule  4 times daily        09/13/21 2038    HYDROcodone-acetaminophen ([place]) 5-325 MG tablet  Every 4 hours PRN        09/13/21 2038             Renata Caprice 09/13/21 2046    Naaman Plummer, MD 09/13/21 2233

## 2021-09-13 NOTE — ED Provider Notes (Deleted)
Emergency Medicine Provider Triage Evaluation Note  Walter Hebert , a 70 y.o. male  was evaluated in triage.  Pt complains of 1 and half centimeter linear left fourth digit laceration deep to underlying adipose tissue.  Bleeding controlled.  Patient would like Dermabond.  Review of Systems  Positive: Patient has finger laceration. Negative: Numbness or tingling of the left fourth digit.  Physical Exam  There were no vitals taken for this visit. Gen:   Awake, no distress   Resp:  Normal effort  MSK:   Range of motion of the left fourth digit difficult to assess due to pain. Other:  Patient has 1-1/2 cm linear vertical laceration along volar aspect of left fourth digit deep to underlying adipose tissue.  Patient would prefer Dermabond for closure with splinting.  Medical Decision Making  Medically screening exam initiated at 6:15 PM.  Appropriate orders placed.  Walter Hebert was informed that the remainder of the evaluation will be completed by another provider, this initial triage assessment does not replace that evaluation, and the importance of remaining in the ED until their evaluation is complete.  Assessment and plan Finger laceration 70 year old male presents to the emergency department with a superficial left fourth digit finger laceration.  Patient is appropriate for flex for laceration repair with Dermabond.   Vallarie Mare [place], [place] 09/13/21 1816

## 2021-09-14 ENCOUNTER — Encounter: Payer: Self-pay | Admitting: Emergency Medicine

## 2021-09-14 ENCOUNTER — Other Ambulatory Visit: Payer: Self-pay

## 2021-09-14 ENCOUNTER — Emergency Department
Admission: EM | Admit: 2021-09-14 | Discharge: 2021-09-14 | Disposition: A | Discharge status: Home / Self Care | Payer: 59 | Attending: Emergency Medicine | Admitting: Emergency Medicine

## 2021-09-14 DIAGNOSIS — Z4800 Encounter for change or removal of nonsurgical wound dressing: Secondary | ICD-10-CM | POA: Insufficient documentation

## 2021-09-14 DIAGNOSIS — S6992XD Unspecified injury of left wrist, hand and finger(s), subsequent encounter: Secondary | ICD-10-CM | POA: Diagnosis present

## 2021-09-14 DIAGNOSIS — W312XXD Contact with powered woodworking and forming machines, subsequent encounter: Secondary | ICD-10-CM | POA: Insufficient documentation

## 2021-09-14 DIAGNOSIS — S61012D Laceration without foreign body of left thumb without damage to nail, subsequent encounter: Secondary | ICD-10-CM | POA: Insufficient documentation

## 2021-09-14 DIAGNOSIS — Z5189 Encounter for other specified aftercare: Secondary | ICD-10-CM

## 2021-09-14 NOTE — ED Provider Notes (Signed)
[facility] Emergency Department Provider Note  ____________________________________________   Event Date/Time   First MD Initiated Contact with Patient 09/14/21 1022     (approximate)  I have reviewed the triage vital signs and the nursing notes.   HISTORY  Chief Complaint Laceration    HPI Walter Hebert is a 70 y.o. male presents emergency department stating he needs a new bandage.  Was seen here last night for a finger avulsion.  States he has bled through the bandage and was told he could return for a dressing change.  No fever or chills.  No sign of infection.  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 Authorizing Provider  Ascorbic Acid (VITAMIN C PO) Take 1 capsule daily by mouth.     [provider]  cephALEXin (KEFLEX) 500 MG capsule Take 1 capsule (500 mg total) by mouth 4 (four) times daily for 7 days. 09/13/21 09/20/21  Duanne Guess, PA-C  HYDROcodone-acetaminophen (NORCO) 5-325 MG tablet Take 1  tablet by mouth every 4 (four) hours as needed for moderate pain. 09/13/21   Duanne Guess, PA-C  Multiple Vitamin (MULTIVITAMIN) capsule Take 1 capsule by mouth daily. Reported on 12/24/2015    [provider]  sildenafil (VIAGRA) 100 MG tablet Take 1 tablet (100 mg total) by mouth daily as needed for erectile dysfunction. Take two hours prior to intercourse on an empty stomach 03/31/21   Zara Council A, PA-C  VITAMIN E PO Take by mouth.    [provider]    Allergies Chloroquine and Hydroxychloroquine sulfate  Family History  Problem Relation Age of Onset   Hyperlipidemia Other    Prostate cancer Brother        DECEASED   Healthy Father    Healthy Mother    Kidney disease Neg Hx     Social History Social History   Tobacco Use   Smoking status: Never   Smokeless tobacco: Never  Vaping Use   Vaping Use: Never used  Substance Use Topics   Alcohol use: No    Comment: Quit- 1990   Drug use: No    Review of Systems  Constitutional: No fever/chills Eyes: No visual changes. ENT: No sore throat. Respiratory: Denies cough Cardiovascular: Denies chest pain Gastrointestinal: Denies abdominal pain Genitourinary: Negative for dysuria. Musculoskeletal: Negative for back pain. Skin: Negative for rash. Psychiatric: no mood changes,     ____________________________________________   PHYSICAL EXAM:  VITAL SIGNS: ED Triage Vitals  Enc Vitals Group     BP 09/14/21 0957 (!) 146/80     Pulse Rate 09/14/21 0957 66     Resp 09/14/21 0957 17     Temp 09/14/21 0957 98.4 F (36.9 C)     Temp Source 09/14/21 0957 Oral     SpO2 09/14/21 0957 100 %     Weight 09/14/21 0952 208 lb 15.9 oz (94.8 kg)     Height 09/14/21 0952 5\' 8"  (1.727 m)     Head Circumference --      Peak Flow --      Pain Score 09/14/21 0952 2     Pain Loc --      Pain Edu? --      Excl. in [place]? --     Constitutional: Alert and oriented. Well appearing and in no acute  distress. Eyes: Conjunctivae are normal.  Head: Atraumatic. Nose: No congestion/rhinnorhea. Mouth/Throat: Mucous membranes are moist.   Neck:  supple no lymphadenopathy noted Cardiovascular: Normal rate, regular rhythm.  Respiratory: Normal respiratory effort.  No retractions,  GU: deferred Musculoskeletal: FROM all extremities, warm and well perfused, wound appears to be healing, no pus or drainage Neurologic:  Normal speech and language.  Skin:  Skin is warm, dry and intact. No rash noted. Psychiatric: Mood and affect are normal. Speech and behavior are normal.  ____________________________________________   LABS (all labs ordered are listed, but only abnormal results are displayed)  Labs Reviewed - No data to display ____________________________________________   ____________________________________________  RADIOLOGY    ____________________________________________   PROCEDURES  Procedure(s) performed: No  Procedures    ____________________________________________   INITIAL IMPRESSION / ASSESSMENT AND PLAN / ED COURSE  Pertinent labs & imaging results that were available during my care of the patient were reviewed by me and considered in my medical decision making (see chart for details).   Patient is a 70 year old male presents with finger avulsion.  See HPI.  Physical exam shows the wound to be healing.  Dressing change was performed by nursing staff.  He was instructed to continue to follow-up with orthopedics.  Is discharged stable condition.     Walter Hebert was evaluated in Emergency Department on 09/14/2021 for the symptoms described in the history of present illness. He was evaluated in the context of the global COVID-19 pandemic, which necessitated consideration that the patient might be at risk for infection with the SARS-CoV-2 virus that causes COVID-19. Institutional protocols and algorithms that pertain to the evaluation of patients at risk for  COVID-19 are in a state of rapid change based on information released by regulatory bodies including the CDC and federal and state organizations. These policies and algorithms were followed during the patient's care in the ED.    As part of my medical decision making, I reviewed the following data within the [place] notes reviewed and incorporated, Old chart reviewed, Notes from prior ED visits, and [place] Controlled Substance Database  ____________________________________________   FINAL CLINICAL IMPRESSION(S) / ED DIAGNOSES  Final diagnoses:  Encounter for wound care      NEW MEDICATIONS STARTED DURING THIS VISIT:  Discharge Medication List as of 09/14/2021 11:03 AM       Note:  This document was prepared using Dragon voice recognition software and may include unintentional dictation errors.    Versie Starks, PA-C 09/14/21 1513    Blake Divine, MD 09/15/21 2329

## 2021-09-14 NOTE — ED Triage Notes (Signed)
Pt comes into the ED via POV c/o left thumb laceration from using a miter saw.  Pt states he was seen here yesterday for the same and did not get stitches.  Pt states the thumb started bleeding again last night.  Currently the bleeding is under control and wrapped.

## 2021-09-14 NOTE — ED Notes (Signed)
Left hand 1st digit (thumb) drsg changed. Wound cleansed , new clean dry drsg applied.   Bleeding controled , no signs of infection

## 2021-09-19 ENCOUNTER — Other Ambulatory Visit: Payer: Self-pay | Admitting: Family Medicine

## 2021-09-19 DIAGNOSIS — R972 Elevated prostate specific antigen [PSA]: Secondary | ICD-10-CM

## 2021-09-28 ENCOUNTER — Other Ambulatory Visit: Payer: Self-pay

## 2021-09-29 NOTE — Progress Notes (Signed)
09/28/2020 9:22 AM   Walter Hebert 1951/04/03 [id number]  Referring provider: Olin Hauser, DO [address] [place],  [place] [address]  Chief Complaint  Patient presents with   Elevated PSA    Urological history: 1. Elevated PSA -PSA trend  8.3 in 12/2013 - bx positive for HGPIN             5.2 in 10/2014             6.3 in 06/2015             4.5 in 09/2015             4.5 in 11/2015             4.5 in 10/2017 - started finasteride             3.5 in 04/2016             4.2 in 10/2016 - stopped finasteride              4.1 in 04/2017             4.3 in 10/2017             6.2 in 04/2018              MRI negative for high grade cancer              Restart finasteride 5 mg on 07/15/2018             4.6 in 10/2018             4.1 in 01/2019 - No finasteride - stopped it two months ago             5.8 in 08/2019   6.0 in 02/2020             6.1 in 09/2020  6.3 in 03/2021  Today's PSA pending  MRI of prostate on 07/11/2018 noted no findings of macroscopic or high-grade prostate carcinoma.  Prostate volume 69 cc.  Moderate benign prostatic hyperplasia, resulting in compression of the peripheral zone.  Bladder wall thickening, suggesting a component of outlet obstruction.  PSAD 0.084.    2. Family history of prostate cancer -brother with fatal prostate cancer  3. BPH with LU TS -I PSS 3/1  HPI: Walter Hebert is a 70 y.o. male who returns for a 6 month follow up.    He has no urinary complaints other than early morning frequency and nocturia x 1.   Patient denies any modifying or aggravating factors.  Patient denies any gross hematuria, dysuria or suprapubic/flank pain.  Patient denies any fevers, chills, nausea or vomiting.     IPSS     Row Name 09/30/21 0900         International Prostate Symptom Score   How often have you had the sensation of not emptying your bladder? Not at All     How often have you had to urinate less than every two hours? Less than 1  in 5 times     How often have you found you stopped and started again several times when you urinated? Not at All     How often have you found it difficult to postpone urination? Less than 1 in 5 times     How often have you had a weak urinary stream? Not at All     How often have you had to strain to start  urination? Not at All     How many times did you typically get up at night to urinate? 1 Time     Total IPSS Score 3           Quality of Life due to urinary symptoms   If you were to spend the rest of your life with your urinary condition just the way it is now how would you feel about that? Pleased             Score:  1-7 Mild 8-19 Moderate 20-35 Severe  He states his erections are moderate.     SHIM     Row Name 09/30/21 0907         SHIM: Over the last 6 months:   How do you rate your confidence that you could get and keep an erection? Moderate     When you had erections with sexual stimulation, how often were your erections hard enough for penetration (entering your partner)? Most Times (much more than half the time)     During sexual intercourse, how often were you able to maintain your erection after you had penetrated (entered) your partner? Most Times (much more than half the time)     During sexual intercourse, how difficult was it to maintain your erection to completion of intercourse? Slightly Difficult     When you attempted sexual intercourse, how often was it satisfactory for you? 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 If you have any questions, ask your nurse or doctor.          STOP taking these medications    HYDROcodone-acetaminophen 5-325 MG tablet Commonly known as: Norco Stopped by: Zara Council, PA-C       TAKE these medications    multivitamin capsule Take 1 capsule by mouth daily. Reported on 12/24/2015   sildenafil 100 MG tablet Commonly known as: VIAGRA Take 1 tablet (100 mg total) by mouth daily as needed for erectile dysfunction. Take two hours prior to intercourse on an empty stomach   VITAMIN C PO Take 1 capsule daily by mouth.   VITAMIN E PO Take by mouth.        Allergies:  Allergies  Allergen Reactions   Chloroquine Itching   Hydroxychloroquine Sulfate Hives    Family History: Family History  Problem Relation Age of Onset   Hyperlipidemia Other  Prostate cancer Brother        DECEASED   Healthy Father    Healthy Mother    Kidney disease Neg Hx     Social History:  reports that he has never smoked. He has never used smokeless tobacco. He reports that he does not drink alcohol and does not use drugs.   Physical Exam: BP 134/79   Pulse 86   Ht 5\' 8"  (1.727 m)   Wt 209 lb (94.8 kg)   BMI 31.78 kg/m   Constitutional:  Well nourished. Alert and oriented, No acute distress. HEENT: [place] AT, mask in place  Trachea midline Cardiovascular: No clubbing, cyanosis, or edema. Respiratory: Normal respiratory effort, no increased work of breathing. GU: No CVA tenderness.  No bladder fullness or masses.  Patient with circumcised phallus. Urethral  meatus is patent.  No penile discharge. No penile lesions or rashes. Scrotum without lesions, cysts, rashes and/or edema.  Testicles are located scrotally bilaterally. No masses are appreciated in the testicles. Left and right epididymis are normal. Rectal: Patient with  normal sphincter tone. Anus and perineum without scarring or rashes. No rectal masses are appreciated. Prostate is approximately 60 + grams, could only palpate the apex and midportion of the gland, no nodules are appreciated. Seminal vesicles could not be palpated.  Neurologic: Grossly intact, no focal deficits, moving all 4 extremities. Psychiatric: Normal mood and affect.   Pertinent Imaging N/A  Assessment & Plan:   1. Elevated PSA -PSA pending  2. BPH with LUTS -PSA pending -DRE benign -UA benign -continue conservative management, avoiding bladder irritants and timed voiding's   3. Family history of prostate cancer   -Brother with fatal prostate cancer   4. ED -does not want to take medications at this time  Return in about 6 months (around 03/31/2022) for IPSS, SHIM, PSA and exam.  [facility] [address], [place], [place] [address] [phone] Zara Council, PA-C

## 2021-09-30 ENCOUNTER — Other Ambulatory Visit: Payer: 59

## 2021-09-30 ENCOUNTER — Ambulatory Visit (INDEPENDENT_AMBULATORY_CARE_PROVIDER_SITE_OTHER): Payer: 59 | Admitting: Urology

## 2021-09-30 ENCOUNTER — Encounter: Payer: Self-pay | Admitting: Urology

## 2021-09-30 ENCOUNTER — Other Ambulatory Visit: Payer: Self-pay

## 2021-09-30 ENCOUNTER — Telehealth: Payer: Self-pay

## 2021-09-30 VITALS — BP 134/79 | HR 86 | Ht 68.0 in | Wt 209.0 lb

## 2021-09-30 DIAGNOSIS — N401 Enlarged prostate with lower urinary tract symptoms: Secondary | ICD-10-CM | POA: Diagnosis not present

## 2021-09-30 DIAGNOSIS — Z8042 Family history of malignant neoplasm of prostate: Secondary | ICD-10-CM | POA: Diagnosis not present

## 2021-09-30 DIAGNOSIS — R972 Elevated prostate specific antigen [PSA]: Secondary | ICD-10-CM

## 2021-09-30 DIAGNOSIS — N529 Male erectile dysfunction, unspecified: Secondary | ICD-10-CM

## 2021-09-30 DIAGNOSIS — N138 Other obstructive and reflux uropathy: Secondary | ICD-10-CM

## 2021-09-30 NOTE — Telephone Encounter (Signed)
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 He was was seen in the ED on 09/13/21 for avulsed to the tip of the left thumb. The result of his injury effects his ability to work. He brought by some information concerning this that was placed in your box. He is requesting a letter and a copy of his ED notes to attach to the letter for him to take to [employer].

## 2021-10-01 LAB — PSA: Prostate Specific Ag, Serum: 7.7 ng/mL — ABNORMAL HIGH (ref 0.0–4.0)

## 2021-10-02 ENCOUNTER — Other Ambulatory Visit: Payer: Self-pay | Admitting: Urology

## 2021-10-02 DIAGNOSIS — R972 Elevated prostate specific antigen [PSA]: Secondary | ICD-10-CM

## 2021-10-03 ENCOUNTER — Encounter: Payer: Self-pay | Admitting: Family Medicine

## 2021-10-03 NOTE — Telephone Encounter (Signed)
I spoke with Walter Hebert earlier today and reviewed his case and the ED visit 09/13/21. I have written his letter and sent it to his mychart for review. Also printed signed a copy here at office, he can pick up whenever ready.  Nobie Putnam, [facility] 10/03/2021, 12:51 PM

## 2021-10-10 ENCOUNTER — Other Ambulatory Visit: Payer: 59

## 2021-10-11 ENCOUNTER — Other Ambulatory Visit: Payer: Self-pay

## 2021-10-11 DIAGNOSIS — N138 Other obstructive and reflux uropathy: Secondary | ICD-10-CM

## 2021-10-11 DIAGNOSIS — Z0000 Encounter for general adult medical examination without abnormal findings: Secondary | ICD-10-CM

## 2021-10-11 DIAGNOSIS — E669 Obesity, unspecified: Secondary | ICD-10-CM

## 2021-10-11 DIAGNOSIS — N401 Enlarged prostate with lower urinary tract symptoms: Secondary | ICD-10-CM

## 2021-10-11 DIAGNOSIS — R7309 Other abnormal glucose: Secondary | ICD-10-CM

## 2021-10-11 DIAGNOSIS — E7800 Pure hypercholesterolemia, unspecified: Secondary | ICD-10-CM

## 2021-10-12 ENCOUNTER — Other Ambulatory Visit: Payer: 59

## 2021-10-12 ENCOUNTER — Other Ambulatory Visit: Payer: Self-pay

## 2021-10-13 LAB — LIPID PANEL
Cholesterol: 218 mg/dL — ABNORMAL HIGH (ref <200)
HDL: 59 mg/dL (ref >=40)
LDL Cholesterol (Calc): 134 mg/dL (calc) — ABNORMAL HIGH
Non-HDL Cholesterol (Calc): 159 mg/dL (calc) — ABNORMAL HIGH (ref <130)
Total CHOL/HDL Ratio: 3.7 (calc) (ref <5.0)
Triglycerides: 133 mg/dL (ref <150)

## 2021-10-13 LAB — CBC WITH DIFFERENTIAL/PLATELET
Absolute Monocytes: 308 cells/uL (ref 200–950)
Basophils Absolute: 32 cells/uL (ref 0–200)
Basophils Relative: 0.9 %
Eosinophils Absolute: 109 cells/uL (ref 15–500)
Eosinophils Relative: 3.1 %
HCT: 49.1 % (ref 38.5–50.0)
Hemoglobin: 15.4 g/dL (ref 13.2–17.1)
Lymphs Abs: 2107 cells/uL (ref 850–3900)
MCH: 24.6 pg — ABNORMAL LOW (ref 27.0–33.0)
MCHC: 31.4 g/dL — ABNORMAL LOW (ref 32.0–36.0)
MCV: 78.3 fL — ABNORMAL LOW (ref 80.0–100.0)
MPV: 10.8 fL (ref 7.5–12.5)
Monocytes Relative: 8.8 %
Neutro Abs: 945 cells/uL — ABNORMAL LOW (ref 1500–7800)
Neutrophils Relative %: 27 %
Platelets: 169 10*3/uL (ref 140–400)
RBC: 6.27 10*6/uL — ABNORMAL HIGH (ref 4.20–5.80)
RDW: 13.9 % (ref 11.0–15.0)
Total Lymphocyte: 60.2 %
WBC: 3.5 10*3/uL — ABNORMAL LOW (ref 3.8–10.8)

## 2021-10-13 LAB — HEMOGLOBIN A1C
Hgb A1c MFr Bld: 5.6 % of total Hgb (ref <5.7)
Mean Plasma Glucose: 114 mg/dL
eAG (mmol/L): 6.3 mmol/L

## 2021-10-13 LAB — COMPLETE METABOLIC PANEL WITH GFR
AG Ratio: 1.3 (calc) (ref 1.0–2.5)
ALT: 22 U/L (ref 9–46)
AST: 21 U/L (ref 10–35)
Albumin: 4.2 g/dL (ref 3.6–5.1)
Alkaline phosphatase (APISO): 71 U/L (ref 35–144)
BUN: 10 mg/dL (ref 7–25)
CO2: 28 mmol/L (ref 20–32)
Calcium: 9.8 mg/dL (ref 8.6–10.3)
Chloride: 104 mmol/L (ref 98–110)
Creat: 0.98 mg/dL (ref 0.70–1.35)
Globulin: 3.2 g/dL (calc) (ref 1.9–3.7)
Glucose, Bld: 82 mg/dL (ref 65–99)
Potassium: 4.9 mmol/L (ref 3.5–5.3)
Sodium: 142 mmol/L (ref 135–146)
Total Bilirubin: 0.5 mg/dL (ref 0.2–1.2)
Total Protein: 7.4 g/dL (ref 6.1–8.1)
eGFR: 84 mL/min/{1.73_m2} (ref >=60)

## 2021-10-13 LAB — TSH: TSH: 2.52 mIU/L (ref 0.40–4.50)

## 2021-10-14 ENCOUNTER — Encounter: Payer: 59 | Admitting: Family Medicine

## 2021-10-19 ENCOUNTER — Other Ambulatory Visit: Payer: Self-pay | Admitting: Family Medicine

## 2021-10-19 ENCOUNTER — Ambulatory Visit (INDEPENDENT_AMBULATORY_CARE_PROVIDER_SITE_OTHER): Payer: 59 | Admitting: Family Medicine

## 2021-10-19 ENCOUNTER — Encounter: Payer: Self-pay | Admitting: Family Medicine

## 2021-10-19 ENCOUNTER — Other Ambulatory Visit: Payer: Self-pay

## 2021-10-19 VITALS — BP 132/74 | HR 60 | Ht 68.0 in | Wt 206.4 lb

## 2021-10-19 DIAGNOSIS — Z0000 Encounter for general adult medical examination without abnormal findings: Secondary | ICD-10-CM

## 2021-10-19 DIAGNOSIS — R7309 Other abnormal glucose: Secondary | ICD-10-CM | POA: Diagnosis not present

## 2021-10-19 DIAGNOSIS — E7800 Pure hypercholesterolemia, unspecified: Secondary | ICD-10-CM | POA: Diagnosis not present

## 2021-10-19 DIAGNOSIS — E669 Obesity, unspecified: Secondary | ICD-10-CM

## 2021-10-19 DIAGNOSIS — Z23 Encounter for immunization: Secondary | ICD-10-CM

## 2021-10-19 MED ORDER — ROSUVASTATIN CALCIUM 10 MG PO TABS: 10.0000 mg | ORAL_TABLET | Freq: Every day | ORAL | 3 refills | Status: DC

## 2021-10-19 NOTE — Assessment & Plan Note (Signed)
Improving wt with lifestyle Encourage continue

## 2021-10-19 NOTE — Assessment & Plan Note (Signed)
Improved LDL still above goal Last lipid panel 09/2021 The 10-year ASCVD risk score (Arnett DK, et al., 2019) is: 11.3%  Plan: 1. START New rx Statin with Rosuvastatin 10mg  nightly for ASCVD risk reduction and lower LDL 2. Encourage improved lifestyle - low carb/cholesterol, reduce portion size,recommend exercise  Repeat lipid + CMET in 3 months

## 2021-10-19 NOTE — Progress Notes (Signed)
Subjective:    Patient ID: Walter Hebert, male    DOB: July 06, 1951, 70 y.o.   MRN: [id number]  Walter Hebert is a 70 y.o. male presenting on 10/19/2021 for Annual Exam   HPI  Here for Annual Physical and Lab Review.   Elevated A1c / Obesity BMI >31 / Elevated BP without HTN A1c down to 5.6 Weight improved down 3 lbs in 3 months Today reports doing well on improved lifestyle still Lifestyle - Diet: Continues healthy diet, still admits eating snack or meal in PM avoid late night large meal before bed. Reduced starches / carbs and portions. Drinks water mostly, also warm water with lemon/lime in AM - Exercise: None regular. Working. 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 Last PSA 7.7 09/30/21   HYPERLIPIDEMIA Last lipid 10/2021, still elevated LDL now improved 134 Interested in Statin - Not on any cholesterol medication at this time, declines   Chronic Low MCV Prior discussion. Long history of lab results with low MCV and normal Hgb.   Health Maintenance: -Due for Flu Shot, will receive today   Prostate CA Screening: Followed by [facility], history of elevated PSA.   DUE COVID Booster   Colon CA Screening: Last Colonoscopy 05/18/15 (done by Dr [facility]), results with hyperplastic polyp benign, good for 10 years. Currently asymptomatic. No known family history of colon CA. Next due in 2026.    Depression screen [facility] 2/9 04/13/2021 10/13/2020 10/09/2019  Decreased Interest 0 0 0  Down, Depressed, Hopeless 0 0 0  PHQ - 2 Score 0 0 0  Altered sleeping - - 0  Tired, decreased energy - - 0  Change in appetite - - 0  Feeling bad or failure about yourself  - - 0  Trouble concentrating - - 0  Moving slowly or fidgety/restless - - 0  Suicidal thoughts - - 0  PHQ-9 Score - - 0  Difficult doing work/chores - - -    Past  Medical History:  Diagnosis Date   Elevated PSA    Enlarged prostate    High grade prostatic intraepithelial neoplasia    found in two cores on biopsy may 2015   Night sweats    Synovial cyst    Past Surgical History:  Procedure Laterality Date   COLONOSCOPY N/A 05/18/2015   Procedure: COLONOSCOPY;  Surgeon: Lucilla Lame, MD;  Location: [facility] ENDOSCOPY;  Service: Endoscopy;  Laterality: N/A;   Social History   Socioeconomic History   Marital status: Married    Spouse name: Not on file   Number of children: Not on file   Years of education: Not on file   Highest education level: Not on file  Occupational History   Not on file  Tobacco Use   Smoking status: Never   Smokeless tobacco: Never  Vaping Use   Vaping Use: Never used  Substance and Sexual Activity   Alcohol use: No    Comment: Quit- 1990   Drug use: No   Sexual activity: Not on file  Other Topics Concern   Not on file  Social History Narrative   Not on file   Social Determinants of Health   Financial Resource Strain: Not on file  Food Insecurity: Not on file  Transportation Needs: Not on file  Physical Activity: Not on file  Stress: Not on  file  Social Connections: Not on file  Intimate Partner Violence: Not on file   Family History  Problem Relation Age of Onset   Hyperlipidemia Other    Prostate cancer Brother        DECEASED   Healthy Father    Healthy Mother    Kidney disease Neg Hx    Current Outpatient Medications on File Prior to Visit  Medication Sig   Ascorbic Acid (VITAMIN C PO) Take 1 capsule daily by mouth.    Multiple Vitamin (MULTIVITAMIN) capsule Take 1 capsule by mouth daily. Reported on 12/24/2015   sildenafil (VIAGRA) 100 MG tablet Take 1 tablet (100 mg total) by mouth daily as needed for erectile dysfunction. Take two hours prior to intercourse on an empty stomach   VITAMIN E PO Take by mouth.   No current facility-administered medications on file prior to visit.    Review of  Systems  Constitutional:  Negative for activity change, appetite change, chills, diaphoresis, fatigue and fever.  HENT:  Negative for congestion and hearing loss.   Eyes:  Negative for visual disturbance.  Respiratory:  Negative for cough, chest tightness, shortness of breath and wheezing.   Cardiovascular:  Negative for chest pain, palpitations and leg swelling.  Gastrointestinal:  Negative for abdominal pain, constipation, diarrhea, nausea and vomiting.  Genitourinary:  Negative for dysuria, frequency and hematuria.  Musculoskeletal:  Negative for arthralgias and neck pain.  Skin:  Negative for rash.  Neurological:  Negative for dizziness, weakness, light-headedness, numbness and headaches.  Hematological:  Negative for adenopathy.  Psychiatric/Behavioral:  Negative for behavioral problems, dysphoric mood and sleep disturbance.   Per HPI unless specifically indicated above      Objective:    BP 132/74 (BP Location: Left Arm, Cuff Size: Normal)   Pulse 60   Ht _0  (1.727 m)   Wt 206 lb 6.4 oz (93.6 kg)   SpO2 100%   BMI 31.38 kg/m   Wt Readings from Last 3 Encounters:  10/19/21 206 lb 6.4 oz (93.6 kg)  09/30/21 209 lb (94.8 kg)  09/14/21 208 lb 15.9 oz (94.8 kg)    Physical Exam Vitals and nursing note reviewed.  Constitutional:      General: He is not in acute distress.    Appearance: He is well-developed. He is not diaphoretic.     Comments: Well-appearing, comfortable, cooperative  HENT:     Head: Normocephalic and atraumatic.  Eyes:     General:        Right eye: No discharge.        Left eye: No discharge.     Conjunctiva/sclera: Conjunctivae normal.     Pupils: Pupils are equal, round, and reactive to light.  Neck:     Thyroid: No thyromegaly.  Cardiovascular:     Rate and Rhythm: Normal rate and regular rhythm.     Pulses: Normal pulses.     Heart sounds: Normal heart sounds. No murmur heard. Pulmonary:     Effort: Pulmonary effort is normal. No  respiratory distress.     Breath sounds: Normal breath sounds. No wheezing or rales.  Abdominal:     General: Bowel sounds are normal. There is no distension.     Palpations: Abdomen is soft. There is no mass.     Tenderness: There is no abdominal tenderness.  Musculoskeletal:        General: No tenderness. Normal range of motion.     Cervical back: Normal range of motion and neck  supple.     Comments: Left thumb with healing large area laceration to tip of thumb, has scab and healing tissue and bandage in place.  Upper / Lower Extremities: - Normal muscle tone, strength bilateral upper extremities 5/5, lower extremities 5/5  Lymphadenopathy:     Cervical: No cervical adenopathy.  Skin:    General: Skin is warm and dry.     Findings: No erythema or rash.  Neurological:     Mental Status: He is alert and oriented to person, place, and time.     Comments: Distal sensation intact to light touch all extremities  Psychiatric:        Mood and Affect: Mood normal.        Behavior: Behavior normal.        Thought Content: Thought content normal.     Comments: Well groomed, good eye contact, normal speech and thoughts     Results for orders placed or performed in visit on 10/11/21  TSH  Result Value Ref Range   TSH 2.52 0.40 - 4.50 mIU/L  Lipid panel  Result Value Ref Range   Cholesterol 218 (H) <200 mg/dL   HDL 59 > OR = 40 mg/dL   Triglycerides 133 <150 mg/dL   LDL Cholesterol (Calc) 134 (H) mg/dL (calc)   Total CHOL/HDL Ratio 3.7 <5.0 (calc)   Non-HDL Cholesterol (Calc) 159 (H) <130 mg/dL (calc)  COMPLETE METABOLIC PANEL WITH GFR  Result Value Ref Range   Glucose, Bld 82 65 - 99 mg/dL   BUN 10 7 - 25 mg/dL   Creat 0.98 0.70 - 1.35 mg/dL   eGFR 84 > OR = 60 mL/min/1.17m   BUN/Creatinine Ratio NOT APPLICABLE 6 - 22 (calc)   Sodium 142 135 - 146 mmol/L   Potassium 4.9 3.5 - 5.3 mmol/L   Chloride 104 98 - 110 mmol/L   CO2 28 20 - 32 mmol/L   Calcium 9.8 8.6 - 10.3 mg/dL    Total Protein 7.4 6.1 - 8.1 g/dL   Albumin 4.2 3.6 - 5.1 g/dL   Globulin 3.2 1.9 - 3.7 g/dL (calc)   AG Ratio 1.3 1.0 - 2.5 (calc)   Total Bilirubin 0.5 0.2 - 1.2 mg/dL   Alkaline phosphatase (APISO) 71 35 - 144 U/L   AST 21 10 - 35 U/L   ALT 22 9 - 46 U/L  CBC with Differential/Platelet  Result Value Ref Range   WBC 3.5 (L) 3.8 - 10.8 Thousand/uL   RBC 6.27 (H) 4.20 - 5.80 Million/uL   Hemoglobin 15.4 13.2 - 17.1 g/dL   HCT 49.1 38.5 - 50.0 %   MCV 78.3 (L) 80.0 - 100.0 fL   MCH 24.6 (L) 27.0 - 33.0 pg   MCHC 31.4 (L) 32.0 - 36.0 g/dL   RDW 13.9 11.0 - 15.0 %   Platelets 169 140 - 400 Thousand/uL   MPV 10.8 7.5 - 12.5 fL   Neutro Abs 945 (L) 1,500 - 7,800 cells/uL   Lymphs Abs 2,107 850 - 3,900 cells/uL   Absolute Monocytes 308 200 - 950 cells/uL   Eosinophils Absolute 109 15 - 500 cells/uL   Basophils Absolute 32 0 - 200 cells/uL   Neutrophils Relative % 27 %   Total Lymphocyte 60.2 %   Monocytes Relative 8.8 %   Eosinophils Relative 3.1 %   Basophils Relative 0.9 %  Hemoglobin A1c  Result Value Ref Range   Hgb A1c MFr Bld 5.6 <5.7 % of total Hgb   Mean  Plasma Glucose 114 mg/dL   eAG (mmol/L) 6.3 mmol/L      Assessment & Plan:   Problem List Items Addressed This Visit     Obesity (BMI 30.0-34.9)    Improving wt with lifestyle Encourage continue      Hyperlipidemia    Improved LDL still above goal Last lipid panel 09/2021 The 10-year ASCVD risk score (Arnett DK, et al., 2019) is: 11.3%  Plan: 1. START New rx Statin with Rosuvastatin 75m nightly for ASCVD risk reduction and lower LDL 2. Encourage improved lifestyle - low carb/cholesterol, reduce portion size,recommend exercise  Repeat lipid + CMET in 3 months      Relevant Medications   rosuvastatin (CRESTOR) 10 MG tablet   Elevated hemoglobin A1c    A1c improved to 5.6 Keep up lifestyle improvements Monitor      Other Visit Diagnoses     Annual physical exam    -  Primary   Needs flu shot        Relevant Orders   Flu Vaccine QUAD High Dose(Fluad) (Completed)       Updated Health Maintenance information Flu Shot today COVID Booster when ready Colonoscopy 2026 Reviewed recent lab results with patient Encouraged improvement to lifestyle with diet and exercise Goal of weight loss   Meds ordered this encounter  Medications   rosuvastatin (CRESTOR) 10 MG tablet    Sig: Take 1 tablet (10 mg total) by mouth at bedtime.    Dispense:  90 tablet    Refill:  3       Follow up plan: Return in about 3 months (around 01/19/2022) for 3 month fasting lab only - no apt with me at that time. He can return for 6 month f/u.  ANobie Putnam [facility] 10/19/2021, 8:19 AM

## 2021-10-19 NOTE — Patient Instructions (Addendum)
Thank you for coming to the office today.  Flu shot today.  You can get the updated COVID booster at pharmacy when ready.  You are at increased risk of future Cardiovascular complications such as Heart Attack or Stroke from an artery blockage due to abnormal cholesterol and/or risk factors. - As discussed, Statin Cholesterol pills both can both LOWER cholesterol and REDUCE this future risk of heart attack and stroke - Start Rosuvastatin (generic Crestor) 10mg  pill once at bedtime every night  If you develop mild aches or pains in muscle or joint that does NOT improve or go away after first 3-4 weeks then this may require [place] to adjust the dose. First I would recommend STOPPING the medication for a few weeks until your ache and pain symptoms completely RESOLVE. Then you can restart at a LOWER DOSE either HALF a pill at bedtime every night or LESS OFTEN such as one pill a week only and then gradually increase to every other day or max dose of 3 times a week  Lastly, sometimes we need to try other versions of this medicine to find one that works for you and does not cause side effects.   DUE for FASTING BLOOD WORK (no food or drink after midnight before the lab appointment, only water or coffee without cream/sugar on the morning of)  SCHEDULE "Lab Only" visit in the morning at the clinic for lab draw in 3 MONTHS   - Make sure Lab Only appointment is at about 1 week before your next appointment, so that results will be available  For Lab Results, once available within 2-3 days of blood draw, you can can log in to MyChart online to view your results and a brief explanation. Also, we can discuss results at next follow-up visit.   Please schedule a Follow-up Appointment to: Return in about 3 months (around 01/19/2022) for 3 month fasting lab only - no apt with me at that time. He can return for 6 month f/u.  If you have any other questions or concerns, please feel free to call the office or send a  message through [place]. You may also schedule an earlier appointment if necessary.  Additionally, you may be receiving a survey about your experience at our office within a few days to 1 week by e-mail or mail. We value your feedback.  Nobie Putnam, DO [place]

## 2021-10-19 NOTE — Assessment & Plan Note (Signed)
A1c improved to 5.6 Keep up lifestyle improvements Monitor

## 2021-10-28 ENCOUNTER — Ambulatory Visit
Admission: RE | Admit: 2021-10-28 | Discharge: 2021-10-28 | Disposition: A | Discharge status: Home / Self Care | Payer: 59 | Source: Ambulatory Visit | Attending: Urology | Admitting: Urology

## 2021-10-28 DIAGNOSIS — R972 Elevated prostate specific antigen [PSA]: Secondary | ICD-10-CM | POA: Diagnosis present

## 2021-10-28 IMAGING — MR MR PROSTATE WO/W CM
56 series · 56 of 56 positions shown · IV contrast (9ml Gadavist)
Comparison: None.

CLINICAL DATA: Enlarged prostate, increased PSA over the last 6
months.

EXAM:
MR PROSTATE WITHOUT AND WITH CONTRAST
TECHNIQUE: Multiplanar multisequence MRI images were obtained of the pelvis
centered about the prostate. Pre and post contrast images were
obtained.
CONTRAST:  9mL GADAVIST GADOBUTROL 1 MMOL/ML IV SOLN

[Series 3: ax in&out whole · axial · 3.0mm · 1.19mm/px · 1 of 88 slices shown (1 of 2)]
[im 1/88]
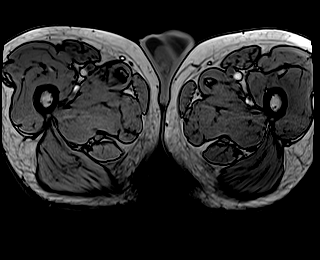

[Series 4: ax in&out whole · axial · 3.0mm · 1.19mm/px · 1 of 88 slices shown (2 of 2)]
[im 1/88]
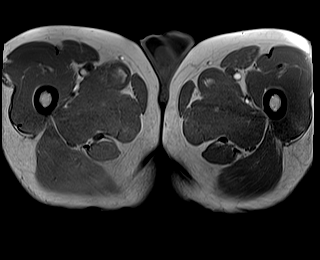

[Series 5: T2 · coronal · 3.0mm · 0.70mm/px · 1 of 35 slices shown (1 of 3)]
[im 1/35]
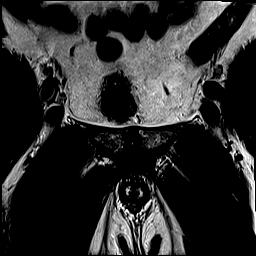

[Series 6: T2 · axial · 3.0mm · 0.56mm/px · 1 of 27 slices shown (2 of 3)]
[im 1/27]
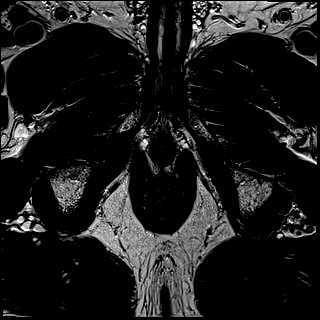

[Series 7: DWI · axial · 3.0mm · 0.86mm/px · 1 of 81 slices shown (1 of 3)]
[im 1/81]
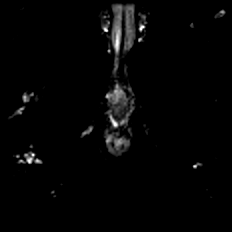

[Series 8: DWI · axial · 3.0mm · 0.86mm/px · 1 of 27 slices shown (2 of 3)]
[im 1/27]
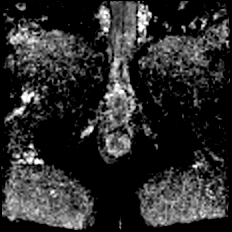

[Series 9: DWI · axial · 3.0mm · 0.86mm/px · 1 of 27 slices shown (3 of 3)]
[im 1/27]
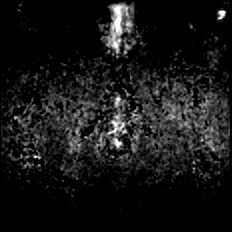

[Series 10: T2 · axial · 1.0mm · 1.04mm/px · 1 of 88 slices shown (3 of 3)]
[im 1/88]
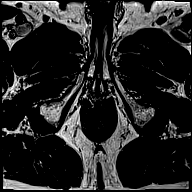

[Series 11: T1 · axial · 3.0mm · 1.15mm/px · 1 of 30 slices shown (1 of 48)]
[im 1/30]
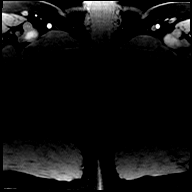

[Series 12: T1 · axial · 3.0mm · 1.15mm/px · 1 of 30 slices shown (2 of 48)]
[im 1/30]
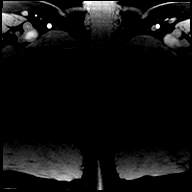

[Series 13: T1 · axial · 3.0mm · 1.15mm/px · 1 of 30 slices shown (3 of 48)]
[im 1/30]
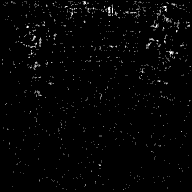

[Series 14: T1 · axial · 3.0mm · 1.15mm/px · 1 of 30 slices shown (4 of 48)]
[im 1/30]
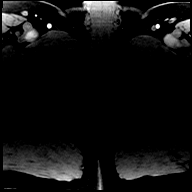

[Series 15: T1 · axial · 3.0mm · 1.15mm/px · 1 of 30 slices shown (5 of 48)]
[im 1/30]
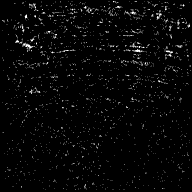

[Series 16: T1 · axial · 3.0mm · 1.15mm/px · 1 of 30 slices shown (6 of 48)]
[im 1/30]
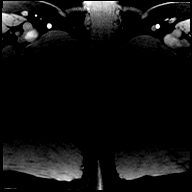

[Series 17: T1 · axial · 3.0mm · 1.15mm/px · 1 of 30 slices shown (7 of 48)]
[im 1/30]
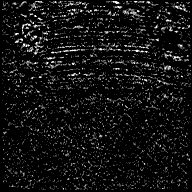

[Series 18: T1 · axial · 3.0mm · 1.15mm/px · 1 of 30 slices shown (8 of 48)]
[im 1/30]
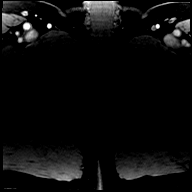

[Series 19: T1 · axial · 3.0mm · 1.15mm/px · 1 of 30 slices shown (9 of 48)]
[im 1/30]
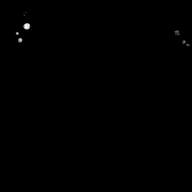

[Series 20: T1 · axial · 3.0mm · 1.15mm/px · 1 of 30 slices shown (10 of 48)]
[im 1/30]
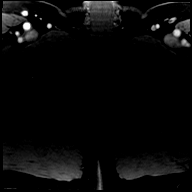

[Series 21: T1 · axial · 3.0mm · 1.15mm/px · 1 of 30 slices shown (11 of 48)]
[im 1/30]
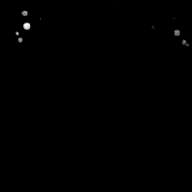

[Series 22: T1 · axial · 3.0mm · 1.15mm/px · 1 of 30 slices shown (12 of 48)]
[im 1/30]
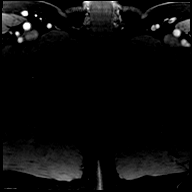

[Series 23: T1 · axial · 3.0mm · 1.15mm/px · 1 of 30 slices shown (13 of 48)]
[im 1/30]
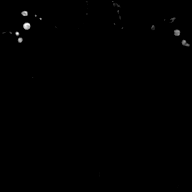

[Series 24: T1 · axial · 3.0mm · 1.15mm/px · 1 of 30 slices shown (14 of 48)]
[im 1/30]
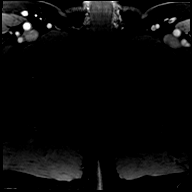

[Series 25: T1 · axial · 3.0mm · 1.15mm/px · 1 of 30 slices shown (15 of 48)]
[im 1/30]
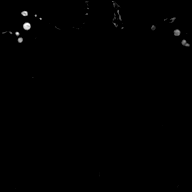

[Series 26: T1 · axial · 3.0mm · 1.15mm/px · 1 of 30 slices shown (16 of 48)]
[im 1/30]
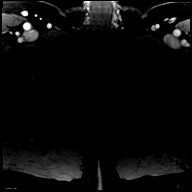

[Series 27: T1 · axial · 3.0mm · 1.15mm/px · 1 of 30 slices shown (17 of 48)]
[im 1/30]
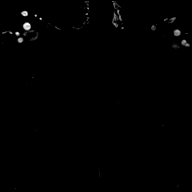

[Series 28: T1 · axial · 3.0mm · 1.15mm/px · 1 of 30 slices shown (18 of 48)]
[im 1/30]
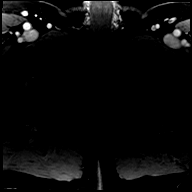

[Series 29: T1 · axial · 3.0mm · 1.15mm/px · 1 of 30 slices shown (19 of 48)]
[im 1/30]
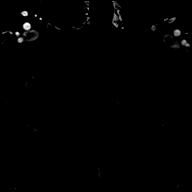

[Series 30: T1 · axial · 3.0mm · 1.15mm/px · 1 of 30 slices shown (20 of 48)]
[im 1/30]
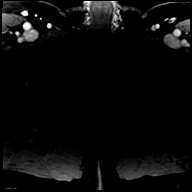

[Series 31: T1 · axial · 3.0mm · 1.15mm/px · 1 of 30 slices shown (21 of 48)]
[im 1/30]
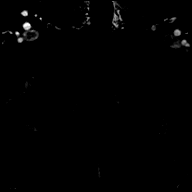

[Series 32: T1 · axial · 3.0mm · 1.15mm/px · 1 of 30 slices shown (22 of 48)]
[im 1/30]
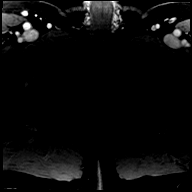

[Series 33: T1 · axial · 3.0mm · 1.15mm/px · 1 of 30 slices shown (23 of 48)]
[im 1/30]
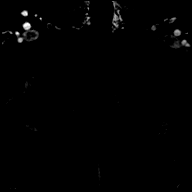

[Series 34: T1 · axial · 3.0mm · 1.15mm/px · 1 of 30 slices shown (24 of 48)]
[im 1/30]
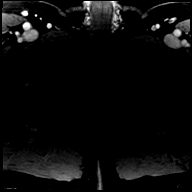

[Series 35: T1 · axial · 3.0mm · 1.15mm/px · 1 of 30 slices shown (25 of 48)]
[im 1/30]
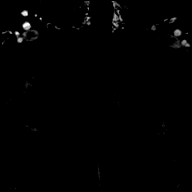

[Series 36: T1 · axial · 3.0mm · 1.15mm/px · 1 of 30 slices shown (26 of 48)]
[im 1/30]
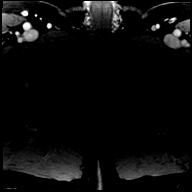

[Series 37: T1 · axial · 3.0mm · 1.15mm/px · 1 of 30 slices shown (27 of 48)]
[im 1/30]
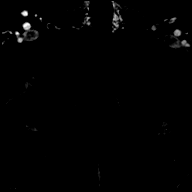

[Series 38: T1 · axial · 3.0mm · 1.15mm/px · 1 of 30 slices shown (28 of 48)]
[im 1/30]
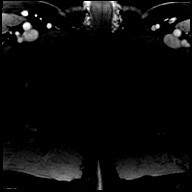

[Series 39: T1 · axial · 3.0mm · 1.15mm/px · 1 of 30 slices shown (29 of 48)]
[im 1/30]
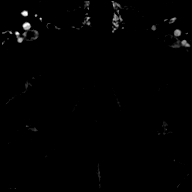

[Series 40: T1 · axial · 3.0mm · 1.15mm/px · 1 of 30 slices shown (30 of 48)]
[im 1/30]
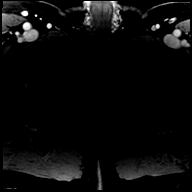

[Series 41: T1 · axial · 3.0mm · 1.15mm/px · 1 of 30 slices shown (31 of 48)]
[im 1/30]
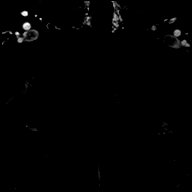

[Series 42: T1 · axial · 3.0mm · 1.15mm/px · 1 of 30 slices shown (32 of 48)]
[im 1/30]
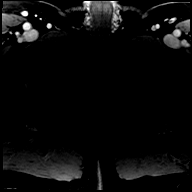

[Series 43: T1 · axial · 3.0mm · 1.15mm/px · 1 of 30 slices shown (33 of 48)]
[im 1/30]
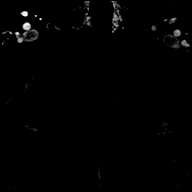

[Series 44: T1 · axial · 3.0mm · 1.15mm/px · 1 of 30 slices shown (34 of 48)]
[im 1/30]
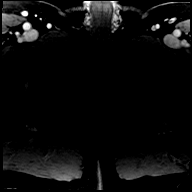

[Series 45: T1 · axial · 3.0mm · 1.15mm/px · 1 of 30 slices shown (35 of 48)]
[im 1/30]
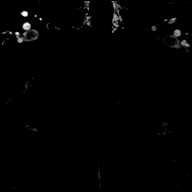

[Series 46: T1 · axial · 3.0mm · 1.15mm/px · 1 of 30 slices shown (36 of 48)]
[im 1/30]
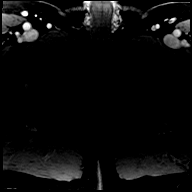

[Series 47: T1 · axial · 3.0mm · 1.15mm/px · 1 of 30 slices shown (37 of 48)]
[im 1/30]
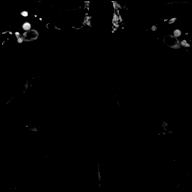

[Series 48: T1 · axial · 3.0mm · 1.15mm/px · 1 of 30 slices shown (38 of 48)]
[im 1/30]
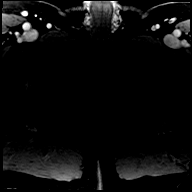

[Series 49: T1 · axial · 3.0mm · 1.15mm/px · 1 of 30 slices shown (39 of 48)]
[im 1/30]
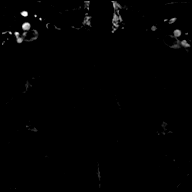

[Series 50: T1 · axial · 3.0mm · 1.15mm/px · 1 of 30 slices shown (40 of 48)]
[im 1/30]
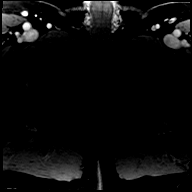

[Series 51: T1 · axial · 3.0mm · 1.15mm/px · 1 of 30 slices shown (41 of 48)]
[im 1/30]
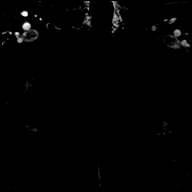

[Series 52: T1 · axial · 3.0mm · 1.15mm/px · 1 of 30 slices shown (42 of 48)]
[im 1/30]
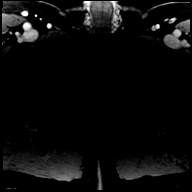

[Series 53: T1 · axial · 3.0mm · 1.15mm/px · 1 of 30 slices shown (43 of 48)]
[im 1/30]
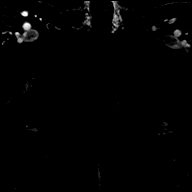

[Series 54: T1 · axial · 3.0mm · 1.15mm/px · 1 of 30 slices shown (44 of 48)]
[im 1/30]
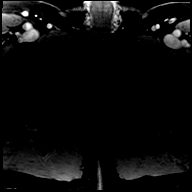

[Series 55: T1 · axial · 3.0mm · 1.15mm/px · 1 of 30 slices shown (45 of 48)]
[im 1/30]
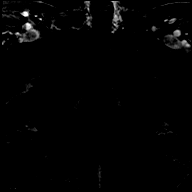

[Series 56: T1 · axial · 3.0mm · 1.15mm/px · 1 of 30 slices shown (46 of 48)]
[im 1/30]
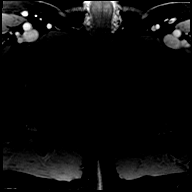

[Series 57: T1 · axial · 3.0mm · 1.15mm/px · 1 of 30 slices shown (47 of 48)]
[im 1/30]
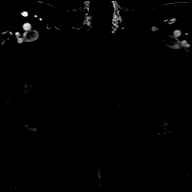

[Series 58: T1 · axial · 3.0mm · 1.15mm/px · 1 of 30 slices shown (48 of 48)]
[im 1/30]
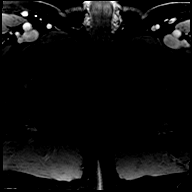

[56 of 56 positions shown; findings below may reference images not displayed]

FINDINGS: Prostate: Stable thinning of the left peripheral zone compared to
the right, likely postinflammatory.

Region of interest # 1: PI-RADS category 3 lesion of the left
posterolateral and posteromedial peripheral zone in the mid gland
with focally reduced T2 signal but no substantial degree of focal
early enhancement or restricted diffusion. This measures 0.31 cc
(1.5 by 0.3 by 0.7 cm) and is shown for example on image 47 series
10.

Encapsulated nodularity in the transition zone compatible with
benign prostatic hypertrophy.

Volume: 3D volumetric analysis: Prostate volume 86.97 cc (5.8 by
by 5.7 cm).

Transcapsular spread:  Absent

Seminal vesicle involvement: Absent

Neurovascular bundle involvement: Absent

Pelvic adenopathy: Absent

Bone metastasis: Absent

Other findings: Left scrotal hydrocele.
IMPRESSION: 1. Small PI-RADS category 3 lesion of the left peripheral zone.
Targeting data sent to [facility].
2. Benign prostatic hypertrophy and prostatomegaly.
3. Left scrotal hydrocele.

## 2021-10-28 MED ORDER — GADOBUTROL 1 MMOL/ML IV SOLN
9.0000 mL | Freq: Once | INTRAVENOUS | Status: AC | PRN
  Administered 2021-10-28: 9 mL via INTRAVENOUS

## 2021-11-02 ENCOUNTER — Telehealth: Payer: Self-pay | Admitting: Urology

## 2021-11-02 NOTE — Telephone Encounter (Signed)
Pt saw his MRI results on his MyChart and would like to know if he needs to schedule a 3 mth follow up appt. I told him if he wanted to schedule one then he could, but he wanted me to check with Larene Beach or her assistant to see if he needs to be scheduled. He would like a call back.

## 2022-01-19 ENCOUNTER — Other Ambulatory Visit: Payer: Self-pay

## 2022-01-19 ENCOUNTER — Other Ambulatory Visit: Payer: 59

## 2022-01-20 LAB — LIPID PANEL
Cholesterol: 167 mg/dL (ref <200)
HDL: 67 mg/dL (ref >=40)
LDL Cholesterol (Calc): 86 mg/dL (calc)
Non-HDL Cholesterol (Calc): 100 mg/dL (calc) (ref <130)
Total CHOL/HDL Ratio: 2.5 (calc) (ref <5.0)
Triglycerides: 53 mg/dL (ref <150)

## 2022-01-20 LAB — COMPLETE METABOLIC PANEL WITH GFR
AG Ratio: 1.3 (calc) (ref 1.0–2.5)
ALT: 35 U/L (ref 9–46)
AST: 45 U/L — ABNORMAL HIGH (ref 10–35)
Albumin: 4.3 g/dL (ref 3.6–5.1)
Alkaline phosphatase (APISO): 64 U/L (ref 35–144)
BUN: 19 mg/dL (ref 7–25)
CO2: 25 mmol/L (ref 20–32)
Calcium: 9.6 mg/dL (ref 8.6–10.3)
Chloride: 106 mmol/L (ref 98–110)
Creat: 1.06 mg/dL (ref 0.70–1.35)
Globulin: 3.3 g/dL (calc) (ref 1.9–3.7)
Glucose, Bld: 77 mg/dL (ref 65–99)
Potassium: 4 mmol/L (ref 3.5–5.3)
Sodium: 139 mmol/L (ref 135–146)
Total Bilirubin: 0.5 mg/dL (ref 0.2–1.2)
Total Protein: 7.6 g/dL (ref 6.1–8.1)
eGFR: 76 mL/min/{1.73_m2} (ref >=60)

## 2022-01-30 ENCOUNTER — Other Ambulatory Visit: Payer: PRIVATE HEALTH INSURANCE

## 2022-01-30 ENCOUNTER — Other Ambulatory Visit: Payer: Self-pay

## 2022-01-30 DIAGNOSIS — R972 Elevated prostate specific antigen [PSA]: Secondary | ICD-10-CM

## 2022-01-31 LAB — PSA: Prostate Specific Ag, Serum: 7.5 ng/mL — ABNORMAL HIGH (ref 0.0–4.0)

## 2022-02-02 NOTE — Progress Notes (Signed)
02/03/22 9:36 AM   Walter Hebert 08-28-1951 [id number]  Referring provider:  Olin Hauser, DO [address] [place],  [place] [address] Chief Complaint  Patient presents with   Benign Prostatic Hypertrophy    Urological history  1. Elevated PSA -PSA trend             8.3 in 12/2013 - bx positive for HGPIN             5.2 in 10/2014             6.3 in 06/2015             4.5 in 09/2015             4.5 in 11/2015             4.5 in 10/2017 - started finasteride             3.5 in 04/2016             4.2 in 10/2016 - stopped finasteride              4.1 in 04/2017             4.3 in 10/2017             6.2 in 04/2018              MRI negative for high grade cancer              Restart finasteride 5 mg on 07/15/2018             4.6 in 10/2018             4.1 in 01/2019 - No finasteride - stopped it two months ago             5.8 in 08/2019             6.0 in 02/2020             6.1 in 09/2020             6.3 in 03/2021             7.7 in 09/2021             7.5 in 01/2022 - MRI of prostate on 07/11/2018 noted no findings of macroscopic or high-grade prostate carcinoma.  Prostate volume 69 cc.  Moderate benign prostatic hyperplasia, resulting in compression of the peripheral zone.  Bladder wall thickening, suggesting a component of outlet obstruction.  PSAD 0.084.     2. Family history of prostate cancer -brother with fatal prostate cancer   3. BPH with LU TS -I PSS 5/2   HPI: Walter Hebert is a 71 y.o.male who returns today for 3 month follow-up.   He reports that he does not have a weak or split stream. He reports that sometimes when he increases his urine intake he experiences urinary frequency.   He reports that his is traveling to [place] in April and will be back around June.   Patient denies any modifying or aggravating factors.  Patient denies any gross hematuria, dysuria or suprapubic/flank pain.  Patient denies any fevers, chills, nausea or vomiting.    Patient denies any modifying or aggravating factors.  Patient denies any gross hematuria, dysuria or suprapubic/flank pain.  Patient denies any fevers, chills, nausea or vomiting.     IPSS     Row Name 02/03/22 0900  International Prostate Symptom Score   How often have you had the sensation of not emptying your bladder? Less than 1 in 5     How often have you had to urinate less than every two hours? Less than 1 in 5 times     How often have you found you stopped and started again several times when you urinated? Not at All     How often have you found it difficult to postpone urination? Less than 1 in 5 times     How often have you had a weak urinary stream? Not at All     How often have you had to strain to start urination? Not at All     How many times did you typically get up at night to urinate? 2 Times     Total IPSS Score 5       Quality of Life due to urinary symptoms   If you were to spend the rest of your life with your urinary condition just the way it is now how would you feel about that? 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 If you have any questions, ask your nurse or doctor.          multivitamin capsule Take 1 capsule by mouth daily. Reported on 12/24/2015   rosuvastatin 10 MG  tablet Commonly known as: Crestor Take 1 tablet (10 mg total) by mouth at bedtime.   sildenafil 100 MG tablet Commonly known as: VIAGRA Take 1 tablet (100 mg total) by mouth daily as needed for erectile dysfunction. Take two hours prior to intercourse on an empty stomach   VITAMIN C PO Take 1 capsule daily by mouth.   VITAMIN E PO Take by mouth.        Allergies:  Allergies  Allergen Reactions   Chloroquine Itching   Hydroxychloroquine Sulfate Hives    Family History: Family History  Problem Relation Age of Onset   Hyperlipidemia Other    Prostate cancer Brother        DECEASED   Healthy Father    Healthy Mother    Kidney disease Neg Hx     Social History:  reports that he has never smoked. He has never used smokeless tobacco. He reports that he does not drink alcohol and does not use drugs.   Physical Exam: BP (!) 152/76    Pulse (!) 56    Ht 5' 10"  (1.778 m)    Wt 206 lb (93.4 kg)    BMI 29.56 kg/m   Constitutional:  Well nourished. Alert and oriented, No acute distress. HEENT: [place] AT, mask in  place  Trachea midline Cardiovascular: No clubbing, cyanosis, or edema. Respiratory: Normal respiratory effort, no increased work of breathing. GU: No CVA tenderness.  No bladder fullness or masses.  Patient with circumcised phallus.  Urethral meatus is patent.  No penile discharge. No penile lesions or rashes. Scrotum without lesions, cysts, rashes and/or edema.  Testicles are located scrotally bilaterally. No masses are appreciated in the testicles. Left and right epididymis are normal. Rectal: Patient with  normal sphincter tone. Anus and perineum without scarring or rashes. No rectal masses are appreciated. Prostate is approximately 60 + grams, could only palpate the apex and midportion of the gland, no nodules are appreciated. Seminal vesicles could not be palpated. Neurologic: Grossly intact, no focal deficits, moving all 4 extremities. Psychiatric: Normal mood and  affect.   Laboratory Data: Lab Results  Component Value Date   CREATININE 1.06 01/19/2022   Lab Results  Component Value Date   HGBA1C 5.6 10/12/2021   Component     Latest Ref Rng & Units 01/19/2022  Glucose     65 - 99 mg/dL 77  BUN     7 - 25 mg/dL 19  Creatinine     0.70 - 1.35 mg/dL 1.06  eGFR     > OR = 60 mL/min/1.87m 76  BUN/Creatinine Ratio     6 - 22 (calc) NOT APPLICABLE  Sodium     1448- 146 mmol/L 139  Potassium     3.5 - 5.3 mmol/L 4.0  Chloride     98 - 110 mmol/L 106  CO2     20 - 32 mmol/L 25  Calcium     8.6 - 10.3 mg/dL 9.6  Total Protein     6.1 - 8.1 g/dL 7.6  Albumin MSPROF     3.6 - 5.1 g/dL 4.3  Globulin     1.9 - 3.7 g/dL (calc) 3.3  AG Ratio     1.0 - 2.5 (calc) 1.3  Total Bilirubin     0.2 - 1.2 mg/dL 0.5  Alkaline phosphatase (APISO)     35 - 144 U/L 64  AST     10 - 35 U/L 45 (H)  ALT     9 - 46 U/L 35   Component      Prostate Specific Ag, Serum  Latest Ref Rng & Units      0.0 - 4.0 ng/mL  04/23/2017      4.1 (H)  10/22/2017      4.3 (H)  04/17/2018     8:35 AM 6.2 (H)  04/17/2018     8:35 AM 6.0 (H)  11/11/2018      4.6 (H)  02/12/2019      4.1 (H)  08/22/2019      5.8 (H)  03/17/2020      6.0 (H)  09/20/2020      6.1 (H)  03/28/2021      6.3 (H)  09/30/2021      7.7 (H)  01/30/2022      7.5 (H)   Pertinent Imaging: Results for orders placed or performed in visit on 02/03/22  Bladder Scan (Post Void Residual) in office  Result Value Ref Range   Scan Result 179m   Assessment & Plan:    1. Elevated PSA  - PSA stably elevated  - DRE shows enlarged prostate but otherwise benign - We discussed further evaluation with MRI versus prostate biopsy. He would like to undergo a prostate MRI.  -  Prostate MRI; scheduled; will call with results   2. BPH with LUTS - He is voiding adequately today  -continue conservative management, avoiding bladder irritants and timed voiding's -We discussed the pathophysiology of BPH  with LU TS -he is not interested in further treatment of his urinary issues at this time  3. Family history of prostate cancer   -Brother with fatal prostate cancer    Return for pending mri results .  [facility] [address], [place], [place] [address] [phone]  I,Kailey Littlejohn,acting as a scribe for [facility], PA-C.,have documented all relevant documentation on the behalf of Ran Tullis, PA-C,as directed by  [facility], PA-C while in the presence of [place], PA-C.

## 2022-02-03 ENCOUNTER — Encounter: Payer: Self-pay | Admitting: Urology

## 2022-02-03 ENCOUNTER — Ambulatory Visit (INDEPENDENT_AMBULATORY_CARE_PROVIDER_SITE_OTHER): Payer: 59 | Admitting: Urology

## 2022-02-03 ENCOUNTER — Other Ambulatory Visit: Payer: Self-pay

## 2022-02-03 VITALS — BP 152/76 | HR 56 | Ht 70.0 in | Wt 206.0 lb

## 2022-02-03 DIAGNOSIS — N401 Enlarged prostate with lower urinary tract symptoms: Secondary | ICD-10-CM

## 2022-02-03 DIAGNOSIS — R972 Elevated prostate specific antigen [PSA]: Secondary | ICD-10-CM

## 2022-02-03 DIAGNOSIS — N138 Other obstructive and reflux uropathy: Secondary | ICD-10-CM

## 2022-02-03 LAB — BLADDER SCAN AMB NON-IMAGING

## 2022-02-24 ENCOUNTER — Other Ambulatory Visit: Payer: Self-pay

## 2022-02-24 ENCOUNTER — Ambulatory Visit
Admission: RE | Admit: 2022-02-24 | Discharge: 2022-02-24 | Disposition: A | Discharge status: Home / Self Care | Payer: 59 | Source: Ambulatory Visit | Attending: Urology | Admitting: Urology

## 2022-02-24 DIAGNOSIS — N401 Enlarged prostate with lower urinary tract symptoms: Secondary | ICD-10-CM | POA: Insufficient documentation

## 2022-02-24 DIAGNOSIS — R972 Elevated prostate specific antigen [PSA]: Secondary | ICD-10-CM | POA: Diagnosis present

## 2022-02-24 DIAGNOSIS — N138 Other obstructive and reflux uropathy: Secondary | ICD-10-CM | POA: Diagnosis present

## 2022-02-24 IMAGING — MR MR PROSTATE WO/W CM
56 series · 56 of 56 positions shown · IV contrast (9ml Gadavist)
Comparison: [DATE]

CLINICAL DATA: Elevated PSA level of 7.5 on [DATE]. High-grade
PIN in the left mid gland left lateral base on biopsy dated
[DATE]

EXAM:
MR PROSTATE WITHOUT AND WITH CONTRAST
TECHNIQUE: Multiplanar multisequence MRI images were obtained of the pelvis
centered about the prostate. Pre and post contrast images were
obtained.
CONTRAST:  9mL GADAVIST GADOBUTROL 1 MMOL/ML IV SOLN

[Series 3: ax in&out whole · axial · 6.0mm · 0.74mm/px · 1 of 35 slices shown (1 of 2)]
[im 1/35]
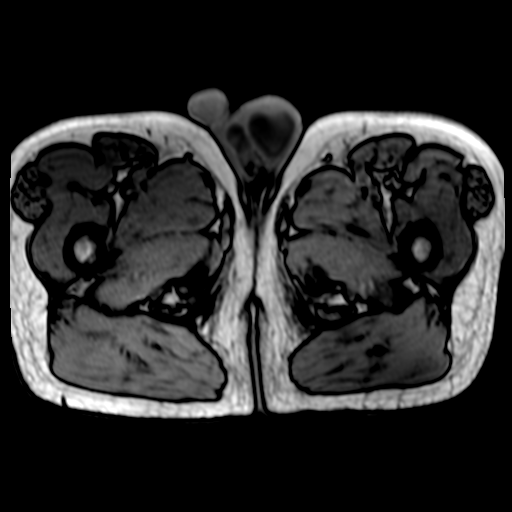

[Series 3: ax in&out whole · axial · 6.0mm · 0.74mm/px · 1 of 35 slices shown (2 of 2)]
[im 1/35]
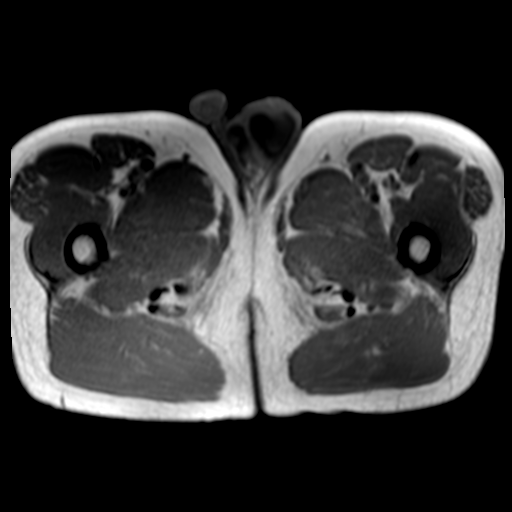

[Series 4: T2 · coronal · 3.0mm · 0.70mm/px · 1 of 35 slices shown (1 of 3)]
[im 1/35]
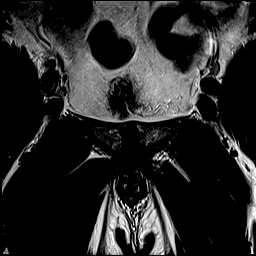

[Series 5: T2 · axial · 3.0mm · 0.56mm/px · 1 of 27 slices shown (2 of 3)]
[im 1/27]
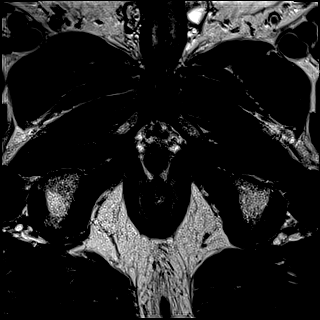

[Series 6: DWI · axial · 3.0mm · 0.86mm/px · 1 of 81 slices shown (1 of 3)]
[im 1/81]
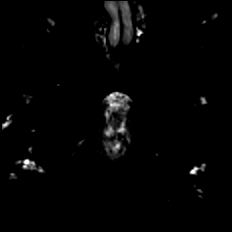

[Series 7: DWI · axial · 3.0mm · 0.86mm/px · 1 of 27 slices shown (2 of 3)]
[im 1/27]
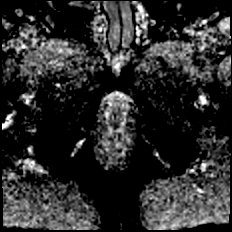

[Series 8: DWI · axial · 3.0mm · 0.86mm/px · 1 of 27 slices shown (3 of 3)]
[im 1/27]
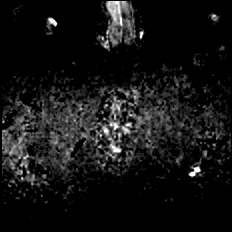

[Series 9: T2 · axial · 1.0mm · 1.04mm/px · 1 of 77 slices shown (3 of 3)]
[im 1/77]
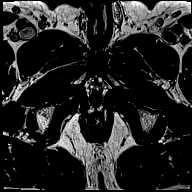

[Series 10: T1 · axial · 3.0mm · 1.15mm/px · 1 of 28 slices shown (1 of 48)]
[im 1/28]
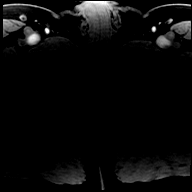

[Series 11: T1 · axial · 3.0mm · 1.15mm/px · 1 of 28 slices shown (2 of 48)]
[im 1/28]
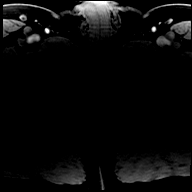

[Series 12: T1 · axial · 3.0mm · 1.15mm/px · 1 of 28 slices shown (3 of 48)]
[im 1/28]
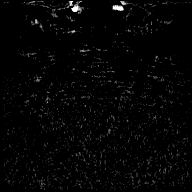

[Series 13: T1 · axial · 3.0mm · 1.15mm/px · 1 of 28 slices shown (4 of 48)]
[im 1/28]
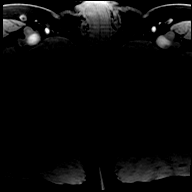

[Series 14: T1 · axial · 3.0mm · 1.15mm/px · 1 of 28 slices shown (5 of 48)]
[im 1/28]
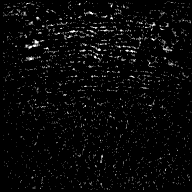

[Series 15: T1 · axial · 3.0mm · 1.15mm/px · 1 of 28 slices shown (6 of 48)]
[im 1/28]
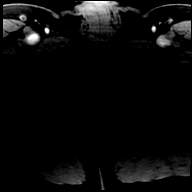

[Series 16: T1 · axial · 3.0mm · 1.15mm/px · 1 of 28 slices shown (7 of 48)]
[im 1/28]
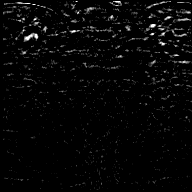

[Series 17: T1 · axial · 3.0mm · 1.15mm/px · 1 of 26 slices shown (8 of 48)]
[im 1/26]
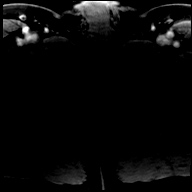

[Series 18: T1 · axial · 3.0mm · 1.15mm/px · 1 of 28 slices shown (9 of 48)]
[im 1/28]
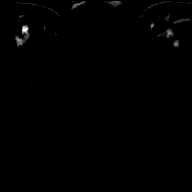

[Series 19: T1 · axial · 3.0mm · 1.15mm/px · 1 of 28 slices shown (10 of 48)]
[im 1/28]
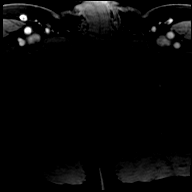

[Series 20: T1 · axial · 3.0mm · 1.15mm/px · 1 of 28 slices shown (11 of 48)]
[im 1/28]
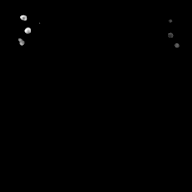

[Series 21: T1 · axial · 3.0mm · 1.15mm/px · 1 of 28 slices shown (12 of 48)]
[im 1/28]
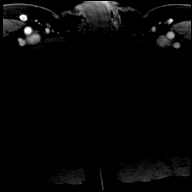

[Series 22: T1 · axial · 3.0mm · 1.15mm/px · 1 of 28 slices shown (13 of 48)]
[im 1/28]
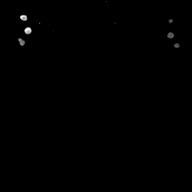

[Series 23: T1 · axial · 3.0mm · 1.15mm/px · 1 of 28 slices shown (14 of 48)]
[im 1/28]
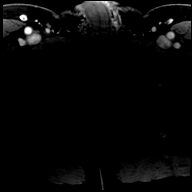

[Series 24: T1 · axial · 3.0mm · 1.15mm/px · 1 of 28 slices shown (15 of 48)]
[im 1/28]
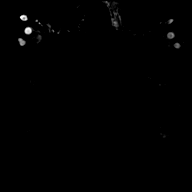

[Series 25: T1 · axial · 3.0mm · 1.15mm/px · 1 of 28 slices shown (16 of 48)]
[im 1/28]
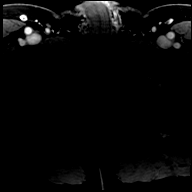

[Series 26: T1 · axial · 3.0mm · 1.15mm/px · 1 of 28 slices shown (17 of 48)]
[im 1/28]
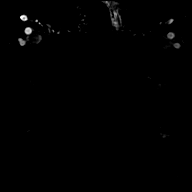

[Series 27: T1 · axial · 3.0mm · 1.15mm/px · 1 of 28 slices shown (18 of 48)]
[im 1/28]
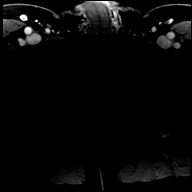

[Series 28: T1 · axial · 3.0mm · 1.15mm/px · 1 of 28 slices shown (19 of 48)]
[im 1/28]
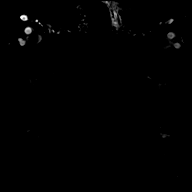

[Series 29: T1 · axial · 3.0mm · 1.15mm/px · 1 of 28 slices shown (20 of 48)]
[im 1/28]
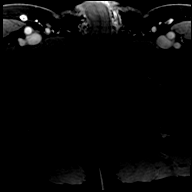

[Series 30: T1 · axial · 3.0mm · 1.15mm/px · 1 of 28 slices shown (21 of 48)]
[im 1/28]
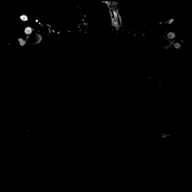

[Series 31: T1 · axial · 3.0mm · 1.15mm/px · 1 of 28 slices shown (22 of 48)]
[im 1/28]
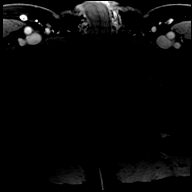

[Series 32: T1 · axial · 3.0mm · 1.15mm/px · 1 of 28 slices shown (23 of 48)]
[im 1/28]
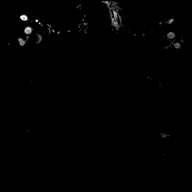

[Series 33: T1 · axial · 3.0mm · 1.15mm/px · 1 of 28 slices shown (24 of 48)]
[im 1/28]
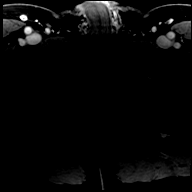

[Series 34: T1 · axial · 3.0mm · 1.15mm/px · 1 of 28 slices shown (25 of 48)]
[im 1/28]
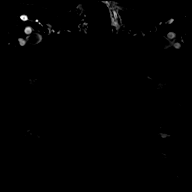

[Series 35: T1 · axial · 3.0mm · 1.15mm/px · 1 of 28 slices shown (26 of 48)]
[im 1/28]
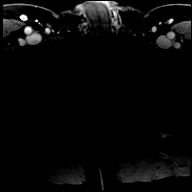

[Series 36: T1 · axial · 3.0mm · 1.15mm/px · 1 of 28 slices shown (27 of 48)]
[im 1/28]
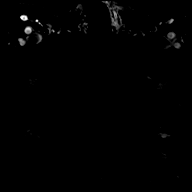

[Series 37: T1 · axial · 3.0mm · 1.15mm/px · 1 of 28 slices shown (28 of 48)]
[im 1/28]
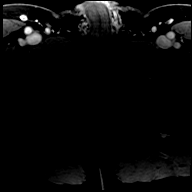

[Series 38: T1 · axial · 3.0mm · 1.15mm/px · 1 of 28 slices shown (29 of 48)]
[im 1/28]
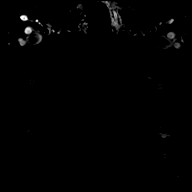

[Series 39: T1 · axial · 3.0mm · 1.15mm/px · 1 of 28 slices shown (30 of 48)]
[im 1/28]
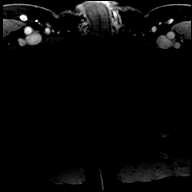

[Series 40: T1 · axial · 3.0mm · 1.15mm/px · 1 of 28 slices shown (31 of 48)]
[im 1/28]
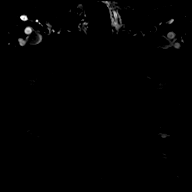

[Series 41: T1 · axial · 3.0mm · 1.15mm/px · 1 of 28 slices shown (32 of 48)]
[im 1/28]
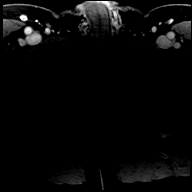

[Series 42: T1 · axial · 3.0mm · 1.15mm/px · 1 of 28 slices shown (33 of 48)]
[im 1/28]
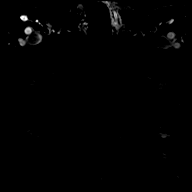

[Series 43: T1 · axial · 3.0mm · 1.15mm/px · 1 of 28 slices shown (34 of 48)]
[im 1/28]
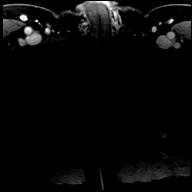

[Series 44: T1 · axial · 3.0mm · 1.15mm/px · 1 of 28 slices shown (35 of 48)]
[im 1/28]
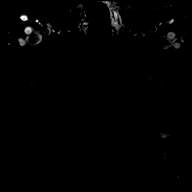

[Series 45: T1 · axial · 3.0mm · 1.15mm/px · 1 of 28 slices shown (36 of 48)]
[im 1/28]
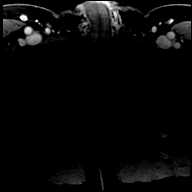

[Series 46: T1 · axial · 3.0mm · 1.15mm/px · 1 of 28 slices shown (37 of 48)]
[im 1/28]
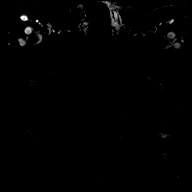

[Series 47: T1 · axial · 3.0mm · 1.15mm/px · 1 of 28 slices shown (38 of 48)]
[im 1/28]
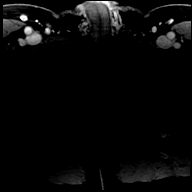

[Series 48: T1 · axial · 3.0mm · 1.15mm/px · 1 of 28 slices shown (39 of 48)]
[im 1/28]
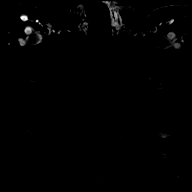

[Series 49: T1 · axial · 3.0mm · 1.15mm/px · 1 of 28 slices shown (40 of 48)]
[im 1/28]
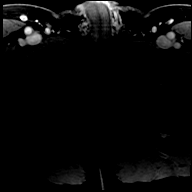

[Series 50: T1 · axial · 3.0mm · 1.15mm/px · 1 of 28 slices shown (41 of 48)]
[im 1/28]
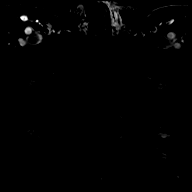

[Series 51: T1 · axial · 3.0mm · 1.15mm/px · 1 of 28 slices shown (42 of 48)]
[im 1/28]
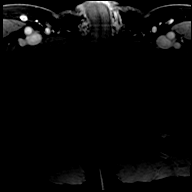

[Series 52: T1 · axial · 3.0mm · 1.15mm/px · 1 of 28 slices shown (43 of 48)]
[im 1/28]
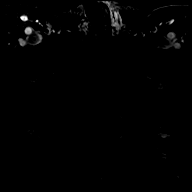

[Series 53: T1 · axial · 3.0mm · 1.15mm/px · 1 of 28 slices shown (44 of 48)]
[im 1/28]
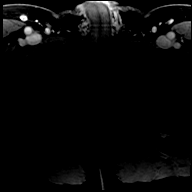

[Series 54: T1 · axial · 3.0mm · 1.15mm/px · 1 of 28 slices shown (45 of 48)]
[im 1/28]
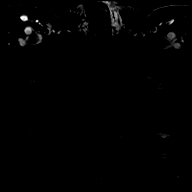

[Series 55: T1 · axial · 3.0mm · 1.15mm/px · 1 of 28 slices shown (46 of 48)]
[im 1/28]
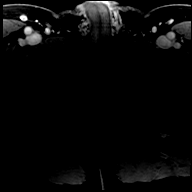

[Series 56: T1 · axial · 3.0mm · 1.15mm/px · 1 of 28 slices shown (47 of 48)]
[im 1/28]
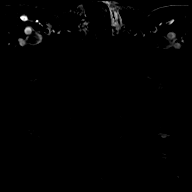

[Series 57: T1 · axial · 3.0mm · 1.15mm/px · 1 of 28 slices shown (48 of 48)]
[im 1/28]
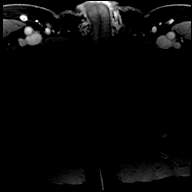

[56 of 56 positions shown; findings below may reference images not displayed]

FINDINGS: Prostate:

There has been some interval thinning of the parenchyma of the right
peripheral zone compared to the prior exam.

Region of interest # 1: PI-RADS category 3 lesion of the left
posteromedial peripheral zone in the mid gland, with focally reduced
T2 signal (image 50, series 9). This measures 0.18 cc (0.9 by 0.4 by
0.5 cm) and is roughly stable compared to the [DATE]
examination.

Region of interest # 2: PI-RADS category 4 lesion of the right
posterolateral peripheral zone in the mid gland and apex, with
focally reduced T2 signal (image 58, series 9) and mild asymmetric
focal early enhancement (image 18, series 2). There is some moderate
restriction of diffusion in this vicinity on image 20 series 8. ADC
map activity reduction is only mild. This lesion measures 0.66 cc
(1.7 by 0.4 by 1.3 cm) and is newly appreciable compared to the
[DATE] exam.

Encapsulated nodularity in the transition zone compatible with
benign prostatic hypertrophy.

Volume: 3D volumetric analysis: Prostate volume 84.11 cc (5.8 by
by 5.8 cm).

Transcapsular spread: Not directly visualized, although region of
interest # 2 has greater than 1.5 cm of capsular contact.

Seminal vesicle involvement: Absent

Neurovascular bundle involvement: Absent

Pelvic adenopathy: Absent. A 0.8 cm right external iliac node on
image 8 series 5 is within normal size limits, and previously
measured 0.7 cm.

Bone metastasis: Absent

Other findings: Small left scrotal hydrocele.
IMPRESSION: 1. New PI-RADS category 4 lesion in the right posterolateral
peripheral zone in the mid gland and apex. Stable PI-RADS category 3
lesion of the left posteromedial peripheral zone in the mid gland.
Targeting data sent to [facility].
2. Prostatomegaly and benign prostatic hypertrophy.
3. Small left scrotal hydrocele.

## 2022-02-24 MED ORDER — GADOBUTROL 1 MMOL/ML IV SOLN
9.0000 mL | Freq: Once | INTRAVENOUS | Status: AC | PRN
  Administered 2022-02-24: 9 mL via INTRAVENOUS

## 2022-02-27 ENCOUNTER — Other Ambulatory Visit: Payer: Self-pay | Admitting: Urology

## 2022-02-27 DIAGNOSIS — R9389 Abnormal findings on diagnostic imaging of other specified body structures: Secondary | ICD-10-CM

## 2022-02-27 DIAGNOSIS — R972 Elevated prostate specific antigen [PSA]: Secondary | ICD-10-CM

## 2022-02-27 NOTE — Progress Notes (Unsigned)
I have referred Walter Hebert to [facility] in [place] for a fusion biopsy of his prostate.  ?

## 2022-03-06 ENCOUNTER — Other Ambulatory Visit: Payer: Self-pay

## 2022-03-06 ENCOUNTER — Encounter: Payer: Self-pay | Admitting: Urology

## 2022-03-06 ENCOUNTER — Ambulatory Visit: Payer: 59 | Admitting: Urology

## 2022-03-06 VITALS — BP 147/75 | HR 57 | Ht 70.0 in | Wt 202.0 lb

## 2022-03-06 DIAGNOSIS — R972 Elevated prostate specific antigen [PSA]: Secondary | ICD-10-CM | POA: Diagnosis not present

## 2022-03-06 DIAGNOSIS — N4231 Prostatic intraepithelial neoplasia: Secondary | ICD-10-CM

## 2022-03-06 MED ORDER — GENTAMICIN SULFATE 40 MG/ML IJ SOLN
80.0000 mg | Freq: Once | INTRAMUSCULAR | Status: AC
  Administered 2022-03-06: 80 mg via INTRAMUSCULAR

## 2022-03-06 MED ORDER — LEVOFLOXACIN 500 MG PO TABS
500.0000 mg | ORAL_TABLET | Freq: Once | ORAL | Status: AC
Start: 2022-03-06 — End: 2022-03-06
  Administered 2022-03-06: 500 mg via ORAL

## 2022-03-06 NOTE — Progress Notes (Signed)
Prostate Biopsy Procedure  ? ?Informed consent was obtained after discussing risks/benefits of the procedure.  A time out was performed to ensure correct patient identity.  Fusion biopsy unable to be performed in [place] due to insurance ? ?Pre-Procedure: ?- Last PSA Level: Rising PSA 7.5-01/2022 ?- Gentamicin given prophylactically ?- Levaquin 500 mg administered PO ?-Transrectal Ultrasound performed revealing a 75 gm prostate ?-Hypoechoic PZ right mid gland ? ?Procedure: ?- Prostate block performed using 10 cc 1% lidocaine and biopsies taken from sextant areas, a total of 12 under ultrasound guidance.  12 core biopsies included cognitive biopsies ? ?Post-Procedure: ?- Patient tolerated the procedure well ?- He was counseled to seek immediate medical attention if experiences any severe pain, significant bleeding, or fevers ?-Will call with biopsy results ? ? ?John Giovanni, MD ? ?

## 2022-03-08 LAB — SURGICAL PATHOLOGY

## 2022-03-09 ENCOUNTER — Encounter: Payer: Self-pay | Admitting: Urology

## 2022-03-15 ENCOUNTER — Ambulatory Visit: Payer: 59 | Admitting: Urology

## 2022-03-15 ENCOUNTER — Other Ambulatory Visit: Payer: Self-pay

## 2022-03-15 ENCOUNTER — Telehealth: Payer: 59 | Admitting: Urology

## 2022-03-31 ENCOUNTER — Other Ambulatory Visit: Payer: 59

## 2022-04-04 ENCOUNTER — Ambulatory Visit: Payer: 59 | Admitting: Urology

## 2022-04-10 ENCOUNTER — Other Ambulatory Visit: Payer: Self-pay | Admitting: Family Medicine

## 2022-04-10 DIAGNOSIS — Z298 Encounter for other specified prophylactic measures: Secondary | ICD-10-CM

## 2022-04-10 MED ORDER — DOXYCYCLINE HYCLATE 100 MG PO TABS: ORAL_TABLET | ORAL | 1 refills | Status: DC

## 2022-04-18 ENCOUNTER — Ambulatory Visit: Payer: 59 | Admitting: Family Medicine

## 2022-05-23 ENCOUNTER — Ambulatory Visit: Payer: 59 | Admitting: Family Medicine

## 2022-05-23 ENCOUNTER — Encounter: Payer: Self-pay | Admitting: Family Medicine

## 2022-05-23 ENCOUNTER — Other Ambulatory Visit: Payer: Self-pay | Admitting: Family Medicine

## 2022-05-23 VITALS — BP 130/68 | HR 52 | Ht 70.0 in | Wt 213.6 lb

## 2022-05-23 DIAGNOSIS — N401 Enlarged prostate with lower urinary tract symptoms: Secondary | ICD-10-CM | POA: Diagnosis not present

## 2022-05-23 DIAGNOSIS — R7309 Other abnormal glucose: Secondary | ICD-10-CM

## 2022-05-23 DIAGNOSIS — E7800 Pure hypercholesterolemia, unspecified: Secondary | ICD-10-CM

## 2022-05-23 DIAGNOSIS — Z0000 Encounter for general adult medical examination without abnormal findings: Secondary | ICD-10-CM

## 2022-05-23 DIAGNOSIS — N138 Other obstructive and reflux uropathy: Secondary | ICD-10-CM

## 2022-05-23 DIAGNOSIS — R030 Elevated blood-pressure reading, without diagnosis of hypertension: Secondary | ICD-10-CM

## 2022-05-23 DIAGNOSIS — E669 Obesity, unspecified: Secondary | ICD-10-CM

## 2022-05-23 LAB — POCT GLYCOSYLATED HEMOGLOBIN (HGB A1C): Hemoglobin A1C: 5.7 % — AB (ref 4.0–5.6)

## 2022-05-23 NOTE — Patient Instructions (Addendum)
Thank you for coming to the office today.  Recent Labs    10/12/21 0821 05/23/22 1011  HGBA1C 5.6 5.7*    A1c is slightly elevated at 5.7, this is early Pre Diabetes range again. Not too bad, goal to keep improving lifestyle as we discussed today  Mild elevated Liver Enzymes DUE TO Statin cholesterol medication. This does not mean that your liver is damaged, this is normal on the medication.  Cholesterol has improved  DUE for FASTING BLOOD WORK (no food or drink after midnight before the lab appointment, only water or coffee without cream/sugar on the morning of)  SCHEDULE "Lab Only" visit in the morning at the clinic for lab draw in 5 MONTHS   - Make sure Lab Only appointment is at about 1 week before your next appointment, so that results will be available  For Lab Results, once available within 2-3 days of blood draw, you can can log in to MyChart online to view your results and a brief explanation. Also, we can discuss results at next follow-up visit.    Please schedule a Follow-up Appointment to: Return in about 5 months (around 10/23/2022) for 5 month fasting lab only then 1 week later Annual Physical.  If you have any other questions or concerns, please feel free to call the office or send a message through [place]. You may also schedule an earlier appointment if necessary.  Additionally, you may be receiving a survey about your experience at our office within a few days to 1 week by e-mail or mail. We value your feedback.  Nobie Putnam, DO [place]

## 2022-05-23 NOTE — Assessment & Plan Note (Signed)
A1c up to 5.7 Recommend lifestyle modification to manage and control Pre DM  Resume exercise diet regimen

## 2022-05-23 NOTE — Progress Notes (Signed)
Subjective:    Patient ID: Walter Hebert, male    DOB: Feb 13, 1951, 71 y.o.   MRN: [id number]  Walter Hebert is a 71 y.o. male presenting on 05/23/2022 for Benign Prostatic Hypertrophy   HPI  Elevated A1c / Obesity BMI >31 / Elevated BP without HTN A1c down to 5.6 Weight up to 11 lbs in past 3 months He was in [place] for family funeral, sister and brother passed. He was not able to maintain exercise and activity Today reports doing well on improved lifestyle still Lifestyle - Diet: Continues healthy diet, still admits eating snack or meal in PM avoid late night large meal before bed. Reduced starches / carbs and portions. Drinks water mostly, also warm water with lemon/lime in AM - Exercise: Goal to resume exercise Denies hypoglycemia  LFT Elevation Due to statin use last lab showed 01/2022 with LFT AST 45 mild elevated and ALT 35. Compared to previous.   BPH LUTS / History of High Grade Prostate Intraepithelial Neoplasia Followed by [facility], prior biopsy, see their records. Last PSA 7.5 range Prostate biopsy has shown high grade intraepithelial neoplasia but no confirmed cancer Repeat PSA in 6 months.   HYPERLIPIDEMIA Last lipid 01/2022 improved Lipids LDL on Statin Continue Rosuvastatin '10mg'$  nightly      05/23/2022    9:55 AM 04/13/2021    8:01 AM 10/13/2020    8:46 AM  Depression screen PHQ 2/9  Decreased Interest 0 0 0  Down, Depressed, Hopeless 0 0 0  PHQ - 2 Score 0 0 0    Social History   Tobacco Use   Smoking status: Never   Smokeless tobacco: Never  Vaping Use   Vaping Use: Never used  Substance Use Topics   Alcohol use: No    Comment: Quit- 1990   Drug use: No    Review of Systems Per HPI unless specifically indicated above     Objective:    BP 130/68   Pulse (!) 52   Ht '5\' 10"'$  (1.778 m)   Wt 213 lb 9.6 oz (96.9 kg)   SpO2 99%   BMI 30.65 kg/m   Wt Readings from Last 3 Encounters:  05/23/22 213 lb 9.6 oz (96.9 kg)  03/06/22 202 lb (91.6 kg)   02/03/22 206 lb (93.4 kg)    Physical Exam Vitals and nursing note reviewed.  Constitutional:      General: He is not in acute distress.    Appearance: He is well-developed. He is not diaphoretic.     Comments: Well-appearing, comfortable, cooperative  HENT:     Head: Normocephalic and atraumatic.  Eyes:     General:        Right eye: No discharge.        Left eye: No discharge.     Conjunctiva/sclera: Conjunctivae normal.  Neck:     Thyroid: No thyromegaly.  Cardiovascular:     Rate and Rhythm: Normal rate and regular rhythm.     Pulses: Normal pulses.     Heart sounds: Normal heart sounds. No murmur heard. Pulmonary:     Effort: Pulmonary effort is normal. No respiratory distress.     Breath sounds: Normal breath sounds. No wheezing or rales.  Musculoskeletal:        General: Normal range of motion.     Cervical back: Normal range of motion and neck supple.  Lymphadenopathy:     Cervical: No cervical adenopathy.  Skin:    General: Skin is warm  and dry.     Findings: No erythema or rash.  Neurological:     Mental Status: He is alert and oriented to person, place, and time. Mental status is at baseline.  Psychiatric:        Behavior: Behavior normal.     Comments: Well groomed, good eye contact, normal speech and thoughts      Results for orders placed or performed in visit on 05/23/22  POCT glycosylated hemoglobin (Hb A1C)  Result Value Ref Range   Hemoglobin A1C 5.7 (A) 4.0 - 5.6 %      Assessment & Plan:   Problem List Items Addressed This Visit     Hyperlipidemia    Continue on statin, has improved LDL       Elevated hemoglobin A1c - Primary    A1c up to 5.7 Recommend lifestyle modification to manage and control Pre DM  Resume exercise diet regimen       Relevant Orders   POCT glycosylated hemoglobin (Hb A1C) (Completed)   BPH with obstruction/lower urinary tract symptoms    Followed by [facility] Prior prostate biopsy, last with abnormal but no  cancer identified Elevated PSA Next visit 08/2022 w/ urology they will repeat PSA and monitor        No orders of the defined types were placed in this encounter.     Follow up plan: Return in about 5 months (around 10/23/2022) for 5 month fasting lab only then 1 week later Annual Physical.  Future labs ordered for 09/2022   Nobie Putnam, [facility] 05/23/2022, 9:47 AM

## 2022-05-23 NOTE — Assessment & Plan Note (Signed)
Followed by [facility] Prior prostate biopsy, last with abnormal but no cancer identified Elevated PSA Next visit 08/2022 w/ urology they will repeat PSA and monitor

## 2022-05-23 NOTE — Assessment & Plan Note (Signed)
Continue on statin, has improved LDL

## 2022-09-01 ENCOUNTER — Ambulatory Visit (INDEPENDENT_AMBULATORY_CARE_PROVIDER_SITE_OTHER): Payer: Medicare Other

## 2022-09-01 DIAGNOSIS — Z23 Encounter for immunization: Secondary | ICD-10-CM | POA: Diagnosis not present

## 2022-09-05 ENCOUNTER — Other Ambulatory Visit: Payer: Self-pay | Admitting: *Deleted

## 2022-09-05 DIAGNOSIS — R972 Elevated prostate specific antigen [PSA]: Secondary | ICD-10-CM

## 2022-09-14 ENCOUNTER — Encounter: Payer: Self-pay | Admitting: Urology

## 2022-09-14 ENCOUNTER — Other Ambulatory Visit: Payer: Medicare Other

## 2022-09-15 ENCOUNTER — Ambulatory Visit: Payer: Medicare Other | Admitting: Urology

## 2022-09-19 ENCOUNTER — Other Ambulatory Visit: Payer: 59

## 2022-09-21 ENCOUNTER — Other Ambulatory Visit: Payer: Self-pay

## 2022-09-21 DIAGNOSIS — R7309 Other abnormal glucose: Secondary | ICD-10-CM

## 2022-09-21 DIAGNOSIS — Z0000 Encounter for general adult medical examination without abnormal findings: Secondary | ICD-10-CM

## 2022-09-21 DIAGNOSIS — E7800 Pure hypercholesterolemia, unspecified: Secondary | ICD-10-CM

## 2022-09-21 DIAGNOSIS — E669 Obesity, unspecified: Secondary | ICD-10-CM

## 2022-09-21 DIAGNOSIS — R030 Elevated blood-pressure reading, without diagnosis of hypertension: Secondary | ICD-10-CM

## 2022-09-22 ENCOUNTER — Other Ambulatory Visit: Payer: 59

## 2022-09-22 DIAGNOSIS — R7309 Other abnormal glucose: Secondary | ICD-10-CM | POA: Diagnosis not present

## 2022-09-22 DIAGNOSIS — R030 Elevated blood-pressure reading, without diagnosis of hypertension: Secondary | ICD-10-CM | POA: Diagnosis not present

## 2022-09-22 DIAGNOSIS — E7800 Pure hypercholesterolemia, unspecified: Secondary | ICD-10-CM | POA: Diagnosis not present

## 2022-09-22 DIAGNOSIS — Z0000 Encounter for general adult medical examination without abnormal findings: Secondary | ICD-10-CM | POA: Diagnosis not present

## 2022-09-26 ENCOUNTER — Ambulatory Visit (INDEPENDENT_AMBULATORY_CARE_PROVIDER_SITE_OTHER): Payer: Medicare Other | Admitting: Family Medicine

## 2022-09-26 ENCOUNTER — Other Ambulatory Visit: Payer: Self-pay | Admitting: Family Medicine

## 2022-09-26 ENCOUNTER — Encounter: Payer: Self-pay | Admitting: Family Medicine

## 2022-09-26 VITALS — BP 128/78 | HR 56 | Ht 70.0 in | Wt 212.2 lb

## 2022-09-26 DIAGNOSIS — N401 Enlarged prostate with lower urinary tract symptoms: Secondary | ICD-10-CM

## 2022-09-26 DIAGNOSIS — E669 Obesity, unspecified: Secondary | ICD-10-CM | POA: Diagnosis not present

## 2022-09-26 DIAGNOSIS — R030 Elevated blood-pressure reading, without diagnosis of hypertension: Secondary | ICD-10-CM

## 2022-09-26 DIAGNOSIS — E7800 Pure hypercholesterolemia, unspecified: Secondary | ICD-10-CM | POA: Diagnosis not present

## 2022-09-26 DIAGNOSIS — R7309 Other abnormal glucose: Secondary | ICD-10-CM

## 2022-09-26 DIAGNOSIS — Z0000 Encounter for general adult medical examination without abnormal findings: Secondary | ICD-10-CM | POA: Diagnosis not present

## 2022-09-26 DIAGNOSIS — N138 Other obstructive and reflux uropathy: Secondary | ICD-10-CM | POA: Diagnosis not present

## 2022-09-26 LAB — CBC WITH DIFFERENTIAL/PLATELET
Absolute Monocytes: 277 cells/uL (ref 200–950)
Basophils Absolute: 28 cells/uL (ref 0–200)
Basophils Relative: 0.8 %
Eosinophils Absolute: 49 cells/uL (ref 15–500)
Eosinophils Relative: 1.4 %
HCT: 50.6 % — ABNORMAL HIGH (ref 38.5–50.0)
Hemoglobin: 15.5 g/dL (ref 13.2–17.1)
Lymphs Abs: 1915 cells/uL (ref 850–3900)
MCH: 24.4 pg — ABNORMAL LOW (ref 27.0–33.0)
MCHC: 30.6 g/dL — ABNORMAL LOW (ref 32.0–36.0)
MCV: 79.8 fL — ABNORMAL LOW (ref 80.0–100.0)
MPV: 11.5 fL (ref 7.5–12.5)
Monocytes Relative: 7.9 %
Neutro Abs: 1232 cells/uL — ABNORMAL LOW (ref 1500–7800)
Neutrophils Relative %: 35.2 %
Platelets: 180 10*3/uL (ref 140–400)
RBC: 6.34 10*6/uL — ABNORMAL HIGH (ref 4.20–5.80)
RDW: 15.3 % — ABNORMAL HIGH (ref 11.0–15.0)
Total Lymphocyte: 54.7 %
WBC: 3.5 10*3/uL — ABNORMAL LOW (ref 3.8–10.8)

## 2022-09-26 LAB — COMPLETE METABOLIC PANEL WITH GFR
AG Ratio: 1.4 (calc) (ref 1.0–2.5)
ALT: 25 U/L (ref 9–46)
AST: 25 U/L (ref 10–35)
Albumin: 4.4 g/dL (ref 3.6–5.1)
Alkaline phosphatase (APISO): 64 U/L (ref 35–144)
BUN: 11 mg/dL (ref 7–25)
CO2: 28 mmol/L (ref 20–32)
Calcium: 9.6 mg/dL (ref 8.6–10.3)
Chloride: 104 mmol/L (ref 98–110)
Creat: 0.94 mg/dL (ref 0.70–1.35)
Globulin: 3.1 g/dL (calc) (ref 1.9–3.7)
Glucose, Bld: 84 mg/dL (ref 65–99)
Potassium: 4.2 mmol/L (ref 3.5–5.3)
Sodium: 140 mmol/L (ref 135–146)
Total Bilirubin: 0.6 mg/dL (ref 0.2–1.2)
Total Protein: 7.5 g/dL (ref 6.1–8.1)
eGFR: 88 mL/min/{1.73_m2} (ref >=60)

## 2022-09-26 LAB — LIPID PANEL
Cholesterol: 222 mg/dL — ABNORMAL HIGH (ref <200)
HDL: 71 mg/dL (ref >=40)
LDL Cholesterol (Calc): 131 mg/dL (calc) — ABNORMAL HIGH
Non-HDL Cholesterol (Calc): 151 mg/dL (calc) — ABNORMAL HIGH (ref <130)
Total CHOL/HDL Ratio: 3.1 (calc) (ref <5.0)
Triglycerides: 102 mg/dL (ref <150)

## 2022-09-26 LAB — TSH: TSH: 2.14 mIU/L (ref 0.40–4.50)

## 2022-09-26 LAB — HEMOGLOBIN A1C
Hgb A1c MFr Bld: 5.7 % of total Hgb — ABNORMAL HIGH (ref <5.7)
Mean Plasma Glucose: 117 mg/dL
eAG (mmol/L): 6.5 mmol/L

## 2022-09-26 MED ORDER — ROSUVASTATIN CALCIUM 5 MG PO TABS: 5.0000 mg | ORAL_TABLET | Freq: Every day | ORAL | 3 refills | Status: DC

## 2022-09-26 NOTE — Assessment & Plan Note (Signed)
A1c up to 5.7, stable Recommend lifestyle modification to manage and control Pre DM

## 2022-09-26 NOTE — Progress Notes (Signed)
Subjective:    Patient ID: Walter Hebert, male    DOB: 29-Oct-1951, 71 y.o.   MRN: [id number]  Walter Hebert is a 71 y.o. male presenting on 09/26/2022 for Annual Exam   HPI  Here for Annual Physical and Lab Review  Elevated A1c / Obesity BMI >30 / Elevated BP without HTN Last lab A1c 5.7  Weight up to 11 lbs in past 3 months He was in [place] for family funeral, sister and brother passed. He was not able to maintain exercise and activity Today reports doing well on improved lifestyle still Lifestyle - Diet: Continues healthy diet, still admits eating snack or meal in PM avoid late night large meal before bed. Reduced starches / carbs and portions. 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 Last PSA 7.5 range Prostate biopsy has shown high grade intraepithelial neoplasia but no confirmed cancer Repeat PSA in 6 months.   HYPERLIPIDEMIA Last lipid 09/2022, elevated LDL up to 130s again off Statin now, he ran out of med OFF Rosuvastatin 64m nightly, no myalgia, just ran out and now cholesterol raised, and he had elevated LFT before on med   Health Maintenance:  Updated Flu Shot Considering COVID  Colonoscopy last done 05/18/15, negative good for 10 yr, next 2026     09/26/2022    9:54 AM 05/23/2022    9:55 AM 04/13/2021    8:01 AM  Depression screen PHQ 2/9  Decreased Interest 0 0 0  Down, Depressed, Hopeless 0 0 0  PHQ - 2 Score 0 0 0  Altered sleeping 0    Tired, decreased energy 0    Change in appetite 0    Feeling bad or failure about yourself  0    Trouble concentrating 0    Moving slowly or fidgety/restless 0    Suicidal thoughts 0     PHQ-9 Score 0    Difficult doing work/chores Not difficult at all      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 on biopsy may 2015   Night sweats    Synovial cyst    Past Surgical History:  Procedure Laterality Date   COLONOSCOPY N/A 05/18/2015   Procedure: COLONOSCOPY;  Surgeon: DLucilla Lame MD;  Location: [facility] ENDOSCOPY;  Service: Endoscopy;  Laterality: N/A;   Social History   Socioeconomic History   Marital status: Married    Spouse name: Not on file   Number of children: Not on file   Years of education: Not on file   Highest education level: Not on file  Occupational History   Not on file  Tobacco Use   Smoking status: Never   Smokeless tobacco: Never  Vaping Use   Vaping Use: Never used  Substance and Sexual Activity   Alcohol use: No    Comment: Quit- 1990   Drug use: No   Sexual activity: Not on file  Other Topics Concern   Not on file  Social History Narrative   Not on file   Social  Determinants of Health   Financial Resource Strain: Not on file  Food Insecurity: Not on file  Transportation Needs: Not on file  Physical Activity: Not on file  Stress: Not on file  Social Connections: Not on file  Intimate Partner Violence: Not on file   Family History  Problem Relation Age of Onset   Hyperlipidemia Other    Prostate cancer Brother        DECEASED   Healthy Father    Healthy Mother    Kidney disease Neg Hx    Current Outpatient Medications on File Prior to Visit  Medication Sig   Ascorbic Acid (VITAMIN C PO) Take 1 capsule daily by mouth.    Multiple Vitamin (MULTIVITAMIN) capsule Take 1 capsule by mouth daily. Reported on 12/24/2015   sildenafil (VIAGRA) 100 MG tablet Take 1 tablet (100 mg total) by mouth daily as needed for erectile dysfunction. Take two hours prior to intercourse on an empty stomach   VITAMIN E PO Take by mouth.   No  current facility-administered medications on file prior to visit.    Review of Systems  Constitutional:  Negative for activity change, appetite change, chills, diaphoresis, fatigue and fever.  HENT:  Negative for congestion and hearing loss.   Eyes:  Negative for visual disturbance.  Respiratory:  Negative for cough, chest tightness, shortness of breath and wheezing.   Cardiovascular:  Negative for chest pain, palpitations and leg swelling.  Gastrointestinal:  Negative for abdominal pain, constipation, diarrhea, nausea and vomiting.  Genitourinary:  Negative for dysuria, frequency and hematuria.  Musculoskeletal:  Negative for arthralgias and neck pain.  Skin:  Negative for rash.  Neurological:  Negative for dizziness, weakness, light-headedness, numbness and headaches.  Hematological:  Negative for adenopathy.  Psychiatric/Behavioral:  Negative for behavioral problems, dysphoric mood and sleep disturbance.    Per HPI unless specifically indicated above      Objective:    BP 128/78 (BP Location: Left Arm, Cuff Size: Normal)   Pulse (!) 56   Ht _0  (1.778 m)   Wt 212 lb 3.2 oz (96.3 kg)   SpO2 100%   BMI 30.45 kg/m   Wt Readings from Last 3 Encounters:  09/26/22 212 lb 3.2 oz (96.3 kg)  05/23/22 213 lb 9.6 oz (96.9 kg)  03/06/22 202 lb (91.6 kg)    Physical Exam Vitals and nursing note reviewed.  Constitutional:      General: He is not in acute distress.    Appearance: He is well-developed. He is not diaphoretic.     Comments: Well-appearing, comfortable, cooperative  HENT:     Head: Normocephalic and atraumatic.  Eyes:     General:        Right eye: No discharge.        Left eye: No discharge.     Conjunctiva/sclera: Conjunctivae normal.     Pupils: Pupils are equal, round, and reactive to light.  Neck:     Thyroid: No thyromegaly.     Vascular: No carotid bruit.  Cardiovascular:     Rate and Rhythm: Normal rate and regular rhythm.     Pulses: Normal pulses.      Heart sounds: Normal heart sounds. No murmur heard. Pulmonary:     Effort: Pulmonary effort is normal. No respiratory distress.     Breath sounds: Normal breath sounds. No wheezing or rales.  Abdominal:     General: Bowel sounds are normal. There is no distension.     Palpations: Abdomen is  soft. There is no mass.     Tenderness: There is no abdominal tenderness.  Musculoskeletal:        General: No tenderness. Normal range of motion.     Cervical back: Normal range of motion and neck supple.     Right lower leg: No edema.     Left lower leg: No edema.     Comments: Upper / Lower Extremities: - Normal muscle tone, strength bilateral upper extremities 5/5, lower extremities 5/5  Lymphadenopathy:     Cervical: No cervical adenopathy.  Skin:    General: Skin is warm and dry.     Findings: No erythema or rash.  Neurological:     Mental Status: He is alert and oriented to person, place, and time.     Comments: Distal sensation intact to light touch all extremities  Psychiatric:        Mood and Affect: Mood normal.        Behavior: Behavior normal.        Thought Content: Thought content normal.     Comments: Well groomed, good eye contact, normal speech and thoughts    Results for orders placed or performed in visit on 09/21/22  TSH  Result Value Ref Range   TSH 2.14 0.40 - 4.50 mIU/L  Hemoglobin A1c  Result Value Ref Range   Hgb A1c MFr Bld 5.7 (H) <5.7 % of total Hgb   Mean Plasma Glucose 117 mg/dL   eAG (mmol/L) 6.5 mmol/L  Lipid panel  Result Value Ref Range   Cholesterol 222 (H) <200 mg/dL   HDL 71 > OR = 40 mg/dL   Triglycerides 102 <150 mg/dL   LDL Cholesterol (Calc) 131 (H) mg/dL (calc)   Total CHOL/HDL Ratio 3.1 <5.0 (calc)   Non-HDL Cholesterol (Calc) 151 (H) <130 mg/dL (calc)  CBC with Differential/Platelet  Result Value Ref Range   WBC 3.5 (L) 3.8 - 10.8 Thousand/uL   RBC 6.34 (H) 4.20 - 5.80 Million/uL   Hemoglobin 15.5 13.2 - 17.1 g/dL   HCT 50.6 (H)  38.5 - 50.0 %   MCV 79.8 (L) 80.0 - 100.0 fL   MCH 24.4 (L) 27.0 - 33.0 pg   MCHC 30.6 (L) 32.0 - 36.0 g/dL   RDW 15.3 (H) 11.0 - 15.0 %   Platelets 180 140 - 400 Thousand/uL   MPV 11.5 7.5 - 12.5 fL   Neutro Abs 1,232 (L) 1,500 - 7,800 cells/uL   Lymphs Abs 1,915 850 - 3,900 cells/uL   Absolute Monocytes 277 200 - 950 cells/uL   Eosinophils Absolute 49 15 - 500 cells/uL   Basophils Absolute 28 0 - 200 cells/uL   Neutrophils Relative % 35.2 %   Total Lymphocyte 54.7 %   Monocytes Relative 7.9 %   Eosinophils Relative 1.4 %   Basophils Relative 0.8 %   Smear Review    COMPLETE METABOLIC PANEL WITH GFR  Result Value Ref Range   Glucose, Bld 84 65 - 99 mg/dL   BUN 11 7 - 25 mg/dL   Creat 0.94 0.70 - 1.35 mg/dL   eGFR 88 > OR = 60 mL/min/1.45m   BUN/Creatinine Ratio SEE NOTE: 6 - 22 (calc)   Sodium 140 135 - 146 mmol/L   Potassium 4.2 3.5 - 5.3 mmol/L   Chloride 104 98 - 110 mmol/L   CO2 28 20 - 32 mmol/L   Calcium 9.6 8.6 - 10.3 mg/dL   Total Protein 7.5 6.1 - 8.1 g/dL   Albumin  4.4 3.6 - 5.1 g/dL   Globulin 3.1 1.9 - 3.7 g/dL (calc)   AG Ratio 1.4 1.0 - 2.5 (calc)   Total Bilirubin 0.6 0.2 - 1.2 mg/dL   Alkaline phosphatase (APISO) 64 35 - 144 U/L   AST 25 10 - 35 U/L   ALT 25 9 - 46 U/L      Assessment & Plan:   Problem List Items Addressed This Visit     BPH with obstruction/lower urinary tract symptoms    Followed by [facility] Prior prostate biopsy, last with abnormal but no cancer identified Elevated PSA previously 7 range now due for repeat Apt was re-scheduled w/ Urology      Elevated hemoglobin A1c    A1c up to 5.7, stable Recommend lifestyle modification to manage and control Pre DM      Hyperlipidemia    Worsening control Lipids LDL off Statin therapy back to >131 LDL Off Rosuvastatin 68m due to ran out of med, asymptomatic no side effect  The 10-year ASCVD risk score (Arnett DK, et al., 2019) is: 10.6%  Plan: 1. RESTART Rosuvastatin at lower dose 571m nightly, he is hesitant to restart 1035mose, had mild elevated LFT that has now resolved 2. Continue ASA 35m10mr primary ASCVD risk reduction 3. Encourage improved lifestyle - low carb/cholesterol, reduce portion size, continue improving regular exercise  Repeat labs 6 months      Relevant Medications   rosuvastatin (CRESTOR) 5 MG tablet   Obesity (BMI 30.0-34.9)   Other Visit Diagnoses     Annual physical exam    -  Primary   Elevated BP without diagnosis of hypertension           Updated Health Maintenance information Reviewed recent lab results with patient Encouraged improvement to lifestyle with diet and exercise Goal of weight loss  Future Shingles vaccine  [place] may need Doxycycline for malaria prevention   Meds ordered this encounter  Medications   rosuvastatin (CRESTOR) 5 MG tablet    Sig: Take 1 tablet (5 mg total) by mouth at bedtime.    Dispense:  90 tablet    Refill:  3      Follow up plan: Return in about 6 months (around 03/28/2023) for 6 month follow-up fasting lab only then 1 week later Follow-up Cholesterol, Shingles Vaccine.  6 month Lipid CMET A1c  Walter Hebert 09/26/2022, 8:34 AM

## 2022-09-26 NOTE — Assessment & Plan Note (Signed)
Worsening control Lipids LDL off Statin therapy back to >131 LDL Off Rosuvastatin '10mg'$  due to ran out of med, asymptomatic no side effect  The 10-year ASCVD risk score (Arnett DK, et al., 2019) is: 10.6%  Plan: 1. RESTART Rosuvastatin at lower dose '5mg'$  nightly, he is hesitant to restart '10mg'$  dose, had mild elevated LFT that has now resolved 2. Continue ASA '81mg'$  for primary ASCVD risk reduction 3. Encourage improved lifestyle - low carb/cholesterol, reduce portion size, continue improving regular exercise  Repeat labs 6 months

## 2022-09-26 NOTE — Assessment & Plan Note (Signed)
Followed by [facility] Prior prostate biopsy, last with abnormal but no cancer identified Elevated PSA previously 7 range now due for repeat Apt was re-scheduled w/ Urology

## 2022-09-26 NOTE — Patient Instructions (Addendum)
Thank you for coming to the office today.  Shingles vaccine is available, you have had chicken pox, you would benefit from the Shingles vaccine to help prevent future re-activation of chicken pox virus. It causes one isolated area nerve pain and rash.  - 2 shots, 2-6 months apart, very effective at preventing complications from shingles if you want to get it.  Liver enzyme back to normal. Cholesterol did go back up to 130s I would restart the Cholesterol pill at HALF the dose, down to Rosuvastatin '5mg'$  dose nightly.  Recent Labs    10/12/21 0821 05/23/22 1011 09/22/22 1004  HGBA1C 5.6 5.7* 5.7*     Chemistry      Component Value Date/Time   NA 140 09/22/2022 1004   K 4.2 09/22/2022 1004   CL 104 09/22/2022 1004   CO2 28 09/22/2022 1004   BUN 11 09/22/2022 1004   CREATININE 0.94 09/22/2022 1004      Component Value Date/Time   CALCIUM 9.6 09/22/2022 1004   ALKPHOS 67 09/19/2016 1603   AST 25 09/22/2022 1004   ALT 25 09/22/2022 1004   BILITOT 0.6 09/22/2022 1004     Lipid Panel     Component Value Date/Time   CHOL 222 (H) 09/22/2022 1004   TRIG 102 09/22/2022 1004   HDL 71 09/22/2022 1004   CHOLHDL 3.1 09/22/2022 1004   LDLCALC 131 (H) 09/22/2022 1004     Future consider COVID19 Booster if/when ready  Nerve compression Left lower leg, we can consider Nerve pills in future if interested, main goal is to avoid nerve compression and avoid laying on that leg.  DUE for FASTING BLOOD WORK (no food or drink after midnight before the lab appointment, only water or coffee without cream/sugar on the morning of)  SCHEDULE "Lab Only" visit in the morning at the clinic for lab draw in 6 MONTHS   - Make sure Lab Only appointment is at about 1 week before your next appointment, so that results will be available  For Lab Results, once available within 2-3 days of blood draw, you can can log in to MyChart online to view your results and a brief explanation. Also, we can discuss  results at next follow-up visit.   Please schedule a Follow-up Appointment to: Return in about 6 months (around 03/28/2023) for 6 month follow-up fasting lab only then 1 week later Follow-up Cholesterol, Shingles Vaccine.  If you have any other questions or concerns, please feel free to call the office or send a message through [place]. You may also schedule an earlier appointment if necessary.  Additionally, you may be receiving a survey about your experience at our office within a few days to 1 week by e-mail or mail. We value your feedback.  Nobie Putnam, DO [place]

## 2022-09-27 ENCOUNTER — Encounter: Payer: Self-pay | Admitting: Urology

## 2022-09-27 ENCOUNTER — Ambulatory Visit: Payer: Medicare Other | Admitting: Urology

## 2022-09-27 VITALS — BP 156/74 | HR 53 | Ht 70.0 in | Wt 212.0 lb

## 2022-09-27 DIAGNOSIS — R972 Elevated prostate specific antigen [PSA]: Secondary | ICD-10-CM | POA: Diagnosis not present

## 2022-09-27 NOTE — Progress Notes (Signed)
   09/27/2022 9:46 AM   Walter Hebert [birth year] [id number]  Referring provider: Olin Hauser, DO [address] [place],  [place] [address]  Chief Complaint  Patient presents with   Elevated PSA    Urologic history: 1.  Elevated PSA Cognitive biopsy 02/2022 PSA 7.5 Prostate volume 75 g MRI with PI-RADS 3 and PI-RADS 4 lesions Fusion biopsy could not be performed secondary to insurance Pathology 2 cores high-grade PIN   HPI: 71 y.o. male presents for 6 month follow-up.  Doing well since last visit No bothersome LUTS Denies dysuria, gross hematuria Denies flank, abdominal or pelvic pain PSA has not been drawn    PMH: 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 on biopsy may 2015   Night sweats    Synovial cyst     Surgical History: Past Surgical History:  Procedure Laterality Date   COLONOSCOPY N/A 05/18/2015   Procedure: COLONOSCOPY;  Surgeon: Lucilla Lame, MD;  Location: [facility] ENDOSCOPY;  Service: Endoscopy;  Laterality: N/A;    Home Medications:  Allergies as of 09/27/2022       Reactions   Chloroquine Itching   Hydroxychloroquine Sulfate Hives        Medication List        Accurate as of September 27, 2022  9:46 AM. If you have any questions, ask your nurse or doctor.          multivitamin capsule Take 1 capsule by mouth daily. Reported on 12/24/2015   rosuvastatin 5 MG tablet Commonly known as: Crestor Take 1 tablet (5 mg total) by mouth at bedtime.   sildenafil 100 MG tablet Commonly known as: VIAGRA Take 1 tablet (100 mg total) by mouth daily as needed for erectile dysfunction. Take two hours prior to intercourse on an empty stomach   VITAMIN C PO Take 1 capsule daily by mouth.   VITAMIN E PO Take by mouth.        Allergies:  Allergies  Allergen Reactions   Chloroquine Itching   Hydroxychloroquine Sulfate Hives     Family History: Family History  Problem Relation Age of Onset   Hyperlipidemia Other    Prostate cancer Brother        DECEASED   Healthy Father    Healthy Mother    Kidney disease Neg Hx     Social History:  reports that he has never smoked. He has never used smokeless tobacco. He reports that he does not drink alcohol and does not use drugs.   Physical Exam: BP (!) 156/74   Pulse (!) 53   Ht '5\' 10"'$  (1.778 m)   Wt 212 lb (96.2 kg)   BMI 30.42 kg/m   Constitutional:  Alert and oriented, No acute distress. HEENT: [place] AT Respiratory: Normal respiratory effort, no increased work of breathing. Psychiatric: Normal mood and affect.   Assessment & Plan:    1.  Elevated PSA PSA drawn today If stable 73-monthfollow-up PSA/DRE [facility]   [name]  [place] [facility] [place] [place] [phone]

## 2022-09-28 LAB — PSA: Prostate Specific Ag, Serum: 7.6 ng/mL — ABNORMAL HIGH (ref 0.0–4.0)

## 2022-09-29 ENCOUNTER — Encounter: Payer: Self-pay | Admitting: Urology

## 2023-02-02 ENCOUNTER — Ambulatory Visit (INDEPENDENT_AMBULATORY_CARE_PROVIDER_SITE_OTHER): Payer: Medicare Other

## 2023-02-02 VITALS — Ht 70.0 in | Wt 212.0 lb

## 2023-02-02 DIAGNOSIS — Z0000 Encounter for general adult medical examination without abnormal findings: Secondary | ICD-10-CM

## 2023-02-02 NOTE — Progress Notes (Signed)
I connected with  Eunice A Puzio on 02/02/23 by a audio enabled telemedicine application and verified that I am speaking with the correct person using two identifiers.  Patient Location: Home  Provider Location: Office/Clinic  I discussed the limitations of evaluation and management by telemedicine. The patient expressed understanding and agreed to proceed.  Subjective:   Berman A Dioguardi is a 72 y.o. male who presents for Medicare Annual/Subsequent preventive examination.  Review of Systems     Cardiac Risk Factors include: advanced age (>38mn, >>47women);male gender     Objective:    There were no vitals filed for this visit. There is no height or weight on file to calculate BMI.     02/02/2023    3:01 PM 09/14/2021    9:54 AM 09/19/2016    2:52 PM  Advanced Directives  Does Patient Have a Medical Advance Directive? No No No  Would patient like information on creating a medical advance directive? No - Patient declined      Current Medications (verified) Outpatient Encounter Medications as of 02/02/2023  Medication Sig   Ascorbic Acid (VITAMIN C PO) Take 1 capsule daily by mouth.    Multiple Vitamin (MULTIVITAMIN) capsule Take 1 capsule by mouth daily. Reported on 12/24/2015   rosuvastatin (CRESTOR) 5 MG tablet Take 1 tablet (5 mg total) by mouth at bedtime.   sildenafil (VIAGRA) 100 MG tablet Take 1 tablet (100 mg total) by mouth daily as needed for erectile dysfunction. Take two hours prior to intercourse on an empty stomach   VITAMIN E PO Take by mouth.   No facility-administered encounter medications on file as of 02/02/2023.    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   Night sweats    Synovial cyst    Past Surgical History:  Procedure Laterality Date   COLONOSCOPY N/A  05/18/2015   Procedure: COLONOSCOPY;  Surgeon: DLucilla Lame MD;  Location: [facility] ENDOSCOPY;  Service: Endoscopy;  Laterality: N/A;   Family History  Problem Relation Age of Onset   Hyperlipidemia Other    Prostate cancer Brother        DECEASED   Healthy Father    Healthy Mother    Kidney disease Neg Hx    Social History   Socioeconomic History   Marital status: Married    Spouse name: Not on file   Number of children: Not on file   Years of education: Not on file   Highest education level: Not on file  Occupational History   Not on file  Tobacco Use   Smoking status: Never   Smokeless tobacco: Never  Vaping Use   Vaping Use: Never used  Substance and Sexual Activity   Alcohol use: No    Comment: Quit- 1990   Drug use: No   Sexual activity: Not on file  Other Topics Concern   Not on file  Social History Narrative   Not on file   Social Determinants of Health   Financial Resource Strain: Low Risk  (02/02/2023)   Overall Financial Resource Strain (CARDIA)    Difficulty of Paying Living Expenses: Not hard at all  Food Insecurity: No Food Insecurity (02/02/2023)   Hunger Vital Sign    Worried About Running Out of Food in the Last Year: Never true  Ran Out of Food in the Last Year: Never true  Transportation Needs: No Transportation Needs (02/02/2023)   PRAPARE - Hydrologist (Medical): No    Lack of Transportation (Non-Medical): No  Physical Activity: Sufficiently Active (02/02/2023)   Exercise Vital Sign    Days of Exercise per Week: 5 days    Minutes of Exercise per Session: 60 min  Stress: No Stress Concern Present (02/02/2023)   [place]    Feeling of Stress : Not at all  Social Connections: Moderately Integrated (02/02/2023)   Social Connection and Isolation Panel [NHANES]    Frequency of Communication with Friends and Family: Twice a week    Frequency of Social  Gatherings with Friends and Family: Twice a week    Attends Religious Services: More than 4 times per year    Active Member of [employer] or Organizations: No    Attends Music therapist: Never    Marital Status: Married    Tobacco Counseling Counseling given: Not Answered   Clinical Intake:  Pre-visit preparation completed: Yes  Pain : No/denies pain     Nutritional Risks: None Diabetes: No  How often do you need to have someone help you when you read instructions, pamphlets, or other written materials from your doctor or pharmacy?: 1 - Never  Diabetic?no  Interpreter Needed?: No  Information entered by :: Kirke Shaggy, LPN   Activities of Daily Living    02/02/2023    3:02 PM  In your present state of health, do you have any difficulty performing the following activities:  Hearing? 0  Vision? 0  Difficulty concentrating or making decisions? 0  Walking or climbing stairs? 0  Dressing or bathing? 0  Doing errands, shopping? 0  Preparing Food and eating ? N  Using the Toilet? N  In the past six months, have you accidently leaked urine? N  Do you have problems with loss of bowel control? N  Managing your Medications? N  Managing your Finances? N  Housekeeping or managing your Housekeeping? N    Patient Care Team: Olin Hauser, DO as PCP - General (Family Medicine)  Indicate any recent Medical Services you may have received from other than [facility] providers in the past year (date may be approximate).     Assessment:   This is a routine wellness examination for Demonte.  Hearing/Vision screen Hearing Screening - Comments:: No aids Vision Screening - Comments:: No glasses  Dietary issues and exercise activities discussed: Current Exercise Habits: Home exercise routine, Type of exercise: stretching;walking (work), Time (Minutes): 60, Frequency (Times/Week): 5, Weekly Exercise (Minutes/Week): 300, Intensity: Moderate   Goals Addressed              This Visit's Progress    DIET - EAT MORE FRUITS AND VEGETABLES         Depression Screen    02/02/2023    2:59 PM 09/26/2022    9:54 AM 05/23/2022    9:55 AM 04/13/2021    8:01 AM 10/13/2020    8:46 AM 10/09/2019    9:29 AM 03/27/2019    7:58 AM  PHQ 2/9 Scores  PHQ - 2 Score 0 0 0 0 0 0 0  PHQ- 9 Score 0 0    0     Fall Risk    02/02/2023    3:01 PM 09/26/2022    9:54 AM 04/13/2021    8:01 AM 10/13/2020  8:34 AM 10/09/2019    9:29 AM  Fall Risk   Falls in the past year? 0 0 0 0 0  Number falls in past yr: 0 0 0 0   Injury with Fall? 0 0 0 0   Risk for fall due to : No Fall Risks No Fall Risks     Follow up Falls prevention discussed;Falls evaluation completed Falls evaluation completed Falls evaluation completed Falls evaluation completed     FALL RISK PREVENTION PERTAINING TO THE HOME:  Any stairs in or around the home? No  If so, are there any without handrails? No  Home free of loose throw rugs in walkways, pet beds, electrical cords, etc? Yes  Adequate lighting in your home to reduce risk of falls? Yes   ASSISTIVE DEVICES UTILIZED TO PREVENT FALLS:  Life alert? No  Use of a cane, walker or w/c? No  Grab bars in the bathroom? No  Shower chair or bench in shower? No  Elevated toilet seat or a handicapped toilet? No    Cognitive Function:        02/02/2023    3:05 PM  6CIT Screen  What Year? 0 points  What month? 0 points  What time? 0 points  Count back from 20 0 points  Months in reverse 0 points  Repeat phrase 0 points  Total Score 0 points    Immunizations Immunization History  Administered Date(s) Administered   Fluad Quad(high Dose 65+) 10/09/2019, 10/13/2020, 10/19/2021, 09/01/2022   Influenza, High Dose Seasonal PF 09/25/2018   Influenza,inj,Quad PF,6+ Mos 09/19/2016, 09/21/2017   Moderna Sars-Covid-2 Vaccination 06/21/2020, 07/19/2020   Pfizer Covid-19 Vaccine Bivalent Booster 68yr & up 10/24/2021   Pneumococcal Conjugate-13  09/25/2018   Pneumococcal Polysaccharide-23 10/09/2019   Tdap 09/13/2021    TDAP status: Up to date  Flu Vaccine status: Up to date  Pneumococcal vaccine status: Up to date  Covid-19 vaccine status: Completed vaccines  Qualifies for Shingles Vaccine? Yes   Zostavax completed No   Shingrix Completed?: No.    Education has been provided regarding the importance of this vaccine. Patient has been advised to call insurance company to determine out of pocket expense if they have not yet received this vaccine. Advised may also receive vaccine at local pharmacy or Health Dept. Verbalized acceptance and understanding.  Screening Tests Health Maintenance  Topic Date Due   Zoster Vaccines- Shingrix (1 of 2) Never done   COVID-19 Vaccine (4 - 2023-24 season) 08/18/2022   Medicare Annual Wellness (AWV)  02/03/2024   COLONOSCOPY (Pts 45-450yrInsurance coverage will need to be confirmed)  05/17/2025   DTaP/Tdap/Td (2 - Td or Tdap) 09/14/2031   Pneumonia Vaccine 6517Years old  Completed   INFLUENZA VACCINE  Completed   Hepatitis C Screening  Completed   HPV VACCINES  Aged Out    Health Maintenance  Health Maintenance Due  Topic Date Due   Zoster Vaccines- Shingrix (1 of 2) Never done   COVID-19 Vaccine (4 - 2023-24 season) 08/18/2022    Colorectal cancer screening: Type of screening: Colonoscopy. Completed 05/18/15. Repeat every 10 years  Lung Cancer Screening: (Low Dose CT Chest recommended if Age 70-80ears, 30 pack-year currently smoking OR have quit w/in 15years.) does not qualify.   Additional Screening:  Hepatitis C Screening: does qualify; Completed 09/18/17  Vision Screening: Recommended annual ophthalmology exams for early detection of glaucoma and other disorders of the eye. Is the patient up to date with their annual eye exam?  No  Who is the provider or what is the name of the office in which the patient attends annual eye exams? No one If pt is not established with a  provider, would they like to be referred to a provider to establish care? No .   Dental Screening: Recommended annual dental exams for proper oral hygiene  Community Resource Referral / Chronic Care Management: CRR required this visit?  No   CCM required this visit?  No      Plan:     I have personally reviewed and noted the following in the patient's chart:   Medical and social history Use of alcohol, tobacco or illicit drugs  Current medications and supplements including opioid prescriptions. Patient is not currently taking opioid prescriptions. Functional ability and status Nutritional status Physical activity Advanced directives List of other physicians Hospitalizations, surgeries, and ER visits in previous 12 months Vitals Screenings to include cognitive, depression, and falls Referrals and appointments  In addition, I have reviewed and discussed with patient certain preventive protocols, quality metrics, and best practice recommendations. A written personalized care plan for preventive services as well as general preventive health recommendations were provided to patient.     Dionisio David, LPN   [id number]   Nurse Notes: none

## 2023-02-02 NOTE — Patient Instructions (Signed)
Walter Hebert , Thank you for taking time to come for your Medicare Wellness Visit. I appreciate your ongoing commitment to your health goals. Please review the following plan we discussed and let me know if I can assist you in the future.   These are the goals we discussed:  Goals      DIET - EAT MORE FRUITS AND VEGETABLES        This is a list of the screening recommended for you and due dates:  Health Maintenance  Topic Date Due   Zoster (Shingles) Vaccine (1 of 2) Never done   COVID-19 Vaccine (4 - 2023-24 season) 08/18/2022   Medicare Annual Wellness Visit  02/03/2024   Colon Cancer Screening  05/17/2025   DTaP/Tdap/Td vaccine (2 - Td or Tdap) 09/14/2031   Pneumonia Vaccine  Completed   Flu Shot  Completed   Hepatitis C Screening: USPSTF Recommendation to screen - Ages 18-79 yo.  Completed   HPV Vaccine  Aged Out    Advanced directives: no  Conditions/risks identified: none  Next appointment: Follow up in one year for your annual wellness visit. 02/08/24 @ 11:15 am by phone  Preventive Care 65 Years and Older, Male  Preventive care refers to lifestyle choices and visits with your health care provider that can promote health and wellness. What does preventive care include? A yearly physical exam. This is also called an annual well check. Dental exams once or twice a year. Routine eye exams. Ask your health care provider how often you should have your eyes checked. Personal lifestyle choices, including: Daily care of your teeth and gums. Regular physical activity. Eating a healthy diet. Avoiding tobacco and drug use. Limiting alcohol use. Practicing safe sex. Taking low doses of aspirin every day. Taking vitamin and mineral supplements as recommended by your health care provider. What happens during an annual well check? The services and screenings done by your health care provider during your annual well check will depend on your age, overall health, lifestyle risk  factors, and family history of disease. Counseling  Your health care provider may ask you questions about your: Alcohol use. Tobacco use. Drug use. Emotional well-being. Home and relationship well-being. Sexual activity. Eating habits. History of falls. Memory and ability to understand (cognition). Work and work Statistician. Screening  You may have the following tests or measurements: Height, weight, and BMI. Blood pressure. Lipid and cholesterol levels. These may be checked every 5 years, or more frequently if you are over 29 years old. Skin check. Lung cancer screening. You may have this screening every year starting at age 64 if you have a 30-pack-year history of smoking and currently smoke or have quit within the past 15 years. Fecal occult blood test (FOBT) of the stool. You may have this test every year starting at age 61. Flexible sigmoidoscopy or colonoscopy. You may have a sigmoidoscopy every 5 years or a colonoscopy every 10 years starting at age 61. Prostate cancer screening. Recommendations will vary depending on your family history and other risks. Hepatitis C blood test. Hepatitis B blood test. Sexually transmitted disease (STD) testing. Diabetes screening. This is done by checking your blood sugar (glucose) after you have not eaten for a while (fasting). You may have this done every 1-3 years. Abdominal aortic aneurysm (AAA) screening. You may need this if you are a current or former smoker. Osteoporosis. You may be screened starting at age 67 if you are at high risk. Talk with your health care provider  about your test results, treatment options, and if necessary, the need for more tests. Vaccines  Your health care provider may recommend certain vaccines, such as: Influenza vaccine. This is recommended every year. Tetanus, diphtheria, and acellular pertussis (Tdap, Td) vaccine. You may need a Td booster every 10 years. Zoster vaccine. You may need this after age  61. Pneumococcal 13-valent conjugate (PCV13) vaccine. One dose is recommended after age 18. Pneumococcal polysaccharide (PPSV23) vaccine. One dose is recommended after age 22. Talk to your health care provider about which screenings and vaccines you need and how often you need them. This information is not intended to replace advice given to you by your health care provider. Make sure you discuss any questions you have with your health care provider. Document Released: 12/31/2015 Document Revised: 08/23/2016 Document Reviewed: 10/05/2015 Elsevier Interactive Patient Education  2017 [place] Prevention in the Home Falls can cause injuries. They can happen to people of all ages. There are many things you can do to make your home safe and to help prevent falls. What can I do on the outside of my home? Regularly fix the edges of walkways and driveways and fix any cracks. Remove anything that might make you trip as you walk through a door, such as a raised step or threshold. Trim any bushes or trees on the path to your home. Use bright outdoor lighting. Clear any walking paths of anything that might make someone trip, such as rocks or tools. Regularly check to see if handrails are loose or broken. Make sure that both sides of any steps have handrails. Any raised decks and porches should have guardrails on the edges. Have any leaves, snow, or ice cleared regularly. Use sand or salt on walking paths during winter. Clean up any spills in your garage right away. This includes oil or grease spills. What can I do in the bathroom? Use night lights. Install grab bars by the toilet and in the tub and shower. Do not use towel bars as grab bars. Use non-skid mats or decals in the tub or shower. If you need to sit down in the shower, use a plastic, non-slip stool. Keep the floor dry. Clean up any water that spills on the floor as soon as it happens. Remove soap buildup in the tub or shower  regularly. Attach bath mats securely with double-sided non-slip rug tape. Do not have throw rugs and other things on the floor that can make you trip. What can I do in the bedroom? Use night lights. Make sure that you have a light by your bed that is easy to reach. Do not use any sheets or blankets that are too big for your bed. They should not hang down onto the floor. Have a firm chair that has side arms. You can use this for support while you get dressed. Do not have throw rugs and other things on the floor that can make you trip. What can I do in the kitchen? Clean up any spills right away. Avoid walking on wet floors. Keep items that you use a lot in easy-to-reach places. If you need to reach something above you, use a strong step stool that has a grab bar. Keep electrical cords out of the way. Do not use floor polish or wax that makes floors slippery. If you must use wax, use non-skid floor wax. Do not have throw rugs and other things on the floor that can make you trip. What can I do  with my stairs? Do not leave any items on the stairs. Make sure that there are handrails on both sides of the stairs and use them. Fix handrails that are broken or loose. Make sure that handrails are as long as the stairways. Check any carpeting to make sure that it is firmly attached to the stairs. Fix any carpet that is loose or worn. Avoid having throw rugs at the top or bottom of the stairs. If you do have throw rugs, attach them to the floor with carpet tape. Make sure that you have a light switch at the top of the stairs and the bottom of the stairs. If you do not have them, ask someone to add them for you. What else can I do to help prevent falls? Wear shoes that: Do not have high heels. Have rubber bottoms. Are comfortable and fit you well. Are closed at the toe. Do not wear sandals. If you use a stepladder: Make sure that it is fully opened. Do not climb a closed stepladder. Make sure that  both sides of the stepladder are locked into place. Ask someone to hold it for you, if possible. Clearly mark and make sure that you can see: Any grab bars or handrails. First and last steps. Where the edge of each step is. Use tools that help you move around (mobility aids) if they are needed. These include: Canes. Walkers. Scooters. Crutches. Turn on the lights when you go into a dark area. Replace any light bulbs as soon as they burn out. Set up your furniture so you have a clear path. Avoid moving your furniture around. If any of your floors are uneven, fix them. If there are any pets around you, be aware of where they are. Review your medicines with your doctor. Some medicines can make you feel dizzy. This can increase your chance of falling. Ask your doctor what other things that you can do to help prevent falls. This information is not intended to replace advice given to you by your health care provider. Make sure you discuss any questions you have with your health care provider. Document Released: 09/30/2009 Document Revised: 05/11/2016 Document Reviewed: 01/08/2015 Elsevier Interactive Patient Education  2017 [employer].

## 2023-03-05 DIAGNOSIS — H2513 Age-related nuclear cataract, bilateral: Secondary | ICD-10-CM | POA: Diagnosis not present

## 2023-03-05 DIAGNOSIS — H52223 Regular astigmatism, bilateral: Secondary | ICD-10-CM | POA: Diagnosis not present

## 2023-03-05 DIAGNOSIS — H5203 Hypermetropia, bilateral: Secondary | ICD-10-CM | POA: Diagnosis not present

## 2023-03-05 DIAGNOSIS — H43812 Vitreous degeneration, left eye: Secondary | ICD-10-CM | POA: Diagnosis not present

## 2023-03-23 ENCOUNTER — Other Ambulatory Visit: Payer: Self-pay

## 2023-03-23 ENCOUNTER — Other Ambulatory Visit: Payer: Medicare Other

## 2023-03-23 DIAGNOSIS — R7309 Other abnormal glucose: Secondary | ICD-10-CM | POA: Diagnosis not present

## 2023-03-23 DIAGNOSIS — E7800 Pure hypercholesterolemia, unspecified: Secondary | ICD-10-CM

## 2023-03-24 LAB — COMPLETE METABOLIC PANEL WITH GFR
AG Ratio: 1.3 (calc) (ref 1.0–2.5)
ALT: 22 U/L (ref 9–46)
AST: 22 U/L (ref 10–35)
Albumin: 4.1 g/dL (ref 3.6–5.1)
Alkaline phosphatase (APISO): 74 U/L (ref 35–144)
BUN: 12 mg/dL (ref 7–25)
CO2: 29 mmol/L (ref 20–32)
Calcium: 9.3 mg/dL (ref 8.6–10.3)
Chloride: 104 mmol/L (ref 98–110)
Creat: 0.88 mg/dL (ref 0.70–1.35)
Globulin: 3.1 g/dL (calc) (ref 1.9–3.7)
Glucose, Bld: 91 mg/dL (ref 65–99)
Potassium: 4.3 mmol/L (ref 3.5–5.3)
Sodium: 140 mmol/L (ref 135–146)
Total Bilirubin: 0.4 mg/dL (ref 0.2–1.2)
Total Protein: 7.2 g/dL (ref 6.1–8.1)
eGFR: 93 mL/min/{1.73_m2} (ref >=60)

## 2023-03-24 LAB — LIPID PANEL
Cholesterol: 182 mg/dL (ref <200)
HDL: 71 mg/dL (ref >=40)
LDL Cholesterol (Calc): 95 mg/dL (calc)
Non-HDL Cholesterol (Calc): 111 mg/dL (calc) (ref <130)
Total CHOL/HDL Ratio: 2.6 (calc) (ref <5.0)
Triglycerides: 75 mg/dL (ref <150)

## 2023-03-24 LAB — HEMOGLOBIN A1C
Hgb A1c MFr Bld: 6 % of total Hgb — ABNORMAL HIGH (ref <5.7)
Mean Plasma Glucose: 126 mg/dL
eAG (mmol/L): 7 mmol/L

## 2023-03-29 ENCOUNTER — Other Ambulatory Visit: Payer: Medicare Other

## 2023-03-29 DIAGNOSIS — R972 Elevated prostate specific antigen [PSA]: Secondary | ICD-10-CM

## 2023-03-30 ENCOUNTER — Ambulatory Visit: Payer: Medicare Other | Admitting: Family Medicine

## 2023-03-30 LAB — PSA: Prostate Specific Ag, Serum: 8.3 ng/mL — ABNORMAL HIGH (ref 0.0–4.0)

## 2023-04-02 ENCOUNTER — Other Ambulatory Visit: Payer: Self-pay | Admitting: Family Medicine

## 2023-04-02 ENCOUNTER — Encounter: Payer: Self-pay | Admitting: Family Medicine

## 2023-04-02 ENCOUNTER — Ambulatory Visit (INDEPENDENT_AMBULATORY_CARE_PROVIDER_SITE_OTHER): Payer: Medicare Other | Admitting: Family Medicine

## 2023-04-02 VITALS — BP 136/73 | HR 88 | Ht 70.0 in | Wt 211.0 lb

## 2023-04-02 DIAGNOSIS — N401 Enlarged prostate with lower urinary tract symptoms: Secondary | ICD-10-CM

## 2023-04-02 DIAGNOSIS — E7800 Pure hypercholesterolemia, unspecified: Secondary | ICD-10-CM

## 2023-04-02 DIAGNOSIS — R7309 Other abnormal glucose: Secondary | ICD-10-CM

## 2023-04-02 DIAGNOSIS — Z0000 Encounter for general adult medical examination without abnormal findings: Secondary | ICD-10-CM

## 2023-04-02 DIAGNOSIS — N138 Other obstructive and reflux uropathy: Secondary | ICD-10-CM | POA: Diagnosis not present

## 2023-04-02 DIAGNOSIS — M62830 Muscle spasm of back: Secondary | ICD-10-CM | POA: Diagnosis not present

## 2023-04-02 DIAGNOSIS — R972 Elevated prostate specific antigen [PSA]: Secondary | ICD-10-CM

## 2023-04-02 MED ORDER — BACLOFEN 10 MG PO TABS: 5.0000 mg | ORAL_TABLET | Freq: Three times a day (TID) | ORAL | 1 refills | Status: DC | PRN

## 2023-04-02 NOTE — Assessment & Plan Note (Signed)
Improved LDL result to 90s now on low dose Statin  The 10-year ASCVD risk score (Arnett DK, et al., 2019) is: 11.1%  Plan: 1. Continue Rosuvastatin at lower dose 5mg  nightly. LFT normalized 2. Continue ASA 81mg  for primary ASCVD risk reduction 3. Encourage improved lifestyle - low carb/cholesterol, reduce portion size, continue improving regular exercise  Repeat labs 6 months

## 2023-04-02 NOTE — Progress Notes (Signed)
Subjective:    Patient ID: Walter Hebert, male    DOB: 11/29/1951, 72 y.o.   MRN: [id number]  Walter Hebert is a 72 y.o. male presenting on 04/02/2023 for Medical Management of Chronic Issues (Declines shingles vaccine)   HPI  Elevated A1c / Obesity BMI >30 / Elevated BP without HTN  Last lab A1c 6.0, slightly higher than previous 5.7 Weight stable to down 205 lbs naked at home on his scale but with clothes wt up to 211-212 lbs Today reports doing well on improved lifestyle still Lifestyle - Diet: Continues healthy diet, still admits eating snack or meal in PM avoid late night large meal before bed. Reduced starches / carbs and portions. Drinks water mostly, also warm water with lemon/lime in AM - Exercise: Goal to resume exercise Denies hypoglycemia   LFT Elevation - RESOLVED No further LFT elevation. 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 Last PSA trend 7.6 > 8.3 now Prostate biopsy has shown high grade intraepithelial neoplasia but no confirmed cancer   HYPERLIPIDEMIA Last lipid 03/2023 with LDL down to 90s, prior 130s, now back on low dose Statin Continues on Rosuvastatin 5mg  nightly tolerating without myalgia    Health Maintenance: Declines Shingrix     04/02/2023    8:45 AM 02/02/2023    2:59 PM 09/26/2022    9:54 AM  Depression screen PHQ 2/9  Decreased Interest 0 0 0  Down, Depressed, Hopeless 0 0 0  PHQ - 2 Score 0 0 0  Altered sleeping 0 0 0  Tired, decreased energy 0 0 0  Change in appetite 0 0 0  Feeling bad or failure about yourself  0 0 0  Trouble concentrating 0 0 0  Moving slowly or fidgety/restless 0 0 0  Suicidal thoughts 0 0 0  PHQ-9 Score 0 0 0  Difficult doing work/chores  Not difficult at all Not difficult at all    Social History   Tobacco Use   Smoking status: Never   Smokeless tobacco: Never  Vaping Use   Vaping  Use: Never used  Substance Use Topics   Alcohol use: No    Comment: Quit- 1990   Drug use: No    Review of Systems Per HPI unless specifically indicated above     Objective:    BP 136/73   Pulse 88   Ht 5\' 10"  (1.778 m)   Wt 211 lb (95.7 kg)   BMI 30.28 kg/m   Wt Readings from Last 3 Encounters:  04/02/23 211 lb (95.7 kg)  02/02/23 212 lb (96.2 kg)  09/27/22 212 lb (96.2 kg)    Physical Exam Vitals and nursing note reviewed.  Constitutional:      General: He is not in acute distress.    Appearance: Normal appearance. He is well-developed. He is not diaphoretic.     Comments: Well-appearing, comfortable, cooperative  HENT:     Head: Normocephalic and atraumatic.  Eyes:     General:        Right eye: No discharge.        Left eye: No discharge.     Conjunctiva/sclera: Conjunctivae normal.  Cardiovascular:     Rate and Rhythm: Normal rate.  Pulmonary:     Effort: Pulmonary effort is normal.  Musculoskeletal:     Comments: R low back paraspinal lumbar hypertonicity  Skin:  General: Skin is warm and dry.     Findings: No erythema or rash.  Neurological:     Mental Status: He is alert and oriented to person, place, and time.  Psychiatric:        Mood and Affect: Mood normal.        Behavior: Behavior normal.        Thought Content: Thought content normal.     Comments: Well groomed, good eye contact, normal speech and thoughts    Results for orders placed or performed in visit on 03/29/23  PSA  Result Value Ref Range   Prostate Specific Ag, Serum 8.3 (H) 0.0 - 4.0 ng/mL      Assessment & Plan:   Problem List Items Addressed This Visit     BPH with obstruction/lower urinary tract symptoms    Followed by [facility] Prior prostate biopsy, last with abnormal but no cancer identified Elevated PSA previously 7 range now up to 8.3  He will schedule w/ Urology  He still has BPH Nocturia symptoms      Elevated hemoglobin A1c - Primary    A1c up to 6.0, still  mild range pre diabetes Recommend lifestyle modification to manage and control Pre DM      Elevated PSA    See above AP      Hyperlipidemia    Improved LDL result to 90s now on low dose Statin  The 10-year ASCVD risk score (Arnett DK, et al., 2019) is: 11.1%  Plan: 1. Continue Rosuvastatin at lower dose  nightly. LFT normalized 2. Continue ASA  for primary ASCVD risk reduction 3. Encourage improved lifestyle - low carb/cholesterol, reduce portion size, continue improving regular exercise  Repeat labs 6 months      Other Visit Diagnoses     Muscle spasm of back       Relevant Medications   baclofen (LIORESAL) 10 MG tablet       R LBP muscle spasm Prior fal 20 years ago, has spasms Order Baclofen muscle relaxant AS NEEDED Consider OTC Voltaren  Forward chart to Urology with PSA 8.3  Meds ordered this encounter  Medications   baclofen (LIORESAL) 10 MG tablet    Sig: Take 0.5-1 tablets (5-10 mg total) by mouth 3 (three) times daily as needed for muscle spasms.    Dispense:  30 each    Refill:  1     Follow up plan: Return in about 6 months (around 10/02/2023) for 6  month fasting lab only then 1 week later Annual Physical.  Future labs ordered for 10/10/23  Saralyn Pilar, DO [facility] 04/02/2023, 8:55 AM

## 2023-04-02 NOTE — Assessment & Plan Note (Signed)
See above A/P 

## 2023-04-02 NOTE — Patient Instructions (Addendum)
Thank you for coming to the office today.  Last result PSA 8.3, this is slightly higher, please discuss with Urologist.  Recent Labs    05/23/22 1011 09/22/22 1004 03/23/23 0801  HGBA1C 5.7* 5.7* 6.0*   Weight is improved  Goal to keep working on lowering blood sugar.  Lipid Panel     Component Value Date/Time   CHOL 182 03/23/2023 0801   TRIG 75 03/23/2023 0801   HDL 71 03/23/2023 0801   CHOLHDL 2.6 03/23/2023 0801   LDLCALC 95 03/23/2023 0801   Cholesterol is improved. Keep on low dose Rosuvastatin.   For the muscle on back can try this topical  START anti inflammatory topical - OTC Voltaren (generic Diclofenac) topical 2-4 times a day as needed for pain swelling of affected joint for 1-2 weeks or longer.  DUE for FASTING BLOOD WORK (no food or drink after midnight before the lab appointment, only water or coffee without cream/sugar on the morning of)  SCHEDULE "Lab Only" visit in the morning at the clinic for lab draw in 6 MONTHS   - Make sure Lab Only appointment is at about 1 week before your next appointment, so that results will be available  For Lab Results, once available within 2-3 days of blood draw, you can can log in to MyChart online to view your results and a brief explanation. Also, we can discuss results at next follow-up visit.     Please schedule a Follow-up Appointment to: Return in about 6 months (around 10/02/2023) for 6  month fasting lab only then 1 week later Annual Physical.  If you have any other questions or concerns, please feel free to call the office or send a message through MyChart. You may also schedule an earlier appointment if necessary.  Additionally, you may be receiving a survey about your experience at our office within a few days to 1 week by e-mail or mail. We value your feedback.  Saralyn Pilar, DO [facility], [place]

## 2023-04-02 NOTE — Assessment & Plan Note (Signed)
Followed by [facility] Prior prostate biopsy, last with abnormal but no cancer identified Elevated PSA previously 7 range now up to 8.3  He will schedule w/ Urology  He still has BPH Nocturia symptoms

## 2023-04-02 NOTE — Assessment & Plan Note (Signed)
A1c up to 6.0, still mild range pre diabetes Recommend lifestyle modification to manage and control Pre DM

## 2023-04-12 ENCOUNTER — Telehealth: Payer: Self-pay | Admitting: *Deleted

## 2023-04-12 NOTE — Telephone Encounter (Signed)
-----   Message from Riki Altes, MD sent at 04/12/2023  1:16 PM EDT ----- PSA was increased to 8.3.  Please schedule follow-up lab visit with PSA late June 2024

## 2023-04-12 NOTE — Telephone Encounter (Signed)
Notified patient as instructed, patient pleased. Discussed follow-up appointments, patient agrees  

## 2023-06-11 ENCOUNTER — Other Ambulatory Visit: Payer: Medicare Other

## 2023-06-11 ENCOUNTER — Other Ambulatory Visit: Payer: Self-pay

## 2023-06-11 DIAGNOSIS — R972 Elevated prostate specific antigen [PSA]: Secondary | ICD-10-CM

## 2023-06-12 LAB — PSA: Prostate Specific Ag, Serum: 7.7 ng/mL — ABNORMAL HIGH (ref 0.0–4.0)

## 2023-06-14 ENCOUNTER — Other Ambulatory Visit: Payer: Self-pay

## 2023-06-14 DIAGNOSIS — R972 Elevated prostate specific antigen [PSA]: Secondary | ICD-10-CM

## 2023-08-24 ENCOUNTER — Other Ambulatory Visit: Payer: Self-pay | Admitting: Family Medicine

## 2023-08-24 DIAGNOSIS — E7800 Pure hypercholesterolemia, unspecified: Secondary | ICD-10-CM

## 2023-10-03 ENCOUNTER — Other Ambulatory Visit: Payer: Medicare Other

## 2023-10-03 DIAGNOSIS — R972 Elevated prostate specific antigen [PSA]: Secondary | ICD-10-CM

## 2023-10-04 LAB — PSA: Prostate Specific Ag, Serum: 10.8 ng/mL — ABNORMAL HIGH (ref 0.0–4.0)

## 2023-10-09 ENCOUNTER — Other Ambulatory Visit: Payer: Self-pay

## 2023-10-09 DIAGNOSIS — N138 Other obstructive and reflux uropathy: Secondary | ICD-10-CM

## 2023-10-09 DIAGNOSIS — Z0000 Encounter for general adult medical examination without abnormal findings: Secondary | ICD-10-CM

## 2023-10-09 DIAGNOSIS — R972 Elevated prostate specific antigen [PSA]: Secondary | ICD-10-CM

## 2023-10-09 DIAGNOSIS — E7800 Pure hypercholesterolemia, unspecified: Secondary | ICD-10-CM

## 2023-10-09 DIAGNOSIS — R7309 Other abnormal glucose: Secondary | ICD-10-CM

## 2023-10-10 ENCOUNTER — Other Ambulatory Visit: Payer: Medicare Other

## 2023-10-10 ENCOUNTER — Ambulatory Visit: Payer: Medicare Other | Admitting: Urology

## 2023-10-10 DIAGNOSIS — E7800 Pure hypercholesterolemia, unspecified: Secondary | ICD-10-CM | POA: Diagnosis not present

## 2023-10-10 DIAGNOSIS — R7309 Other abnormal glucose: Secondary | ICD-10-CM | POA: Diagnosis not present

## 2023-10-10 DIAGNOSIS — N138 Other obstructive and reflux uropathy: Secondary | ICD-10-CM | POA: Diagnosis not present

## 2023-10-11 ENCOUNTER — Encounter: Payer: Self-pay | Admitting: Urology

## 2023-10-11 ENCOUNTER — Ambulatory Visit: Payer: Medicare Other | Admitting: Urology

## 2023-10-11 VITALS — BP 140/84 | HR 86 | Ht 68.0 in | Wt 200.0 lb

## 2023-10-11 DIAGNOSIS — R972 Elevated prostate specific antigen [PSA]: Secondary | ICD-10-CM | POA: Diagnosis not present

## 2023-10-11 LAB — COMPLETE METABOLIC PANEL WITH GFR
AG Ratio: 1.3 (calc) (ref 1.0–2.5)
ALT: 19 U/L (ref 9–46)
AST: 23 U/L (ref 10–35)
Albumin: 4.3 g/dL (ref 3.6–5.1)
Alkaline phosphatase (APISO): 66 U/L (ref 35–144)
BUN: 23 mg/dL (ref 7–25)
CO2: 26 mmol/L (ref 20–32)
Calcium: 9.7 mg/dL (ref 8.6–10.3)
Chloride: 104 mmol/L (ref 98–110)
Creat: 1.15 mg/dL (ref 0.70–1.28)
Globulin: 3.2 g/dL (ref 1.9–3.7)
Glucose, Bld: 90 mg/dL (ref 65–99)
Potassium: 4.4 mmol/L (ref 3.5–5.3)
Sodium: 141 mmol/L (ref 135–146)
Total Bilirubin: 0.6 mg/dL (ref 0.2–1.2)
Total Protein: 7.5 g/dL (ref 6.1–8.1)
eGFR: 68 mL/min/{1.73_m2} (ref >=60)

## 2023-10-11 LAB — CBC WITH DIFFERENTIAL/PLATELET
Absolute Lymphocytes: 2428 {cells}/uL (ref 850–3900)
Absolute Monocytes: 432 {cells}/uL (ref 200–950)
Basophils Absolute: 32 {cells}/uL (ref 0–200)
Basophils Relative: 0.8 %
Eosinophils Absolute: 112 {cells}/uL (ref 15–500)
Eosinophils Relative: 2.8 %
HCT: 47.5 % (ref 38.5–50.0)
Hemoglobin: 14.7 g/dL (ref 13.2–17.1)
MCH: 24 pg — ABNORMAL LOW (ref 27.0–33.0)
MCHC: 30.9 g/dL — ABNORMAL LOW (ref 32.0–36.0)
MCV: 77.6 fL — ABNORMAL LOW (ref 80.0–100.0)
MPV: 10.9 fL (ref 7.5–12.5)
Monocytes Relative: 10.8 %
Neutro Abs: 996 {cells}/uL — ABNORMAL LOW (ref 1500–7800)
Neutrophils Relative %: 24.9 %
Platelets: 166 10*3/uL (ref 140–400)
RBC: 6.12 10*6/uL — ABNORMAL HIGH (ref 4.20–5.80)
RDW: 14.5 % (ref 11.0–15.0)
Total Lymphocyte: 60.7 %
WBC: 4 10*3/uL (ref 3.8–10.8)

## 2023-10-11 LAB — PSA: PSA: 5.67 ng/mL — ABNORMAL HIGH (ref <=4.00)

## 2023-10-11 LAB — LIPID PANEL
Cholesterol: 215 mg/dL — ABNORMAL HIGH (ref <200)
HDL: 70 mg/dL (ref >=40)
LDL Cholesterol (Calc): 129 mg/dL — ABNORMAL HIGH
Non-HDL Cholesterol (Calc): 145 mg/dL — ABNORMAL HIGH (ref <130)
Total CHOL/HDL Ratio: 3.1 (calc) (ref <5.0)
Triglycerides: 67 mg/dL (ref <150)

## 2023-10-11 LAB — HEMOGLOBIN A1C
Hgb A1c MFr Bld: 5.9 %{Hb} — ABNORMAL HIGH (ref <5.7)
Mean Plasma Glucose: 123 mg/dL
eAG (mmol/L): 6.8 mmol/L

## 2023-10-11 LAB — TSH: TSH: 2.72 m[IU]/L (ref 0.40–4.50)

## 2023-10-11 NOTE — Progress Notes (Signed)
I, Maysun Anabel Bene, acting as a scribe for Riki Altes, MD., have documented all relevant documentation on the behalf of Riki Altes, MD, as directed by Riki Altes, MD while in the presence of Riki Altes, MD.  10/11/2023 10:48 AM   Walter Hebert Jul 11, 1951 [id number]  Referring provider: Smitty Cords, DO [address] [place],  [place] [address]  Chief Complaint  Patient presents with   Elevated PSA   Urologic history: 1.  Elevated PSA Cognitive biopsy 02/2022 PSA 7.5 Prostate volume 75 g MRI with PI-RADS 3 and PI-RADS 4 lesions Fusion biopsy could not be performed secondary to insurance Pathology 2 cores high-grade PIN  HPI: Walter Hebert is a 72 y.o. male presents for 1 year follow-up.   PSA levels April and June 2024 were 8.3 and 7.7 respectively.  PSA 09/23/23 had increased to 10.8. PSA was drawn yesterday, ordered by PCP, which was 5.67 though this was different methodology/lab. No bothersome lower urinary tract symptoms.  PSA trend  Prostate Specific Ag, Serum  Latest Ref Rng 0.0 - 4.0 ng/mL  11/11/2018 4.6 (H)   02/12/2019 4.1 (H)   08/22/2019 5.8 (H)   03/17/2020 6.0 (H)   09/20/2020 6.1 (H)   03/28/2021 6.3 (H)   09/30/2021 7.7 (H)   01/30/2022 7.5 (H)   09/27/2022 7.6 (H)   03/29/2023 8.3 (H)   06/11/2023 7.7 (H)   10/03/2023 10.8 (H)     PMH: 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 on biopsy may 2015   Night sweats    Synovial cyst     Surgical History: Past Surgical History:  Procedure Laterality Date   COLONOSCOPY N/A 05/18/2015   Procedure: COLONOSCOPY;  Surgeon: Midge Minium, MD;  Location: [facility] ENDOSCOPY;  Service: Endoscopy;  Laterality: N/A;    Home Medications:  Allergies as of 10/11/2023       Reactions   Chloroquine Itching   Hydroxychloroquine Sulfate Hives        Medication List        Accurate as of  October 11, 2023 10:48 AM. If you have any questions, ask your nurse or doctor.          baclofen 10 MG tablet Commonly known as: LIORESAL Take 0.5-1 tablets (5-10 mg total) by mouth 3 (three) times daily as needed for muscle spasms.   multivitamin capsule Take 1 capsule by mouth daily. Reported on 12/24/2015   rosuvastatin 5 MG tablet Commonly known as: CRESTOR TAKE 1 TABLET BY MOUTH EVERYDAY AT BEDTIME   sildenafil 100 MG tablet Commonly known as: VIAGRA Take 1 tablet (100 mg total) by mouth daily as needed for erectile dysfunction. Take two hours prior to intercourse on an empty stomach   VITAMIN C PO Take 1 capsule daily by mouth.   VITAMIN E PO Take by mouth.        Allergies:  Allergies  Allergen Reactions   Chloroquine Itching   Hydroxychloroquine Sulfate Hives    Family History: Family History  Problem Relation Age of Onset   Hyperlipidemia Other    Prostate cancer Brother        DECEASED   Healthy Father    Healthy Mother    Kidney disease Neg Hx     Social History:  reports that he has never smoked. He has never  used smokeless tobacco. He reports that he does not drink alcohol and does not use drugs.   Physical Exam: BP (!) 140/84   Pulse 86   Ht 5\' 8"  (1.727 m)   Wt 200 lb (90.7 kg)   BMI 30.41 kg/m   Constitutional:  Alert, No acute distress. HEENT: [place] AT Respiratory: Normal respiratory effort, no increased work of breathing. GU: Prostate 60+ cc, smooth without nodules. Psychiatric: Normal mood and affect.   Assessment & Plan:    1. Elevated PSA Benign DRE PSA last week elevated above baseline, however a repeat PSA drawn yesterday was significantly lower. This was a different lab/methodology but is still a large discrepancy.  Will repeat PSA in 1 month. If it remains significantly elevated above baseline, patient was informed we are now doing MR fusion biopsies in [facility] and would recommend a repeat biopsy.   I have reviewed  the above documentation for accuracy and completeness, and I agree with the above.   Riki Altes, MD  [facility] [address] [place], [place] [address] [phone]

## 2023-10-12 ENCOUNTER — Encounter: Payer: Self-pay | Admitting: Urology

## 2023-10-15 ENCOUNTER — Ambulatory Visit (INDEPENDENT_AMBULATORY_CARE_PROVIDER_SITE_OTHER): Payer: Medicare Other | Admitting: Family Medicine

## 2023-10-15 ENCOUNTER — Encounter: Payer: Self-pay | Admitting: Family Medicine

## 2023-10-15 VITALS — BP 116/70 | HR 52 | Ht 66.5 in | Wt 209.0 lb

## 2023-10-15 DIAGNOSIS — Z0000 Encounter for general adult medical examination without abnormal findings: Secondary | ICD-10-CM

## 2023-10-15 DIAGNOSIS — E7800 Pure hypercholesterolemia, unspecified: Secondary | ICD-10-CM

## 2023-10-15 DIAGNOSIS — R7309 Other abnormal glucose: Secondary | ICD-10-CM | POA: Diagnosis not present

## 2023-10-15 DIAGNOSIS — Z23 Encounter for immunization: Secondary | ICD-10-CM

## 2023-10-15 DIAGNOSIS — E66811 Obesity, class 1: Secondary | ICD-10-CM

## 2023-10-15 NOTE — Patient Instructions (Addendum)
Thank you for coming to the office today.  Recent Labs    03/23/23 0801 10/10/23 0813  HGBA1C 6.0* 5.9*   Excellent work overall, blood sugar is very well controlled.  Keep taking Rosuvastatin medication for cholesterol. It was slightly higher now since missed the medication.  Flu Shot today  Consider other vaccines at the pharmacy.   You have been referred for a Coronary Calcium Score Cardiac CT Scan. This is a screening test for patients aged 70-50+ with cardiovascular risk factors or who are healthy but would be interested in Cardiovascular Screening for heart disease. Even if there is a family history of heart disease, this imaging can be useful. Typically it can be done every 5+ years or at a different timeline we agree on  The scan will look at the chest and mainly focus on the heart and identify early signs of calcium build up or blockages within the heart arteries. It is not 100% accurate for identifying blockages or heart disease, but it is useful to help [place] predict who may have some early changes or be at risk in the future for a heart attack or cardiovascular problem.  The results are reviewed by a Cardiologist and they will document the results. It should become available on MyChart. Typically the results are divided into percentiles based on other patients of the same demographic and age. So it will compare your risk to others similar to you. If you have a higher score >99 or higher percentile >75%tile, it is recommended to consider Statin cholesterol therapy and or referral to Cardiologist. I will try to help explain your results and if we have questions we can contact the Cardiologist.  You will be contacted for scheduling. Usually it is done at any imaging facility through [facility], [facility] or [facility].  The cost is $99 flat fee total and it does not go through insurance, so no authorization is required.   Please schedule a  Follow-up Appointment to: Return in about 6 months (around 04/14/2024) for 6 month PreDM A1c, Urology updates (prefer any day 8-820 AM).  If you have any other questions or concerns, please feel free to call the office or send a message through MyChart. You may also schedule an earlier appointment if necessary.  Additionally, you may be receiving a survey about your experience at our office within a few days to 1 week by e-mail or mail. We value your feedback.  Saralyn Pilar, DO [facility], [place]

## 2023-10-15 NOTE — Progress Notes (Signed)
Subjective:    Patient ID: Walter Hebert, male    DOB: 1951/07/05, 72 y.o.   MRN: [id number]  Walter Hebert is a 72 y.o. male presenting on 10/15/2023 for Annual Exam   HPI  Here for Annual Physical and Lab Review  Discussed the use of AI scribe software for clinical note transcription with the patient, who gave verbal consent to proceed.  The patient, with a history of prostate issues, hyperlipidemia, and prediabetes, presents for a routine check-up.    Elevated A1c / Obesity BMI >33 / Elevated BP without HTN   Last lab A1c down to 5.9 (previous 6.0) Weight stable to down 209, lbs with goal to reach 200  lbs Today reports doing well on improved lifestyle still Lifestyle - Diet: Improved low carb low sugar diet - Exercise: Goal to resume exercise + work Denies hypoglycemia  BPH LUTS / History of High Grade Prostate Intraepithelial Neoplasia Elevated PSA Followed by [facility], prior biopsy, see their records. Prostate biopsy has shown high grade intraepithelial neoplasia but no confirmed cancer Update from Urology with PSA elevated up to 10.8 (10/03/23) then 1 week later had repeat with PSA down to 5.67 (10/10/23), Urologist seen on 10/24 and they were unsure why it changed so quickly, and unsure if results were accurate, request to repeat one more PSA   HYPERLIPIDEMIA Last lipid 09/2023 with LDL up to 129, he admits intermittent adherence dosing on Statin, but has med and ready to restart He will resume on Rosuvastatin 5mg  nightly tolerating without myalgia  The patient reports improved sleep recently and denies any current issues with muscle tension or discomfort. He has not been taking his prescribed muscle relaxant regularly due to this improvement.  Health Maintenance: Flu Shot today  Colonoscopy done 2016, next due in 2026  Decline Shingrix COVID vaccines  UTD Pneumonia vaccine     10/15/2023    8:27 AM 04/02/2023    8:45 AM 02/02/2023    2:59 PM  Depression  screen PHQ 2/9  Decreased Interest 0 0 0  Down, Depressed, Hopeless 0 0 0  PHQ - 2 Score 0 0 0  Altered sleeping 0 0 0  Tired, decreased energy 0 0 0  Change in appetite 0 0 0  Feeling bad or failure about yourself  0 0 0  Trouble concentrating 0 0 0  Moving slowly or fidgety/restless 0 0 0  Suicidal thoughts 0 0 0  PHQ-9 Score 0 0 0  Difficult doing work/chores Not difficult at all  Not difficult at all    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 on biopsy may 2015   Night sweats    Synovial cyst    Past Surgical History:  Procedure Laterality Date   COLONOSCOPY N/A 05/18/2015   Procedure: COLONOSCOPY;  Surgeon: Midge Minium, MD;  Location: [facility] ENDOSCOPY;  Service: Endoscopy;  Laterality: N/A;   Social History   Socioeconomic History   Marital status: Married    Spouse name: Not on file   Number of children: Not on file   Years of education: Not on file   Highest education level: Not on file  Occupational History   Not on file  Tobacco Use   Smoking status: Never   Smokeless tobacco: Never  Vaping Use   Vaping status: Never Used  Substance and Sexual Activity  Alcohol use: No    Comment: Quit- 1990   Drug use: No   Sexual activity: Not on file  Other Topics Concern   Not on file  Social History Narrative   Not on file   Social Determinants of Health   Financial Resource Strain: Low Risk  (02/02/2023)   Overall Financial Resource Strain (CARDIA)    Difficulty of Paying Living Expenses: Not hard at all  Food Insecurity: No Food Insecurity (02/02/2023)   Hunger Vital Sign    Worried About Running Out of Food in the Last Year: Never true    Ran Out of Food in the Last Year: Never true  Transportation Needs: No Transportation Needs (02/02/2023)   PRAPARE - Administrator, Civil Service (Medical): No    Lack of Transportation (Non-Medical): No   Physical Activity: Sufficiently Active (02/02/2023)   Exercise Vital Sign    Days of Exercise per Week: 5 days    Minutes of Exercise per Session: 60 min  Stress: No Stress Concern Present (02/02/2023)   [employer] of Occupational Health - Occupational Stress Questionnaire    Feeling of Stress : Not at all  Social Connections: Moderately Integrated (02/02/2023)   Social Connection and Isolation Panel [NHANES]    Frequency of Communication with Friends and Family: Twice a week    Frequency of Social Gatherings with Friends and Family: Twice a week    Attends Religious Services: More than 4 times per year    Active Member of [employer] or Organizations: No    Attends Banker Meetings: Never    Marital Status: Married  Catering manager Violence: Not At Risk (02/02/2023)   Humiliation, Afraid, Rape, and Kick questionnaire    Fear of Current or Ex-Partner: No    Emotionally Abused: No    Physically Abused: No    Sexually Abused: No   Family History  Problem Relation Age of Onset   Hyperlipidemia Other    Prostate cancer Brother        DECEASED   Healthy Father    Healthy Mother    Kidney disease Neg Hx    Current Outpatient Medications on File Prior to Visit  Medication Sig   Ascorbic Acid (VITAMIN C PO) Take 1 capsule daily by mouth.    baclofen (LIORESAL) 10 MG tablet Take 0.5-1 tablets (5-10 mg total) by mouth 3 (three) times daily as needed for muscle spasms.   Multiple Vitamin (MULTIVITAMIN) capsule Take 1 capsule by mouth daily. Reported on 12/24/2015   rosuvastatin (CRESTOR) 5 MG tablet TAKE 1 TABLET BY MOUTH EVERYDAY AT BEDTIME   sildenafil (VIAGRA) 100 MG tablet Take 1 tablet (100 mg total) by mouth daily as needed for erectile dysfunction. Take two hours prior to intercourse on an empty stomach   VITAMIN E PO Take by mouth.   No current facility-administered medications on file prior to visit.    Review of Systems  Constitutional:  Negative for activity  change, appetite change, chills, diaphoresis, fatigue and fever.  HENT:  Negative for congestion and hearing loss.   Eyes:  Negative for visual disturbance.  Respiratory:  Negative for cough, chest tightness, shortness of breath and wheezing.   Cardiovascular:  Negative for chest pain, palpitations and leg swelling.  Gastrointestinal:  Negative for abdominal pain, constipation, diarrhea, nausea and vomiting.  Genitourinary:  Negative for dysuria, frequency and hematuria.  Musculoskeletal:  Negative for arthralgias and neck pain.  Skin:  Negative for rash.  Neurological:  Negative for dizziness, weakness, light-headedness, numbness and headaches.  Hematological:  Negative for adenopathy.  Psychiatric/Behavioral:  Negative for behavioral problems, dysphoric mood and sleep disturbance.    Per HPI unless specifically indicated above     Objective:    BP 116/70   Pulse (!) 52   Ht 5' 6.5" (1.689 m)   Wt 209 lb (94.8 kg)   SpO2 100%   BMI 33.23 kg/m   Wt Readings from Last 3 Encounters:  10/15/23 209 lb (94.8 kg)  10/11/23 200 lb (90.7 kg)  04/02/23 211 lb (95.7 kg)    Physical Exam Vitals and nursing note reviewed.  Constitutional:      General: He is not in acute distress.    Appearance: He is well-developed. He is not diaphoretic.     Comments: Well-appearing, comfortable, cooperative  HENT:     Head: Normocephalic and atraumatic.  Eyes:     General:        Right eye: No discharge.        Left eye: No discharge.     Conjunctiva/sclera: Conjunctivae normal.     Pupils: Pupils are equal, round, and reactive to light.  Neck:     Thyroid: No thyromegaly.     Vascular: No carotid bruit.  Cardiovascular:     Rate and Rhythm: Normal rate and regular rhythm.     Pulses: Normal pulses.     Heart sounds: Normal heart sounds. No murmur heard. Pulmonary:     Effort: Pulmonary effort is normal. No respiratory distress.     Breath sounds: Normal breath sounds. No wheezing or  rales.  Abdominal:     General: Bowel sounds are normal. There is no distension.     Palpations: Abdomen is soft. There is no mass.     Tenderness: There is no abdominal tenderness.  Musculoskeletal:        General: No tenderness. Normal range of motion.     Cervical back: Normal range of motion and neck supple.     Right lower leg: No edema.     Left lower leg: No edema.     Comments: Upper / Lower Extremities: - Normal muscle tone, strength bilateral upper extremities 5/5, lower extremities 5/5  Lymphadenopathy:     Cervical: No cervical adenopathy.  Skin:    General: Skin is warm and dry.     Findings: No erythema or rash.  Neurological:     Mental Status: He is alert and oriented to person, place, and time.     Comments: Distal sensation intact to light touch all extremities  Psychiatric:        Mood and Affect: Mood normal.        Behavior: Behavior normal.        Thought Content: Thought content normal.     Comments: Well groomed, good eye contact, normal speech and thoughts      Results for orders placed or performed in visit on 10/09/23  PSA  Result Value Ref Range   PSA 5.67 (H) < OR = 4.00 ng/mL  TSH  Result Value Ref Range   TSH 2.72 0.40 - 4.50 mIU/L  Lipid panel  Result Value Ref Range   Cholesterol 215 (H) <200 mg/dL   HDL 70 > OR = 40 mg/dL   Triglycerides 67 <130 mg/dL   LDL Cholesterol (Calc) 129 (H) mg/dL (calc)   Total CHOL/HDL Ratio 3.1 <5.0 (calc)   Non-HDL Cholesterol (Calc) 145 (H) <130 mg/dL (calc)  Hemoglobin  A1c  Result Value Ref Range   Hgb A1c MFr Bld 5.9 (H) <5.7 % of total Hgb   Mean Plasma Glucose 123 mg/dL   eAG (mmol/L) 6.8 mmol/L  CBC with Differential/Platelet  Result Value Ref Range   WBC 4.0 3.8 - 10.8 Thousand/uL   RBC 6.12 (H) 4.20 - 5.80 Million/uL   Hemoglobin 14.7 13.2 - 17.1 g/dL   HCT 93.8 10.1 - 75.1 %   MCV 77.6 (L) 80.0 - 100.0 fL   MCH 24.0 (L) 27.0 - 33.0 pg   MCHC 30.9 (L) 32.0 - 36.0 g/dL   RDW 02.5 85.2 -  77.8 %   Platelets 166 140 - 400 Thousand/uL   MPV 10.9 7.5 - 12.5 fL   Neutro Abs 996 (L) 1,500 - 7,800 cells/uL   Absolute Lymphocytes 2,428 850 - 3,900 cells/uL   Absolute Monocytes 432 200 - 950 cells/uL   Eosinophils Absolute 112 15 - 500 cells/uL   Basophils Absolute 32 0 - 200 cells/uL   Neutrophils Relative % 24.9 %   Total Lymphocyte 60.7 %   Monocytes Relative 10.8 %   Eosinophils Relative 2.8 %   Basophils Relative 0.8 %  COMPLETE METABOLIC PANEL WITH GFR  Result Value Ref Range   Glucose, Bld 90 65 - 99 mg/dL   BUN 23 7 - 25 mg/dL   Creat 2.42 3.53 - 6.14 mg/dL   eGFR 68 > OR = 60 ER/XVQ/0.08Q7   BUN/Creatinine Ratio SEE NOTE: 6 - 22 (calc)   Sodium 141 135 - 146 mmol/L   Potassium 4.4 3.5 - 5.3 mmol/L   Chloride 104 98 - 110 mmol/L   CO2 26 20 - 32 mmol/L   Calcium 9.7 8.6 - 10.3 mg/dL   Total Protein 7.5 6.1 - 8.1 g/dL   Albumin 4.3 3.6 - 5.1 g/dL   Globulin 3.2 1.9 - 3.7 g/dL (calc)   AG Ratio 1.3 1.0 - 2.5 (calc)   Total Bilirubin 0.6 0.2 - 1.2 mg/dL   Alkaline phosphatase (APISO) 66 35 - 144 U/L   AST 23 10 - 35 U/L   ALT 19 9 - 46 U/L      Assessment & Plan:   Problem List Items Addressed This Visit     Elevated hemoglobin A1c   Hyperlipidemia   Relevant Orders   CT CARDIAC SCORING (SELF PAY ONLY)   Obesity (BMI 30.0-34.9)   Other Visit Diagnoses     Annual physical exam    -  Primary   Need for influenza vaccination       Relevant Orders   Flu Vaccine Trivalent High Dose (Fluad)       Updated Health Maintenance information Reviewed recent lab results with patient Encouraged improvement to lifestyle with diet and exercise Goal of weight loss  Assessment and Plan    Elevated PSA Recent fluctuation in PSA levels from 10.8 to 5.67 within a week. Urologist seen and recommended repeat PSA in a month due to uncertainty of results. -Repeat PSA as recommended by urologist.  Hyperlipidemia LDL increased from 95 to 129, patient admits to non  adherent intermittent dosing of Rosuvastatin. -Resume regular dosing of Rosuvastatin.  Prediabetes A1C of 5.9, indicating good control. Now < 6.0 -Continue current management with lifestyle. Not on medication  Weight Management Current weight 209 lbs with a goal to reach 200 lbs. -Continue current efforts diet and exercise to reach weight goal.  General Health Maintenance -Administer influenza vaccine today. -Consider COVID booster and shingles vaccine  at pharmacy.  -Order Coronary Calcium CT heart scan to check for blockages in arteries.       Orders Placed This Encounter  Procedures   CT CARDIAC SCORING (SELF PAY ONLY)    Standing Status:   Future    Standing Expiration Date:   10/14/2024    Order Specific Question:   Preferred imaging location?    Answer:   [facility]   Flu Vaccine Trivalent High Dose (Fluad)     No orders of the defined types were placed in this encounter.     Follow up plan: Return in about 6 months (around 04/14/2024) for 6 month PreDM A1c, Urology updates (prefer any day 8-820 AM).  Saralyn Pilar, DO [facility] 10/15/2023, 8:34 AM

## 2023-10-22 ENCOUNTER — Other Ambulatory Visit: Payer: Medicare Other

## 2023-11-13 ENCOUNTER — Other Ambulatory Visit: Payer: Medicare Other

## 2023-11-13 DIAGNOSIS — R972 Elevated prostate specific antigen [PSA]: Secondary | ICD-10-CM

## 2023-11-14 ENCOUNTER — Other Ambulatory Visit: Payer: Self-pay | Admitting: *Deleted

## 2023-11-14 DIAGNOSIS — R972 Elevated prostate specific antigen [PSA]: Secondary | ICD-10-CM

## 2023-11-14 LAB — PSA: Prostate Specific Ag, Serum: 11.7 ng/mL — ABNORMAL HIGH (ref 0.0–4.0)

## 2023-11-19 ENCOUNTER — Ambulatory Visit: Payer: Self-pay | Admitting: *Deleted

## 2023-11-19 NOTE — Telephone Encounter (Signed)
Message from Loveland H sent at 11/19/2023  8:26 AM EST  Summary: insomnia   Pt states since Thanksgiving Day he has not been able to sleep.  He needs appt.  Advised pt first available is 12/10.  He said so I am supposed to stay awake all that time until 12/10?  So that is when I said I will send a message.          Call History  Contact Date/Time Type Contact Phone/Fax User  11/19/2023 08:23 AM EST Phone (Incoming) Sitzman, Shashank A (Self) [phone] Rexene Edison) Crist Infante   Reason for Disposition  [1] Insomnia persists > 1 week AND [2] no improvement after using Care Advice  Answer Assessment - Initial Assessment Questions 1. DESCRIPTION: "Tell me about your sleeping problem."      I'm having trouble sleeping.   I tried Melatonin 3 mg since Thanksgiving Day.    It's not working 2. ONSET: "How long have you been having trouble sleeping?" (e.g., days, weeks, months)     Since Thanksgiving Day.   I keep waking up.  I don't want to take any more Melatonin.   I don't want to abuse it.    I just want to talk with the dr. 3. RECURRENT: "Have you had sleeping problems before?"  If Yes, ask: "What happened that time?" "What helped your sleeping problem go away in the past?"      Yes 4. STRESS: "Is there anything in your life that is making you feel stressed or tense?"     No 5. PAIN: "Do you have any pain that is keeping you awake?" (e.g., back pain, headache, abdomen pain)     No 6. CAFFEINE ABUSE: "Do you drink caffeinated beverages, and how much each day?" (e.g., coffee, tea, colas)     No 7. ALCOHOL USE OR SUBSTANCE USE (DRUG USE): "Do you drink alcohol or use any illegal drugs?"     No 8. OTHER SYMPTOMS: "Do you have any other symptoms?"  (e.g., difficulty breathing)     No  Protocols used: Insomnia-A-AH

## 2023-11-19 NOTE — Telephone Encounter (Signed)
  Chief Complaint: Insomnia Symptoms: Not sleeping well   Hard time falling asleep and staying asleep Frequency: Since Thanksgiving Day Pertinent Negatives: Patient denies Pain, alcohol, being stressed about anything.  Melatonin 3 mg not helping.   Advise he could take 2 of the 3 mg tablets to see if that would help.   Follow the directions on the bottle for dosing. Disposition: [] ED /[] Urgent Care (no appt availability in office) / [x] Appointment(In office/virtual)/ []  [place] [place] [facility]/ [] Home Care/ [] Refused Recommended Disposition /[] [facility]/ []  Follow-up with PCP Additional Notes: Appt made with Dr. Althea Charon for 11/27/2023 at 3:40.

## 2023-11-22 ENCOUNTER — Ambulatory Visit
Admission: RE | Admit: 2023-11-22 | Discharge: 2023-11-22 | Disposition: A | Discharge status: Home / Self Care | Payer: Medicare Other | Source: Ambulatory Visit | Attending: Urology | Admitting: Urology

## 2023-11-22 DIAGNOSIS — R972 Elevated prostate specific antigen [PSA]: Secondary | ICD-10-CM | POA: Insufficient documentation

## 2023-11-22 MED ORDER — GADOBUTROL 1 MMOL/ML IV SOLN
10.0000 mL | Freq: Once | INTRAVENOUS | Status: AC | PRN
  Administered 2023-11-22: 10 mL via INTRAVENOUS

## 2023-11-27 ENCOUNTER — Encounter: Payer: Self-pay | Admitting: Family Medicine

## 2023-11-27 ENCOUNTER — Ambulatory Visit (INDEPENDENT_AMBULATORY_CARE_PROVIDER_SITE_OTHER): Payer: Medicare Other | Admitting: Family Medicine

## 2023-11-27 VITALS — BP 110/78 | HR 76 | Ht 66.5 in | Wt 211.0 lb

## 2023-11-27 DIAGNOSIS — F5101 Primary insomnia: Secondary | ICD-10-CM | POA: Diagnosis not present

## 2023-11-27 NOTE — Patient Instructions (Addendum)
Thank you for coming to the office today.  Recommend increase dose Melatonin start with 5mg  nightly, and then after a few days if still not effective, go up to 10mg  nightly.  If still difficulty sleeping, please call back or message, and I can order the Trazodone 50mg  nightly every night 30 min before bed and it can help reset. Can stop taking after a few weeks or take it ONLY AS NEEDED  Sleep Hygiene Recommendations to promote healthy sleep in all patients, especially if symptoms of insomnia are worsening. Due to the nature of sleep rhythms, if your body gets "out of rhythm", it may take some time before your sleep cycle can be "reset".  Please try to follow as many of the following tips as you can, usually there are only a few of these are the primary cause of the problem.  ?To reset your sleep rhythm, go to bed and get up at the same time every day ?Sleep only long enough to feel rested and then get out of bed ?Do not try to force yourself to sleep. If you can't sleep, get out of bed and try again later. ?Avoid naps during the day, unless excessively tired. The more sleeping during the day, then the less sleep your body needs at night.  ?Have coffee, tea, and other foods that have caffeine only in the morning ?Exercise several days a week, but not right before bed ?If you drink alcohol, prefer to have appropriate drink with one meal, but prefer to avoid alcohol in the evening, and bedtime ?If you smoke, avoid smoking, especially in the evening  ?Avoid watching TV or looking at phones, computers, or reading devices ("e-books") that give off light at least 30 minutes before bed. This artificial light sends "awake signals" to your brain and can make it harder to fall asleep. ?Make your bedroom a comfortable place where it is easy to fall asleep: Put up shades or special blackout curtains to block light from outside. Use a white noise machine to block noise. Keep the temperature cool. ?Try  your best to solve or at least address your problems before you go to bed ?Use relaxation techniques to manage stress. Ask your health care provider to suggest some techniques that may work well for you. These may include: Breathing exercises. Routines to release muscle tension. Visualizing peaceful scenes.    Please schedule a Follow-up Appointment to: Return if symptoms worsen or fail to improve.  If you have any other questions or concerns, please feel free to call the office or send a message through MyChart. You may also schedule an earlier appointment if necessary.  Additionally, you may be receiving a survey about your experience at our office within a few days to 1 week by e-mail or mail. We value your feedback.  Saralyn Pilar, DO [facility], [place]

## 2023-11-27 NOTE — Progress Notes (Signed)
Subjective:    Patient ID: Walter Hebert, male    DOB: 1950-12-30, 72 y.o.   MRN: [id number]  Walter Hebert is a 72 y.o. male presenting on 11/27/2023 for Acute Visit (Insomnia difficulty sleeping )   HPI  Discussed the use of AI scribe software for clinical note transcription with the patient, who gave verbal consent to proceed.  Insomnia  The patient, with a recent onset of sleep disturbances, reports difficulty falling asleep and staying asleep for more than ten minutes. This has been ongoing for approximately two weeks. The patient denies any significant changes in lifestyle or diet that could have triggered this, except for a morning beverage of garlic, ginger, and lemon. There is no intake of caffeine, nicotine, or alcohol. The patient denies any physical discomfort, breathing difficulties, or significant mental stress that could be contributing to the sleep disturbances.  The patient sought advice from a family member who is a doctor in [place], who recommended increased water intake. This advice seemed to provide some relief, but the patient still reports not achieving the desired quality of sleep. The patient has tried melatonin (3mg ) without significant improvement. The patient denies any urinary disturbances except for increased frequency due to increased water intake. The patient has no history of sleep disorders and has not previously tried any prescription sleep medications.          11/27/2023    4:11 PM 10/15/2023    8:27 AM 04/02/2023    8:45 AM  Depression screen PHQ 2/9  Decreased Interest  0 0  Down, Depressed, Hopeless 0 0 0  PHQ - 2 Score 0 0 0  Altered sleeping 1 0 0  Tired, decreased energy 0 0 0  Change in appetite 0 0 0  Feeling bad or failure about yourself  0 0 0  Trouble concentrating 0 0 0  Moving slowly or fidgety/restless 0 0 0  Suicidal thoughts 0 0 0  PHQ-9 Score 1 0 0  Difficult doing work/chores  Not difficult at all        11/27/2023    4:11 PM  10/15/2023    8:27 AM 04/02/2023    8:46 AM 09/26/2022    9:54 AM  GAD 7 : Generalized Anxiety Score  Nervous, Anxious, on Edge 0 0 0 0  Control/stop worrying 0 0 0 0  Worry too much - different things 0 0 0 0  Trouble relaxing 0 0 0 0  Restless 0 0 0 0  Easily annoyed or irritable 0 0 0 0  Afraid - awful might happen 0 0 0 0  Total GAD 7 Score 0 0 0 0  Anxiety Difficulty    Not difficult at all    Social History   Tobacco Use   Smoking status: Never   Smokeless tobacco: Never  Vaping Use   Vaping status: Never Used  Substance Use Topics   Alcohol use: No    Comment: Quit- 1990   Drug use: No    Review of Systems Per HPI unless specifically indicated above     Objective:    BP 110/78   Pulse 76   Ht 5' 6.5" (1.689 m)   Wt 211 lb (95.7 kg)   BMI 33.55 kg/m   Wt Readings from Last 3 Encounters:  11/27/23 211 lb (95.7 kg)  10/15/23 209 lb (94.8 kg)  10/11/23 200 lb (90.7 kg)    Physical Exam Vitals and nursing note reviewed.  Constitutional:  General: He is not in acute distress.    Appearance: Normal appearance. He is well-developed. He is not diaphoretic.     Comments: Well-appearing, comfortable, cooperative  HENT:     Head: Normocephalic and atraumatic.  Eyes:     General:        Right eye: No discharge.        Left eye: No discharge.     Conjunctiva/sclera: Conjunctivae normal.  Cardiovascular:     Rate and Rhythm: Normal rate.  Pulmonary:     Effort: Pulmonary effort is normal.  Skin:    General: Skin is warm and dry.     Findings: No erythema or rash.  Neurological:     Mental Status: He is alert and oriented to person, place, and time.  Psychiatric:        Mood and Affect: Mood normal.        Behavior: Behavior normal.        Thought Content: Thought content normal.     Comments: Well groomed, good eye contact, normal speech and thoughts     Results for orders placed or performed in visit on 11/13/23  PSA  Result Value Ref Range    Prostate Specific Ag, Serum 11.7 (H) 0.0 - 4.0 ng/mL      Assessment & Plan:   Problem List Items Addressed This Visit     Insomnia - Primary      Insomnia Difficulty falling asleep and staying asleep for the past two weeks. No known triggers. No associated pain or breathing difficulties. No significant stressors. No caffeine, nicotine, or alcohol use.  -Increase Melatonin dose to 5mg  nightly. If ineffective after a few days, increase to 10mg  nightly. -If still experiencing difficulty sleeping, patient to call back or message for prescription of Trazodone 50mg  nightly. -Advised to maintain good sleep hygiene, including avoiding screens 30 minutes before bed.         No orders of the defined types were placed in this encounter.   No orders of the defined types were placed in this encounter.   Follow up plan: Return if symptoms worsen or fail to improve.   Saralyn Pilar, DO [facility] 11/27/2023, 4:19 PM

## 2023-11-28 ENCOUNTER — Telehealth: Payer: Self-pay | Admitting: Urology

## 2023-11-28 NOTE — Telephone Encounter (Signed)
I contacted Walter Hebert to discuss Walter Hebert prostate MRI results.  The previously noted PI-RADS 4 lesion in the right PZ was no longer present.  There were 2 PI-RADS 3 lesions in the left posteromedial PZ and left posterolateral PZ.  Walter Hebert PSA density is 0.12.  We discussed PI-RADS 3 lesions are considered indeterminate however the incidence of cancer is low with a PSA density of <0.15.  Recommend continued surveillance and he will follow-up in 6 months with office visit/PSA.

## 2024-02-08 ENCOUNTER — Ambulatory Visit: Payer: Medicare Other

## 2024-02-08 DIAGNOSIS — Z0000 Encounter for general adult medical examination without abnormal findings: Secondary | ICD-10-CM | POA: Diagnosis not present

## 2024-02-08 NOTE — Progress Notes (Signed)
Subjective:   Walter Hebert is a 73 y.o. who presents for a Medicare Wellness preventive visit.  Visit Complete: Virtual I connected with  Walter Hebert on 02/08/24 by a audio enabled telemedicine application and verified that I am speaking with the correct person using two identifiers.  Patient Location: Home  Provider Location: Office/Clinic  I discussed the limitations of evaluation and management by telemedicine. The patient expressed understanding and agreed to proceed.  Vital Signs: Because this visit was a virtual/telehealth visit, some criteria may be missing or patient reported. Any vitals not documented were not able to be obtained and vitals that have been documented are patient reported.  VideoDeclined- This patient declined Librarian, academic. Therefore the visit was completed with audio only.  AWV Questionnaire: No: Patient Medicare AWV questionnaire was not completed prior to this visit.  Cardiac Risk Factors include: advanced age (>24men, >57 women);dyslipidemia;male gender;obesity (BMI >30kg/m2)     Objective:    There were no vitals filed for this visit. There is no height or weight on file to calculate BMI.     02/08/2024   11:38 AM 02/02/2023    3:01 PM 09/14/2021    9:54 AM 09/19/2016    2:52 PM  Advanced Directives  Does Patient Have a Medical Advance Directive? No No No No  Would patient like information on creating a medical advance directive? No - Patient declined No - Patient declined      Current Medications (verified) Outpatient Encounter Medications as of 02/08/2024  Medication Sig   Ascorbic Acid (VITAMIN C PO) Take 1 capsule daily by mouth.    Multiple Vitamin (MULTIVITAMIN) capsule Take 1 capsule by mouth daily. Reported on 12/24/2015   rosuvastatin (CRESTOR) 5 MG tablet TAKE 1 TABLET BY MOUTH EVERYDAY AT BEDTIME   sildenafil (VIAGRA) 100 MG tablet Take 1 tablet (100 mg total) by mouth daily as needed for erectile  dysfunction. Take two hours prior to intercourse on an empty stomach   VITAMIN E PO Take by mouth.   baclofen (LIORESAL) 10 MG tablet Take 0.5-1 tablets (5-10 mg total) by mouth 3 (three) times daily as needed for muscle spasms. (Patient not taking: Reported on 02/08/2024)   No facility-administered encounter medications on file as of 02/08/2024.    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   Night sweats    Synovial cyst    Past Surgical History:  Procedure Laterality Date   COLONOSCOPY N/A 05/18/2015   Procedure: COLONOSCOPY;  Surgeon: Midge Minium, MD;  Location: [facility] ENDOSCOPY;  Service: Endoscopy;  Laterality: N/A;   Family History  Problem Relation Age of Onset   Hyperlipidemia Other    Prostate cancer Brother        DECEASED   Healthy Father    Healthy Mother    Kidney disease Neg Hx    Social History   Socioeconomic History   Marital status: Married    Spouse name: Not on file   Number of children: Not on file   Years of education: Not on file   Highest education level: Not on file  Occupational History   Not on file  Tobacco Use   Smoking status: Never   Smokeless tobacco: Never  Vaping Use   Vaping status: Never Used  Substance and Sexual Activity   Alcohol use: No    Comment: Quit- 1990   Drug use: No   Sexual activity: Not on file  Other Topics Concern   Not on file  Social History Narrative   Not on file   Social Drivers of Health   Financial Resource Strain: Low Risk  (02/08/2024)   Overall Financial Resource Strain (CARDIA)    Difficulty of Paying Living Expenses: Not hard at all  Food Insecurity: No Food Insecurity (02/08/2024)   Hunger Vital Sign    Worried About Running Out of Food in the Last Year: Never true    Ran Out of Food in the Last  Year: Never true  Transportation Needs: No Transportation Needs (02/08/2024)   PRAPARE - Administrator, Civil Service (Medical): No    Lack of Transportation (Non-Medical): No  Physical Activity: Sufficiently Active (02/08/2024)   Exercise Vital Sign    Days of Exercise per Week: 3 days    Minutes of Exercise per Session: 50 min  Stress: No Stress Concern Present (02/08/2024)   [employer] of Occupational Health - Occupational Stress Questionnaire    Feeling of Stress : Not at all  Social Connections: Socially Integrated (02/08/2024)   Social Connection and Isolation Panel [NHANES]    Frequency of Communication with Friends and Family: More than three times a week    Frequency of Social Gatherings with Friends and Family: Once a week    Attends Religious Services: More than 4 times per year    Active Member of [employer] or Organizations: Yes    Attends Engineer, structural: More than 4 times per year    Marital Status: Married    Tobacco Counseling Counseling given: Not Answered    Clinical Intake:  Pre-visit preparation completed: Yes  Pain : No/denies pain     BMI - recorded: 33.5 Nutritional Status: BMI > 30  Obese Nutritional Risks: None Diabetes: No  How often do you need to have someone help you when you read instructions, pamphlets, or other written materials from your doctor or pharmacy?: 1 - Never  Interpreter Needed?: No  Information entered by :: Kennedy Bucker, LPN   Activities of Daily Living     02/08/2024   11:39 AM 10/15/2023    8:28 AM  In your present state of health, do you have any difficulty performing the following activities:  Hearing? 0 0  Vision? 0 0  Difficulty concentrating or making decisions? 0 0  Walking or climbing stairs? 0 0  Dressing or bathing? 0 0  Doing errands, shopping? 0 0  Preparing Food and eating ? N   Using the Toilet? N   In the past six months, have you accidently leaked urine? N   Do you have  problems with loss of bowel control? N   Managing your Medications? N   Managing your Finances? N   Housekeeping or managing your Housekeeping? N     Patient Care Team: Smitty Cords, DO as PCP - General (Family Medicine)  Indicate any recent Medical Services you may have received from other than [facility] providers in the past year (date may be approximate).     Assessment:   This is a routine wellness examination for Walter Hebert.  Hearing/Vision screen Hearing Screening - Comments:: NO AIDS Vision Screening - Comments:: NO GLASSES    Goals Addressed             This Visit's Progress  DIET - INCREASE WATER INTAKE         Depression Screen     02/08/2024   11:36 AM 11/27/2023    4:11 PM 10/15/2023    8:27 AM 04/02/2023    8:45 AM 02/02/2023    2:59 PM 09/26/2022    9:54 AM 05/23/2022    9:55 AM  PHQ 2/9 Scores  PHQ - 2 Score 0 0 0 0 0 0 0  PHQ- 9 Score 0 1 0 0 0 0     Fall Risk     02/08/2024   11:39 AM 11/27/2023    4:11 PM 10/15/2023    8:28 AM 04/02/2023    8:46 AM 02/02/2023    3:01 PM  Fall Risk   Falls in the past year? 0 0 0 0 0  Number falls in past yr: 0    0  Injury with Fall? 0    0  Risk for fall due to : No Fall Risks    No Fall Risks  Follow up Falls prevention discussed;Falls evaluation completed    Falls prevention discussed;Falls evaluation completed    MEDICARE RISK AT HOME:  Medicare Risk at Home Any stairs in or around the home?: No If so, are there any without handrails?: No Home free of loose throw rugs in walkways, pet beds, electrical cords, etc?: Yes Adequate lighting in your home to reduce risk of falls?: Yes Life alert?: No Use of a cane, walker or w/c?: No Grab bars in the bathroom?: No Shower chair or bench in shower?: No Elevated toilet seat or a handicapped toilet?: No  TIMED UP AND GO:  Was the test performed?  No  Cognitive Function: 6CIT completed        02/08/2024   11:40 AM 02/02/2023    3:05 PM  6CIT Screen   What Year? 0 points 0 points  What month? 0 points 0 points  What time? 0 points 0 points  Count back from 20 0 points 0 points  Months in reverse 0 points 0 points  Repeat phrase 0 points 0 points  Total Score 0 points 0 points    Immunizations Immunization History  Administered Date(s) Administered   Fluad Quad(high Dose 65+) 10/09/2019, 10/13/2020, 10/19/2021, 09/01/2022   Fluad Trivalent(High Dose 65+) 10/15/2023   Influenza, High Dose Seasonal PF 09/25/2018   Influenza,inj,Quad PF,6+ Mos 09/19/2016, 09/21/2017   Moderna Sars-Covid-2 Vaccination 06/21/2020, 07/19/2020   Pfizer Covid-19 Vaccine Bivalent Booster 41yrs & up 10/24/2021   Pneumococcal Conjugate-13 09/25/2018   Pneumococcal Polysaccharide-23 10/09/2019   Tdap 09/13/2021    Screening Tests Health Maintenance  Topic Date Due   Zoster Vaccines- Shingrix (1 of 2) Never done   COVID-19 Vaccine (4 - 2024-25 season) 08/19/2023   Medicare Annual Wellness (AWV)  02/07/2025   Colonoscopy  05/17/2025   DTaP/Tdap/Td (2 - Td or Tdap) 09/14/2031   Pneumonia Vaccine 10+ Years old  Completed   INFLUENZA VACCINE  Completed   Hepatitis C Screening  Completed   HPV VACCINES  Aged Out    Health Maintenance  Health Maintenance Due  Topic Date Due   Zoster Vaccines- Shingrix (1 of 2) Never done   COVID-19 Vaccine (4 - 2024-25 season) 08/19/2023   Health Maintenance Items Addressed: UP TO DATE Dellie Burns SHOTS  Additional Screening:  Vision Screening: Recommended annual ophthalmology exams for early detection of glaucoma and other disorders of the eye.  Dental Screening: Recommended annual dental exams for proper oral hygiene  Community Resource Referral / Chronic Care Management: CRR required this visit?  No   CCM required this visit?  No     Plan:     I have personally reviewed and noted the following in the patient's chart:   Medical and social history Use of alcohol, tobacco or illicit drugs   Current medications and supplements including opioid prescriptions. Patient is not currently taking opioid prescriptions. Functional ability and status Nutritional status Physical activity Advanced directives List of other physicians Hospitalizations, surgeries, and ER visits in previous 12 months Vitals Screenings to include cognitive, depression, and falls Referrals and appointments  In addition, I have reviewed and discussed with patient certain preventive protocols, quality metrics, and best practice recommendations. A written personalized care plan for preventive services as well as general preventive health recommendations were provided to patient.     Hal Hope, LPN   9/60/4540   After Visit Summary: (MyChart) Due to this being a telephonic visit, the after visit summary with patients personalized plan was offered to patient via MyChart   Notes: Nothing significant to report at this time.

## 2024-02-08 NOTE — Patient Instructions (Addendum)
Walter Hebert , Thank you for taking time to come for your Medicare Wellness Visit. I appreciate your ongoing commitment to your health goals. Please review the following plan we discussed and let me know if I can assist you in the future.   Referrals/Orders/Follow-Ups/Clinician Recommendations: NONE  This is a list of the screening recommended for you and due dates:  Health Maintenance  Topic Date Due   Zoster (Shingles) Vaccine (1 of 2) Never done   COVID-19 Vaccine (4 - 2024-25 season) 08/19/2023   Medicare Annual Wellness Visit  02/07/2025   Colon Cancer Screening  05/17/2025   DTaP/Tdap/Td vaccine (2 - Td or Tdap) 09/14/2031   Pneumonia Vaccine  Completed   Flu Shot  Completed   Hepatitis C Screening  Completed   HPV Vaccine  Aged Out    Advanced directives: (ACP Link)Information on Advanced Care Planning can be found at [facility] Advance Health Care Directives Advance Health Care Directives ([email or web address]   Next Medicare Annual Wellness Visit scheduled for next year: Yes   02/13/25 @ 11:30 am by phone

## 2024-03-24 DIAGNOSIS — H2513 Age-related nuclear cataract, bilateral: Secondary | ICD-10-CM | POA: Diagnosis not present

## 2024-03-24 DIAGNOSIS — H52223 Regular astigmatism, bilateral: Secondary | ICD-10-CM | POA: Diagnosis not present

## 2024-03-24 DIAGNOSIS — H5203 Hypermetropia, bilateral: Secondary | ICD-10-CM | POA: Diagnosis not present

## 2024-03-24 DIAGNOSIS — H43813 Vitreous degeneration, bilateral: Secondary | ICD-10-CM | POA: Diagnosis not present

## 2024-04-15 ENCOUNTER — Other Ambulatory Visit: Payer: Self-pay | Admitting: Family Medicine

## 2024-04-15 ENCOUNTER — Encounter: Payer: Self-pay | Admitting: Family Medicine

## 2024-04-15 ENCOUNTER — Ambulatory Visit (INDEPENDENT_AMBULATORY_CARE_PROVIDER_SITE_OTHER): Payer: Self-pay | Admitting: Family Medicine

## 2024-04-15 VITALS — BP 134/70 | HR 52 | Ht 66.5 in | Wt 210.2 lb

## 2024-04-15 DIAGNOSIS — E66811 Obesity, class 1: Secondary | ICD-10-CM | POA: Diagnosis not present

## 2024-04-15 DIAGNOSIS — Z23 Encounter for immunization: Secondary | ICD-10-CM

## 2024-04-15 DIAGNOSIS — R7309 Other abnormal glucose: Secondary | ICD-10-CM

## 2024-04-15 DIAGNOSIS — F5101 Primary insomnia: Secondary | ICD-10-CM

## 2024-04-15 DIAGNOSIS — E7800 Pure hypercholesterolemia, unspecified: Secondary | ICD-10-CM

## 2024-04-15 DIAGNOSIS — Z0000 Encounter for general adult medical examination without abnormal findings: Secondary | ICD-10-CM

## 2024-04-15 DIAGNOSIS — R972 Elevated prostate specific antigen [PSA]: Secondary | ICD-10-CM

## 2024-04-15 DIAGNOSIS — N138 Other obstructive and reflux uropathy: Secondary | ICD-10-CM

## 2024-04-15 LAB — POCT GLYCOSYLATED HEMOGLOBIN (HGB A1C): Hemoglobin A1C: 5.8 % — AB (ref 4.0–5.6)

## 2024-04-15 NOTE — Patient Instructions (Addendum)
 Thank you for coming to the office today.  Recent Labs    10/10/23 0813 04/15/24 0817  HGBA1C 5.9* 5.8*   Prevnar-20 vaccine today pneumonia shot  BP is controlled  Weight stable  Continue current therapy.  Contact if need assistance with sleep or other questions.  DUE for FASTING BLOOD WORK (no food or drink after midnight before the lab appointment, only water or coffee without cream/sugar on the morning of)  SCHEDULE "Lab Only" visit in the morning at the clinic for lab draw in 6 months  - Make sure Lab Only appointment is at about 1 week before your next appointment, so that results will be available  For Lab Results, once available within 2-3 days of blood draw, you can can log in to MyChart online to view your results and a brief explanation. Also, we can discuss results at next follow-up visit.   Please schedule a Follow-up Appointment to: Return for 6 month fasting lab > 1 week later Annual Physical.  If you have any other questions or concerns, please feel free to call the office or send a message through MyChart. You may also schedule an earlier appointment if necessary.  Additionally, you may be receiving a survey about your experience at our office within a few days to 1 week by e-mail or mail. We value your feedback.  Domingo Friend, DO [facility], [place]

## 2024-04-15 NOTE — Progress Notes (Signed)
 Subjective:    Patient ID: Walter Hebert, male    DOB: 1951/02/07, 73 y.o.   MRN: [id number]  Walter Hebert is a 73 y.o. male presenting on 04/15/2024 for Pre-Diabetes   HPI  Discussed the use of AI scribe software for clinical note transcription with the patient, who gave verbal consent to proceed.  History of Present Illness   Deidrick A Pankratz is a 73 year old male who presents for a follow-up visit to check blood sugar levels and discuss sleep issues.   Insomnia He previously experienced difficulty falling asleep, which was discussed in December. He reports a significant improvement in his sleep without the need for continued use of melatonin, which he stopped using shortly after the last visit. - Now sleeping well without any issues at all - He feels rested. No new complaint     Elevated A1c / Obesity BMI >33 / Elevated BP without HTN He feels well overall and is here to check his blood sugar levels. His blood sugar has improved, with a recent result of 5.8, down from 5.9 six months ago. Weight stable 210 lbs Today reports doing well on improved lifestyle still Lifestyle - Diet: Improved low carb low sugar diet Denies hypoglycemia   Followed by Urology for prostate surveillance, had MRI prior. Upcoming follow up and PSA   Health Maintenance: Due Prevnar 20 today  Did not receive scheduled Coronary CT Calcium  score     04/15/2024    8:12 AM 02/08/2024   11:36 AM 11/27/2023    4:11 PM  Depression screen PHQ 2/9  Decreased Interest 0 0   Down, Depressed, Hopeless 0 0 0  PHQ - 2 Score 0 0 0  Altered sleeping 0 0 1  Tired, decreased energy 0 0 0  Change in appetite 0 0 0  Feeling bad or failure about yourself  0 0 0  Trouble concentrating 0 0 0  Moving slowly or fidgety/restless 0 0 0  Suicidal thoughts 0 0 0  PHQ-9 Score 0 0 1  Difficult doing work/chores  Not difficult at all        04/15/2024    8:12 AM 11/27/2023    4:11 PM 10/15/2023    8:27 AM 04/02/2023    8:46  AM  GAD 7 : Generalized Anxiety Score  Nervous, Anxious, on Edge 0 0 0 0  Control/stop worrying 0 0 0 0  Worry too much - different things 0 0 0 0  Trouble relaxing 0 0 0 0  Restless 0 0 0 0  Easily annoyed or irritable 0 0 0 0  Afraid - awful might happen 0 0 0 0  Total GAD 7 Score 0 0 0 0    Social History   Tobacco Use   Smoking status: Never   Smokeless tobacco: Never  Vaping Use   Vaping status: Never Used  Substance Use Topics   Alcohol use: No    Comment: Quit- 1990   Drug use: No    Review of Systems Per HPI unless specifically indicated above     Objective:    BP 134/70 (BP Location: Left Arm, Patient Position: Sitting, Cuff Size: Normal)   Pulse (!) 52   Ht 5' 6.5" (1.689 m)   Wt 210 lb 4 oz (95.4 kg)   SpO2 99%   BMI 33.43 kg/m   Wt Readings from Last 3 Encounters:  04/15/24 210 lb 4 oz (95.4 kg)  11/27/23 211 lb (95.7 kg)  10/15/23 209 lb (94.8 kg)    Physical Exam Vitals and nursing note reviewed.  Constitutional:      General: He is not in acute distress.    Appearance: Normal appearance. He is well-developed. He is not diaphoretic.     Comments: Well-appearing, comfortable, cooperative  HENT:     Head: Normocephalic and atraumatic.  Eyes:     General:        Right eye: No discharge.        Left eye: No discharge.     Conjunctiva/sclera: Conjunctivae normal.  Cardiovascular:     Rate and Rhythm: Normal rate.  Pulmonary:     Effort: Pulmonary effort is normal.  Skin:    General: Skin is warm and dry.     Findings: No erythema or rash.  Neurological:     Mental Status: He is alert and oriented to person, place, and time.  Psychiatric:        Mood and Affect: Mood normal.        Behavior: Behavior normal.        Thought Content: Thought content normal.     Comments: Well groomed, good eye contact, normal speech and thoughts     Results for orders placed or performed in visit on 04/15/24  POCT HgB A1C   Collection Time: 04/15/24   8:17 AM  Result Value Ref Range   Hemoglobin A1C 5.8 (A) 4.0 - 5.6 %   HbA1c POC (<> result, manual entry)     HbA1c, POC (prediabetic range)     HbA1c, POC (controlled diabetic range)        Assessment & Plan:   Problem List Items Addressed This Visit     Elevated hemoglobin A1c - Primary   Relevant Orders   POCT HgB A1C (Completed)   Insomnia   Obesity (BMI 30.0-34.9)   Other Visit Diagnoses       Need for Streptococcus pneumoniae vaccination       Relevant Orders   Pneumococcal conjugate vaccine 20-valent (Completed)        Pre-Diabetes / Elevated A1c A1c down to 5.8, prior 5.9-6.0 Improved with lifestyle modification diet and exercise  History of Insomnia Sleep improved without medication. No longer on Melatonin Improved sleep hygiene and now able to sleep  - Schedule follow-up in six months for routine check-up, including weight, blood sugar, and blood work.  Pneumonia vaccination Prevnar 20 vaccination recommended and agreed upon due to pneumonia history. - Administer Prevnar 20 vaccination today.  Pending heart scan Heart scan ordered in October not completed. Agreed to reschedule to assess cardiac health. - Reorder heart scan and ensure scheduling. - Note communicated with Cardiology scheduling and it has been re-scheduled for 04/25/24 - Follow up on results via MyChart once completed.        Orders Placed This Encounter  Procedures   Pneumococcal conjugate vaccine 20-valent   POCT HgB A1C    No orders of the defined types were placed in this encounter.   Follow up plan: Return for 6 month fasting lab > 1 week later Annual Physical.  Future labs ordered for 10/15/24  Domingo Friend, DO [facility] 04/15/2024, 8:13 AM

## 2024-04-25 ENCOUNTER — Ambulatory Visit
Admission: RE | Admit: 2024-04-25 | Discharge: 2024-04-25 | Disposition: A | Discharge status: Home / Self Care | Payer: Self-pay | Source: Ambulatory Visit | Attending: Family Medicine | Admitting: Family Medicine

## 2024-04-25 DIAGNOSIS — E7800 Pure hypercholesterolemia, unspecified: Secondary | ICD-10-CM | POA: Insufficient documentation

## 2024-04-29 ENCOUNTER — Ambulatory Visit: Payer: Self-pay | Admitting: Family Medicine

## 2024-05-26 ENCOUNTER — Other Ambulatory Visit: Payer: Self-pay | Admitting: *Deleted

## 2024-05-26 DIAGNOSIS — R972 Elevated prostate specific antigen [PSA]: Secondary | ICD-10-CM

## 2024-05-28 ENCOUNTER — Other Ambulatory Visit: Payer: Self-pay

## 2024-05-29 ENCOUNTER — Other Ambulatory Visit

## 2024-05-29 DIAGNOSIS — R972 Elevated prostate specific antigen [PSA]: Secondary | ICD-10-CM

## 2024-05-30 ENCOUNTER — Ambulatory Visit: Payer: Self-pay | Admitting: Urology

## 2024-05-30 ENCOUNTER — Encounter: Payer: Self-pay | Admitting: Urology

## 2024-05-30 VITALS — BP 126/77 | HR 66 | Ht 68.0 in | Wt 204.0 lb

## 2024-05-30 DIAGNOSIS — R972 Elevated prostate specific antigen [PSA]: Secondary | ICD-10-CM | POA: Diagnosis not present

## 2024-05-30 LAB — PSA: Prostate Specific Ag, Serum: 10.5 ng/mL — ABNORMAL HIGH (ref 0.0–4.0)

## 2024-05-30 NOTE — Patient Instructions (Signed)
 L

## 2024-05-30 NOTE — Progress Notes (Signed)
 I, Maysun Jamey Mccallum, acting as a scribe for Geraline Knapp, MD., have documented all relevant documentation on the behalf of Geraline Knapp, MD, as directed by Geraline Knapp, MD while in the presence of Geraline Knapp, MD.  05/30/2024 2:38 PM   Vladimir Delford Felling 11/10/1951 [id number]  Referring provider: Raina Bunting, DO [address] [place],  [place] [address]  Urologic history: 1.  Elevated PSA Cognitive biopsy 02/2022 PSA 7.5; 75 g volume MRI with PI-RADS 3 and PI-RADS 4 lesions Fusion biopsy could not be performed secondary to insurance Pathology 2 cores high-grade PIN  HPI: Walter Hebert is a 73 y.o. male presents for 6 month follow-up,   No complaints since last visit MRI 11/22/2023 for a PSA bump to 11.7 which showed downgrading of his previously noted PI-RADS 4 lesion. He had 2 PI-RADS 3 lesions in the left peripheral zone. Prostate volume 99.2 cc.  PSA density was 0.12 and surveillance recommended. PSA 05/29/2024 stable at 10.5.  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 If you have any questions, ask your nurse or doctor.          baclofen  10 MG tablet Commonly known as: LIORESAL  Take 0.5-1 tablets (5-10 mg total) by mouth 3 (three) times daily as needed for muscle spasms.   multivitamin capsule Take 1 capsule by mouth daily. Reported on 12/24/2015   rosuvastatin  5 MG tablet Commonly known as: CRESTOR  TAKE 1 TABLET BY MOUTH EVERYDAY AT BEDTIME   sildenafil  100 MG tablet Commonly known as: VIAGRA  Take 1 tablet (100 mg total) by mouth daily as needed for erectile dysfunction. Take two hours prior to intercourse on an empty stomach   VITAMIN C PO Take 1 capsule daily by mouth.   VITAMIN E PO Take by mouth.        Allergies:  Allergies  Allergen Reactions   Chloroquine Itching   Hydroxychloroquine Sulfate Hives    Family History: Family History  Problem Relation Age of Onset   Hyperlipidemia Other    Prostate cancer Brother        DECEASED   Healthy Father    Healthy Mother    Kidney disease Neg Hx     Social History:  reports that he has never smoked. He has  never used smokeless tobacco. He reports that he does not drink alcohol and does not use drugs.   Physical Exam: BP 126/77   Pulse 66   Ht 5' 8 (1.727 m)   Wt 204 lb (92.5 kg)   BMI 31.02 kg/m   Constitutional:  Alert and oriented, No acute distress. HEENT: [place] AT, moist mucus membranes.  Trachea midline, no masses. Cardiovascular: No clubbing, cyanosis, or edema. Respiratory: Normal respiratory effort, no increased work of breathing. GI: Abdomen is soft, nontender, nondistended, no abdominal masses Skin: No rashes, bruises or suspicious lesions. Neurologic: Grossly intact, no focal deficits, moving all 4 extremities. Psychiatric: Normal mood and affect.   Assessment & Plan:    1. Elevated PSA Recent MRI showed no lesion suspicious for high-grade cancer.  Previous benign prostate biopsy, PSA 7.5 Current PSA stable.  6 month follow-up office  visit with PSA/DRE.  I have reviewed the above documentation for accuracy and completeness, and I agree with the above.   Geraline Knapp, MD  [facility] [address], [address] [place], [place] [address] [phone]

## 2024-06-05 DIAGNOSIS — H6123 Impacted cerumen, bilateral: Secondary | ICD-10-CM | POA: Diagnosis not present

## 2024-06-05 DIAGNOSIS — J069 Acute upper respiratory infection, unspecified: Secondary | ICD-10-CM | POA: Diagnosis not present

## 2024-08-07 ENCOUNTER — Other Ambulatory Visit: Payer: Self-pay | Admitting: Family Medicine

## 2024-08-07 DIAGNOSIS — E7800 Pure hypercholesterolemia, unspecified: Secondary | ICD-10-CM

## 2024-08-08 NOTE — Telephone Encounter (Signed)
 Requested Prescriptions  Pending Prescriptions Disp Refills   rosuvastatin  (CRESTOR ) 5 MG tablet [Pharmacy Med Name: ROSUVASTATIN  CALCIUM  5 MG TAB] 90 tablet 0    Sig: TAKE 1 TABLET BY MOUTH EVERYDAY AT BEDTIME     Cardiovascular:  Antilipid - Statins 2 Failed - 08/08/2024  7:58 AM      Failed - Lipid Panel in normal range within the last 12 months    Cholesterol  Date Value Ref Range Status  10/10/2023 215 (H) <200 mg/dL Final   LDL Cholesterol (Calc)  Date Value Ref Range Status  10/10/2023 129 (H) mg/dL (calc) Final    Comment:    Reference range: <100 . Desirable range <100 mg/dL for primary prevention;   <70 mg/dL for patients with CHD or diabetic patients  with > or = 2 CHD risk factors. SABRA LDL-C is now calculated using the Martin-Hopkins  calculation, which is a validated novel method providing  better accuracy than the Friedewald equation in the  estimation of LDL-C.  Gladis APPLETHWAITE et al. SANDREA. 7986;689(80): 2061-2068  (http://education.[facility].com/faq/FAQ164)    HDL  Date Value Ref Range Status  10/10/2023 70 > OR = 40 mg/dL Final   Triglycerides  Date Value Ref Range Status  10/10/2023 67 <150 mg/dL Final         Passed - Cr in normal range and within 360 days    Creat  Date Value Ref Range Status  10/10/2023 1.15 0.70 - 1.28 mg/dL Final         Passed - Patient is not pregnant      Passed - Valid encounter within last 12 months    Recent Outpatient Visits           3 months ago Elevated hemoglobin A1c   [facility] Marsa PARAS, DO       Future Appointments             In 3 months Stoioff, Glendia BROCKS, MD [facility]

## 2024-10-13 ENCOUNTER — Other Ambulatory Visit: Payer: Self-pay | Admitting: Pharmacist

## 2024-10-13 DIAGNOSIS — E7800 Pure hypercholesterolemia, unspecified: Secondary | ICD-10-CM

## 2024-10-13 NOTE — Progress Notes (Signed)
   10/13/2024  Patient ID: Walter Hebert, male   DOB: 07-Mar-1951, 73 y.o.   MRN: [id number]  This patient is appearing on a report for the adherence measure for cholesterol (statin) medications this calendar year.   Medication: rosuvastatin  5 mg Last fill date: 05/16/2024 for 90 day supply  Outreach to patient today to discuss medication adherence. Reports that he has self-discontinued his rosuvastatin  as feels like he does not need it for now. Reports instead has been working on weight loss and reducing fat in his diet (including eating leaner meats).  Discuss role of statin therapy in cholesterol control/ASCVD risk reduction when needed  From review of chart, note that patient completed Cardiac CT Scan as ordered by PCP on 04/25/2024 and result was normal. Patient scheduled for follow up appointment with PCP on 10/30/2024 (lab work, including lipid panel on 10/29)  Encourage patient to continue to work on positive lifestyle changes, including reducing intake of saturated and trans fats in his diet.   Will send message to PCP to follow up.   Sharyle Sia, PharmD, [facility] Clinical Pharmacist [facility] [phone]

## 2024-10-15 ENCOUNTER — Other Ambulatory Visit

## 2024-10-15 DIAGNOSIS — Z0000 Encounter for general adult medical examination without abnormal findings: Secondary | ICD-10-CM

## 2024-10-15 DIAGNOSIS — E7800 Pure hypercholesterolemia, unspecified: Secondary | ICD-10-CM

## 2024-10-15 DIAGNOSIS — E66811 Obesity, class 1: Secondary | ICD-10-CM

## 2024-10-15 DIAGNOSIS — R7309 Other abnormal glucose: Secondary | ICD-10-CM

## 2024-10-15 DIAGNOSIS — N138 Other obstructive and reflux uropathy: Secondary | ICD-10-CM

## 2024-10-16 LAB — LIPID PANEL
Cholesterol: 223 mg/dL — ABNORMAL HIGH (ref <200)
HDL: 68 mg/dL (ref >=40)
LDL Cholesterol (Calc): 137 mg/dL — ABNORMAL HIGH
Non-HDL Cholesterol (Calc): 155 mg/dL — ABNORMAL HIGH (ref <130)
Total CHOL/HDL Ratio: 3.3 (calc) (ref <5.0)
Triglycerides: 79 mg/dL (ref <150)

## 2024-10-16 LAB — COMPREHENSIVE METABOLIC PANEL WITH GFR
AG Ratio: 1.3 (calc) (ref 1.0–2.5)
ALT: 17 U/L (ref 9–46)
AST: 21 U/L (ref 10–35)
Albumin: 4.4 g/dL (ref 3.6–5.1)
Alkaline phosphatase (APISO): 68 U/L (ref 35–144)
BUN: 12 mg/dL (ref 7–25)
CO2: 28 mmol/L (ref 20–32)
Calcium: 9.8 mg/dL (ref 8.6–10.3)
Chloride: 103 mmol/L (ref 98–110)
Creat: 0.88 mg/dL (ref 0.70–1.28)
Globulin: 3.4 g/dL (ref 1.9–3.7)
Glucose, Bld: 86 mg/dL (ref 65–99)
Potassium: 4.2 mmol/L (ref 3.5–5.3)
Sodium: 140 mmol/L (ref 135–146)
Total Bilirubin: 0.5 mg/dL (ref 0.2–1.2)
Total Protein: 7.8 g/dL (ref 6.1–8.1)
eGFR: 92 mL/min/1.73m2 (ref >=60)

## 2024-10-16 LAB — CBC WITH DIFFERENTIAL/PLATELET
Absolute Lymphocytes: 2044 {cells}/uL (ref 850–3900)
Absolute Monocytes: 369 {cells}/uL (ref 200–950)
Basophils Absolute: 38 {cells}/uL (ref 0–200)
Basophils Relative: 1 %
Eosinophils Absolute: 91 {cells}/uL (ref 15–500)
Eosinophils Relative: 2.4 %
HCT: 48.9 % (ref 38.5–50.0)
Hemoglobin: 15.2 g/dL (ref 13.2–17.1)
MCH: 24.5 pg — ABNORMAL LOW (ref 27.0–33.0)
MCHC: 31.1 g/dL — ABNORMAL LOW (ref 32.0–36.0)
MCV: 78.9 fL — ABNORMAL LOW (ref 80.0–100.0)
MPV: 11.6 fL (ref 7.5–12.5)
Monocytes Relative: 9.7 %
Neutro Abs: 1258 {cells}/uL — ABNORMAL LOW (ref 1500–7800)
Neutrophils Relative %: 33.1 %
Platelets: 175 Thousand/uL (ref 140–400)
RBC: 6.2 Million/uL — ABNORMAL HIGH (ref 4.20–5.80)
RDW: 15 % (ref 11.0–15.0)
Total Lymphocyte: 53.8 %
WBC: 3.8 Thousand/uL (ref 3.8–10.8)

## 2024-10-16 LAB — HEMOGLOBIN A1C
Hgb A1c MFr Bld: 5.7 % — ABNORMAL HIGH (ref <5.7)
Mean Plasma Glucose: 117 mg/dL
eAG (mmol/L): 6.5 mmol/L

## 2024-10-16 LAB — TSH: TSH: 3.77 m[IU]/L (ref 0.40–4.50)

## 2024-10-30 ENCOUNTER — Encounter: Payer: Self-pay | Admitting: Family Medicine

## 2024-10-30 ENCOUNTER — Ambulatory Visit (INDEPENDENT_AMBULATORY_CARE_PROVIDER_SITE_OTHER): Admitting: Family Medicine

## 2024-10-30 VITALS — BP 132/78 | HR 51 | Ht 68.0 in | Wt 210.2 lb

## 2024-10-30 DIAGNOSIS — Z23 Encounter for immunization: Secondary | ICD-10-CM

## 2024-10-30 DIAGNOSIS — F5101 Primary insomnia: Secondary | ICD-10-CM

## 2024-10-30 DIAGNOSIS — E7800 Pure hypercholesterolemia, unspecified: Secondary | ICD-10-CM

## 2024-10-30 DIAGNOSIS — R7309 Other abnormal glucose: Secondary | ICD-10-CM

## 2024-10-30 DIAGNOSIS — N401 Enlarged prostate with lower urinary tract symptoms: Secondary | ICD-10-CM

## 2024-10-30 DIAGNOSIS — Z0000 Encounter for general adult medical examination without abnormal findings: Secondary | ICD-10-CM

## 2024-10-30 DIAGNOSIS — E66811 Obesity, class 1: Secondary | ICD-10-CM

## 2024-10-30 DIAGNOSIS — N138 Other obstructive and reflux uropathy: Secondary | ICD-10-CM

## 2024-10-30 DIAGNOSIS — R972 Elevated prostate specific antigen [PSA]: Secondary | ICD-10-CM

## 2024-10-30 NOTE — Progress Notes (Signed)
 Subjective:    Patient ID: Walter Hebert, male    DOB: 1951/09/20, 73 y.o.   MRN: [id number]  Walter Hebert is a 73 y.o. male presenting on 10/30/2024 for Annual Exam   HPI  Discussed the use of AI scribe software for clinical note transcription with the patient, who gave verbal consent to proceed.  History of Present Illness   Walter Hebert is a 73 year old male who presents for an annual physical exam.  Insomnia He previously experienced difficulty falling asleep, which was discussed in December. He reports a significant improvement in his sleep without the need for continued use of melatonin, which he stopped using shortly after the last visit. - Now sleeping well without any issues at all - He feels rested. 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 Prostate biopsy has shown high grade intraepithelial neoplasia but no confirmed cancer Prior MRI PSA + DRE q 6 months   HYPERLIPIDEMIA Last lipid 10/2024 with LDL up to 137, slight increase, no longer taking Statin. Failed Rosuvastatin  5mg  low dose intermittent due to myalgia - Dietary changes include switching from deer meat to smoked turkey and chicken, believed to have positively impacted cholesterol levels.    The patient reports improved sleep recently and denies any current issues with muscle tension or discomfort. He has not been taking his prescribed muscle relaxant regularly due to this improvement.   Health Maintenance: Flu Shot today   Colonoscopy done 2016, next due in 2026   Decline Shingrix COVID vaccines  UTD Prevnar-20       10/30/2024    8:09 AM 04/15/2024     8:12 AM 02/08/2024   11:36 AM  Depression screen PHQ 2/9  Decreased Interest 0 0 0  Down, Depressed, Hopeless 0 0 0  PHQ - 2 Score 0 0 0  Altered sleeping 0 0 0  Tired, decreased energy 0 0 0  Change in appetite 0 0 0  Feeling bad or failure about yourself  0 0 0  Trouble concentrating 0 0 0  Moving slowly or fidgety/restless 0 0 0  Suicidal thoughts 0 0 0  PHQ-9 Score 0 0  0   Difficult doing work/chores   Not difficult at all     Data saved with a previous flowsheet row definition       10/30/2024    8:09 AM 04/15/2024    8:12 AM 11/27/2023    4:11 PM 10/15/2023    8:27 AM  GAD 7 : Generalized Anxiety Score  Nervous, Anxious, on Edge 0 0 0 0  Control/stop worrying 0 0 0 0  Worry too much - different things 0 0 0 0  Trouble relaxing 0 0 0 0  Restless 0 0 0 0  Easily annoyed or irritable 0 0 0 0  Afraid - awful might happen 0 0 0 0  Total GAD 7 Score 0 0 0 0     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 on biopsy may 2015  Night sweats    Synovial cyst    Past Surgical History:  Procedure Laterality Date   COLONOSCOPY N/A 05/18/2015   Procedure: COLONOSCOPY;  Surgeon: Rogelia Copping, MD;  Location: [facility] ENDOSCOPY;  Service: Endoscopy;  Laterality: N/A;   Social History   Socioeconomic History   Marital status: Married    Spouse name: Not on file   Number of children: Not on file   Years of education: Not on file   Highest education level: Not on file  Occupational History   Not on file  Tobacco Use   Smoking status: Never   Smokeless tobacco: Never  Vaping Use   Vaping status: Never Used  Substance and Sexual Activity   Alcohol use: No    Comment: Quit- 1990   Drug use: No   Sexual activity: Not on file  Other Topics Concern   Not on file  Social History Narrative   Not on file   Social Drivers of Health   Financial Resource Strain: Low Risk   (02/08/2024)   Overall Financial Resource Strain (CARDIA)    Difficulty of Paying Living Expenses: Not hard at all  Food Insecurity: No Food Insecurity (02/08/2024)   Hunger Vital Sign    Worried About Running Out of Food in the Last Year: Never true    Ran Out of Food in the Last Year: Never true  Transportation Needs: No Transportation Needs (02/08/2024)   PRAPARE - Administrator, Civil Service (Medical): No    Lack of Transportation (Non-Medical): No  Physical Activity: Sufficiently Active (02/08/2024)   Exercise Vital Sign    Days of Exercise per Week: 3 days    Minutes of Exercise per Session: 50 min  Stress: No Stress Concern Present (02/08/2024)   [employer] of Occupational Health - Occupational Stress Questionnaire    Feeling of Stress : Not at all  Social Connections: Socially Integrated (02/08/2024)   Social Connection and Isolation Panel    Frequency of Communication with Friends and Family: More than three times a week    Frequency of Social Gatherings with Friends and Family: Once a week    Attends Religious Services: More than 4 times per year    Active Member of [employer] or Organizations: Yes    Attends Engineer, Structural: More than 4 times per year    Marital Status: Married  Catering Manager Violence: Not At Risk (02/08/2024)   Humiliation, Afraid, Rape, and Kick questionnaire    Fear of Current or Ex-Partner: No    Emotionally Abused: No    Physically Abused: No    Sexually Abused: No   Family History  Problem Relation Age of Onset   Hyperlipidemia Other    Prostate cancer Brother        DECEASED   Healthy Father    Healthy Mother    Kidney disease Neg Hx    Current Outpatient Medications on File Prior to Visit  Medication Sig   sildenafil  (VIAGRA ) 100 MG tablet Take 1 tablet (100 mg total) by mouth daily as needed for erectile dysfunction. Take two hours prior to intercourse on an empty stomach   No current facility-administered  medications on file prior to visit.    Review of Systems  Constitutional:  Negative for activity change, appetite change, chills, diaphoresis, fatigue and fever.  HENT:  Negative for congestion and hearing loss.   Eyes:  Negative for visual disturbance.  Respiratory:  Negative for cough, chest tightness, shortness of  breath and wheezing.   Cardiovascular:  Negative for chest pain, palpitations and leg swelling.  Gastrointestinal:  Negative for abdominal pain, constipation, diarrhea, nausea and vomiting.  Genitourinary:  Negative for dysuria, frequency and hematuria.  Musculoskeletal:  Negative for arthralgias and neck pain.  Skin:  Negative for rash.  Neurological:  Negative for dizziness, weakness, light-headedness, numbness and headaches.  Hematological:  Negative for adenopathy.  Psychiatric/Behavioral:  Negative for behavioral problems, dysphoric mood and sleep disturbance.    Per HPI unless specifically indicated above     Objective:    BP 132/78 (BP Location: Right Arm, Patient Position: Sitting, Cuff Size: Normal)   Pulse (!) 51   Ht 5' 8 (1.727 m)   Wt 210 lb 4 oz (95.4 kg)   SpO2 98%   BMI 31.97 kg/m   Wt Readings from Last 3 Encounters:  10/30/24 210 lb 4 oz (95.4 kg)  05/30/24 204 lb (92.5 kg)  04/15/24 210 lb 4 oz (95.4 kg)    Physical Exam Vitals and nursing note reviewed.  Constitutional:      General: He is not in acute distress.    Appearance: He is well-developed. He is not diaphoretic.     Comments: Well-appearing, comfortable, cooperative  HENT:     Head: Normocephalic and atraumatic.  Eyes:     General:        Right eye: No discharge.        Left eye: No discharge.     Conjunctiva/sclera: Conjunctivae normal.     Pupils: Pupils are equal, round, and reactive to light.  Neck:     Thyroid : No thyromegaly.     Vascular: No carotid bruit.  Cardiovascular:     Rate and Rhythm: Normal rate and regular rhythm.     Pulses: Normal pulses.     Heart  sounds: Normal heart sounds. No murmur heard. Pulmonary:     Effort: Pulmonary effort is normal. No respiratory distress.     Breath sounds: Normal breath sounds. No wheezing or rales.  Abdominal:     General: Bowel sounds are normal. There is no distension.     Palpations: Abdomen is soft. There is no mass.     Tenderness: There is no abdominal tenderness.  Musculoskeletal:        General: No tenderness. Normal range of motion.     Cervical back: Normal range of motion and neck supple.     Right lower leg: No edema.     Left lower leg: No edema.     Comments: Upper / Lower Extremities: - Normal muscle tone, strength bilateral upper extremities 5/5, lower extremities 5/5  Lymphadenopathy:     Cervical: No cervical adenopathy.  Skin:    General: Skin is warm and dry.     Findings: No erythema or rash.  Neurological:     Mental Status: He is alert and oriented to person, place, and time.     Comments: Distal sensation intact to light touch all extremities  Psychiatric:        Mood and Affect: Mood normal.        Behavior: Behavior normal.        Thought Content: Thought content normal.     Comments: Well groomed, good eye contact, normal speech and thoughts     I have personally reviewed the radiology report from 04/25/24 on CT Coronary Calcium  Score.  ADDENDUM REPORT: 04/28/2024 23:31   EXAM: OVER-READ INTERPRETATION  CT CHEST   The following report  is an over-read performed by radiologist Dr. Oneil Devonshire of [facility] on 04/28/2024. This over-read does not include interpretation of cardiac or coronary anatomy or pathology. The coronary calcium  score interpretation by the cardiologist is attached.   COMPARISON:  None.   FINDINGS: Cardiovascular: There are no significant extracardiac vascular findings.   Mediastinum/Nodes: There are no enlarged lymph nodes within the visualized mediastinum.   Lungs/Pleura: There is no pleural effusion. The visualized  lungs appear clear.   Upper abdomen: No significant findings in the visualized upper abdomen.   Musculoskeletal/Chest wall: No chest wall mass or suspicious osseous findings within the visualized chest.   IMPRESSION: No significant extracardiac findings within the visualized chest.     Electronically Signed   By: Oneil Devonshire M.D.   On: 04/28/2024 23:31    Addended by Devonshire Oneil, MD on 04/28/2024 11:34 PM    Study Result  Narrative & Impression  CLINICAL DATA:  Risk stratification   EXAM: Coronary Calcium  Score   TECHNIQUE: The patient was scanned on a Siemens Somatom scanner. Axial non-contrast 3 mm slices were carried out through the heart. The data set was analyzed on a dedicated work station and scored using the Agatson method.   FINDINGS: Non-cardiac: See separate report from [facility].   Ascending Aorta: Normal size   Pericardium: Normal   Coronary arteries: Normal origin of left and right coronary arteries. Distribution of arterial calcifications if present, as noted below;   LM 0   LAD 5.34   LCx 0   RCA 0   Total 5.34   IMPRESSION AND RECOMMENDATION: 1. Coronary calcium  score of 5.34.   2. CAC 1-99 in LAD. CAC-DRS A1/N1.   3. Continue heart healthy lifestyle and risk factor modification.   Electronically Signed: By: Redell Cave M.D. On: 04/25/2024 16:55     Results for orders placed or performed in visit on 10/15/24  Comprehensive metabolic panel with GFR   Collection Time: 10/15/24  7:58 AM  Result Value Ref Range   Glucose, Bld 86 65 - 99 mg/dL   BUN 12 7 - 25 mg/dL   Creat 9.11 9.29 - 8.71 mg/dL   eGFR 92 > OR = 60 fO/fpw/8.26f7   BUN/Creatinine Ratio SEE NOTE: 6 - 22 (calc)   Sodium 140 135 - 146 mmol/L   Potassium 4.2 3.5 - 5.3 mmol/L   Chloride 103 98 - 110 mmol/L   CO2 28 20 - 32 mmol/L   Calcium  9.8 8.6 - 10.3 mg/dL   Total Protein 7.8 6.1 - 8.1 g/dL   Albumin 4.4 3.6 - 5.1 g/dL   Globulin 3.4 1.9 -  3.7 g/dL (calc)   AG Ratio 1.3 1.0 - 2.5 (calc)   Total Bilirubin 0.5 0.2 - 1.2 mg/dL   Alkaline phosphatase (APISO) 68 35 - 144 U/L   AST 21 10 - 35 U/L   ALT 17 9 - 46 U/L  CBC with Differential/Platelet   Collection Time: 10/15/24  7:58 AM  Result Value Ref Range   WBC 3.8 3.8 - 10.8 Thousand/uL   RBC 6.20 (H) 4.20 - 5.80 Million/uL   Hemoglobin 15.2 13.2 - 17.1 g/dL   HCT 51.0 61.4 - 49.9 %   MCV 78.9 (L) 80.0 - 100.0 fL   MCH 24.5 (L) 27.0 - 33.0 pg   MCHC 31.1 (L) 32.0 - 36.0 g/dL   RDW 84.9 88.9 - 84.9 %   Platelets 175 140 - 400 Thousand/uL   MPV 11.6 7.5 -  12.5 fL   Neutro Abs 1,258 (L) 1,500 - 7,800 cells/uL   Absolute Lymphocytes 2,044 850 - 3,900 cells/uL   Absolute Monocytes 369 200 - 950 cells/uL   Eosinophils Absolute 91 15 - 500 cells/uL   Basophils Absolute 38 0 - 200 cells/uL   Neutrophils Relative % 33.1 %   Total Lymphocyte 53.8 %   Monocytes Relative 9.7 %   Eosinophils Relative 2.4 %   Basophils Relative 1.0 %  Hemoglobin A1c   Collection Time: 10/15/24  7:58 AM  Result Value Ref Range   Hgb A1c MFr Bld 5.7 (H) <5.7 %   Mean Plasma Glucose 117 mg/dL   eAG (mmol/L) 6.5 mmol/L  Lipid panel   Collection Time: 10/15/24  7:58 AM  Result Value Ref Range   Cholesterol 223 (H) <200 mg/dL   HDL 68 > OR = 40 mg/dL   Triglycerides 79 <849 mg/dL   LDL Cholesterol (Calc) 137 (H) mg/dL (calc)   Total CHOL/HDL Ratio 3.3 <5.0 (calc)   Non-HDL Cholesterol (Calc) 155 (H) <130 mg/dL (calc)  TSH   Collection Time: 10/15/24  7:58 AM  Result Value Ref Range   TSH 3.77 0.40 - 4.50 mIU/L      Assessment & Plan:   Problem List Items Addressed This Visit     BPH with obstruction/lower urinary tract symptoms   Elevated hemoglobin A1c   Elevated PSA   Hyperlipidemia   Insomnia   Obesity (BMI 30.0-34.9)   Other Visit Diagnoses       Annual physical exam    -  Primary     Needs flu shot       Relevant Orders   Flu vaccine HIGH DOSE PF(Fluzone Trivalent)  (Completed)        Updated Health Maintenance information Reviewed recent lab results with patient Encouraged improvement to lifestyle with diet and exercise Goal of weight loss   Adult Wellness Visit Annual wellness visit conducted. Overall health is stable. - Scheduled follow-up appointment in six months.  Immunization management Discussed flu shot and COVID booster. Pneumonia vaccine was administered in April. Shingles vaccine is optional and available at the pharmacy. - Administered flu shot today. - Discussed optional shingles and COVID booster vaccines.  Benign prostatic hyperplasia with elevated prostate specific antigen under urology surveillance Under urology surveillance. Prior biopsy benign and MRI Prostate - Continue urology surveillance every six months.PSA and DRE  Pure hypercholesterolemia Cholesterol levels are stable at 137 mg/dL. Recent heart scan CT Coronary Calcium  showed minimal arterial buildup. No need for cholesterol medication. - Continue dietary management with focus on fresh vegetables and lean meats.  Obesity, class 1 Weight is stable with minor fluctuations. Lifestyle modifications are contributing to weight stability. - Continue current lifestyle modifications.  Abnormal glucose A1c improved to 5.7%, indicating better glucose control. Dietary changes are contributing to improved glucose levels. - Continue dietary modifications to maintain glucose control.         Orders Placed This Encounter  Procedures   Flu vaccine HIGH DOSE PF(Fluzone Trivalent)    No orders of the defined types were placed in this encounter.    Follow up plan: Return in about 6 months (around 04/29/2025) for 6 month PreDM A1c.  Marsa Officer, DO [facility] 10/30/2024, 8:33 AM

## 2024-10-30 NOTE — Patient Instructions (Addendum)
 Thank you for coming to the office today.  Keep up the good work!  Overall Heart Scan was excellent.  No rx cholesterol medication required.  Recent Labs    04/15/24 0817 10/15/24 0758  HGBA1C 5.8* 5.7*   Improving blood sugar.  Flu Shot today   Please schedule a Follow-up Appointment to: Return in about 6 months (around 04/29/2025) for 6 month PreDM A1c.  If you have any other questions or concerns, please feel free to call the office or send a message through MyChart. You may also schedule an earlier appointment if necessary.  Additionally, you may be receiving a survey about your experience at our office within a few days to 1 week by e-mail or mail. We value your feedback.  Marsa Officer, DO [facility], [place]

## 2024-11-24 ENCOUNTER — Other Ambulatory Visit

## 2024-11-24 DIAGNOSIS — R972 Elevated prostate specific antigen [PSA]: Secondary | ICD-10-CM

## 2024-11-25 LAB — PSA: Prostate Specific Ag, Serum: 10.9 ng/mL — ABNORMAL HIGH (ref 0.0–4.0)

## 2024-11-28 ENCOUNTER — Ambulatory Visit: Admitting: Urology

## 2025-02-13 ENCOUNTER — Ambulatory Visit: Payer: Medicare Other

## 2025-02-18 ENCOUNTER — Ambulatory Visit

## 2025-04-29 ENCOUNTER — Ambulatory Visit: Admitting: Family Medicine
